# Patient Record
Sex: Male | Born: 1945 | Hispanic: Yes | Marital: Married | Smoking: Former smoker
Health system: Southern US, Community
[De-identification: ages and names within clinical notes are randomized; demographics above are authoritative.]

## PROBLEM LIST (undated history)

## (undated) DIAGNOSIS — T4145XA Adverse effect of unspecified anesthetic, initial encounter: Secondary | ICD-10-CM

## (undated) DIAGNOSIS — Z87442 Personal history of urinary calculi: Secondary | ICD-10-CM

## (undated) DIAGNOSIS — R350 Frequency of micturition: Secondary | ICD-10-CM

## (undated) DIAGNOSIS — Z87898 Personal history of other specified conditions: Secondary | ICD-10-CM

## (undated) DIAGNOSIS — H538 Other visual disturbances: Secondary | ICD-10-CM

## (undated) DIAGNOSIS — R569 Unspecified convulsions: Secondary | ICD-10-CM

## (undated) DIAGNOSIS — T8859XA Other complications of anesthesia, initial encounter: Secondary | ICD-10-CM

## (undated) DIAGNOSIS — E782 Mixed hyperlipidemia: Secondary | ICD-10-CM

## (undated) DIAGNOSIS — K219 Gastro-esophageal reflux disease without esophagitis: Secondary | ICD-10-CM

## (undated) DIAGNOSIS — R972 Elevated prostate specific antigen [PSA]: Secondary | ICD-10-CM

## (undated) DIAGNOSIS — M1990 Unspecified osteoarthritis, unspecified site: Secondary | ICD-10-CM

## (undated) DIAGNOSIS — E039 Hypothyroidism, unspecified: Secondary | ICD-10-CM

## (undated) DIAGNOSIS — R351 Nocturia: Secondary | ICD-10-CM

## (undated) DIAGNOSIS — E119 Type 2 diabetes mellitus without complications: Secondary | ICD-10-CM

## (undated) HISTORY — PX: COLONOSCOPY W/ POLYPECTOMY: SHX1380

## (undated) HISTORY — PX: TONSILLECTOMY: SUR1361

---

## 2004-02-12 ENCOUNTER — Ambulatory Visit: Payer: Self-pay | Admitting: Internal Medicine

## 2004-02-17 ENCOUNTER — Ambulatory Visit: Payer: Self-pay | Admitting: Internal Medicine

## 2004-02-17 ENCOUNTER — Ambulatory Visit: Payer: Self-pay | Admitting: *Deleted

## 2004-02-17 ENCOUNTER — Ambulatory Visit (HOSPITAL_COMMUNITY): Admission: RE | Admit: 2004-02-17 | Discharge: 2004-02-17 | Payer: Self-pay | Admitting: Internal Medicine

## 2004-03-02 ENCOUNTER — Ambulatory Visit: Payer: Self-pay | Admitting: Internal Medicine

## 2004-06-21 ENCOUNTER — Ambulatory Visit: Payer: Self-pay | Admitting: Internal Medicine

## 2004-06-28 ENCOUNTER — Ambulatory Visit: Payer: Self-pay | Admitting: Internal Medicine

## 2004-09-07 ENCOUNTER — Ambulatory Visit: Payer: Self-pay | Admitting: Internal Medicine

## 2004-11-09 ENCOUNTER — Ambulatory Visit: Payer: Self-pay | Admitting: Internal Medicine

## 2004-12-12 ENCOUNTER — Emergency Department (HOSPITAL_COMMUNITY): Admission: EM | Admit: 2004-12-12 | Discharge: 2004-12-12 | Payer: Self-pay | Admitting: Emergency Medicine

## 2004-12-21 ENCOUNTER — Ambulatory Visit: Payer: Self-pay | Admitting: Internal Medicine

## 2005-01-05 ENCOUNTER — Ambulatory Visit: Payer: Self-pay | Admitting: Internal Medicine

## 2005-01-11 ENCOUNTER — Ambulatory Visit: Payer: Self-pay | Admitting: Internal Medicine

## 2005-05-16 ENCOUNTER — Ambulatory Visit: Payer: Self-pay | Admitting: Internal Medicine

## 2005-12-29 ENCOUNTER — Ambulatory Visit: Payer: Self-pay | Admitting: Internal Medicine

## 2006-01-02 ENCOUNTER — Ambulatory Visit: Payer: Self-pay | Admitting: Internal Medicine

## 2007-01-31 ENCOUNTER — Telehealth (INDEPENDENT_AMBULATORY_CARE_PROVIDER_SITE_OTHER): Payer: Self-pay | Admitting: *Deleted

## 2007-02-01 ENCOUNTER — Telehealth (INDEPENDENT_AMBULATORY_CARE_PROVIDER_SITE_OTHER): Payer: Self-pay | Admitting: Internal Medicine

## 2007-02-04 DIAGNOSIS — M1990 Unspecified osteoarthritis, unspecified site: Secondary | ICD-10-CM | POA: Insufficient documentation

## 2007-02-04 DIAGNOSIS — E039 Hypothyroidism, unspecified: Secondary | ICD-10-CM | POA: Insufficient documentation

## 2007-02-04 DIAGNOSIS — M2550 Pain in unspecified joint: Secondary | ICD-10-CM | POA: Insufficient documentation

## 2007-02-11 ENCOUNTER — Ambulatory Visit: Payer: Self-pay | Admitting: Nurse Practitioner

## 2007-02-11 DIAGNOSIS — L80 Vitiligo: Secondary | ICD-10-CM

## 2007-02-11 LAB — CONVERTED CEMR LAB
ALT: 37 units/L (ref 0–53)
AST: 26 units/L (ref 0–37)
Albumin: 4.6 g/dL (ref 3.5–5.2)
Alkaline Phosphatase: 87 units/L (ref 39–117)
BUN: 15 mg/dL (ref 6–23)
Basophils Absolute: 0 10*3/uL (ref 0.0–0.1)
Basophils Relative: 0 % (ref 0–1)
CO2: 25 meq/L (ref 19–32)
Calcium: 9.2 mg/dL (ref 8.4–10.5)
Chloride: 105 meq/L (ref 96–112)
Cholesterol: 204 mg/dL — ABNORMAL HIGH (ref 0–200)
Creatinine, Ser: 0.87 mg/dL (ref 0.40–1.50)
Eosinophils Absolute: 0.1 10*3/uL (ref 0.0–0.7)
Eosinophils Relative: 2 % (ref 0–5)
Glucose, Bld: 79 mg/dL (ref 70–99)
HCT: 51 % (ref 39.0–52.0)
HDL: 33 mg/dL — ABNORMAL LOW (ref >39)
Hemoglobin: 16.1 g/dL (ref 13.0–17.0)
LDL Cholesterol: 121 mg/dL — ABNORMAL HIGH (ref 0–99)
Lymphocytes Relative: 34 % (ref 12–46)
Lymphs Abs: 2.3 10*3/uL (ref 0.7–3.3)
MCHC: 31.6 g/dL (ref 30.0–36.0)
MCV: 97.9 fL (ref 78.0–100.0)
Monocytes Absolute: 0.5 10*3/uL (ref 0.2–0.7)
Monocytes Relative: 7 % (ref 3–11)
Neutro Abs: 3.9 10*3/uL (ref 1.7–7.7)
Neutrophils Relative %: 57 % (ref 43–77)
Platelets: 238 10*3/uL (ref 150–400)
Potassium: 4.4 meq/L (ref 3.5–5.3)
RBC: 5.21 M/uL (ref 4.22–5.81)
RDW: 12.4 % (ref 11.5–14.0)
Sodium: 143 meq/L (ref 135–145)
TSH: 1.739 microintl units/mL (ref 0.350–5.50)
Total Bilirubin: 0.5 mg/dL (ref 0.3–1.2)
Total CHOL/HDL Ratio: 6.2
Total Protein: 7.2 g/dL (ref 6.0–8.3)
Triglycerides: 250 mg/dL — ABNORMAL HIGH (ref <150)
VLDL: 50 mg/dL — ABNORMAL HIGH (ref 0–40)
WBC: 6.7 10*3/uL (ref 4.0–10.5)

## 2007-02-18 ENCOUNTER — Ambulatory Visit: Payer: Self-pay | Admitting: Nurse Practitioner

## 2007-02-18 DIAGNOSIS — K219 Gastro-esophageal reflux disease without esophagitis: Secondary | ICD-10-CM

## 2007-02-18 LAB — CONVERTED CEMR LAB
Cholesterol, target level: 200 mg/dL
HDL goal, serum: 40 mg/dL
LDL Goal: 130 mg/dL

## 2007-03-04 ENCOUNTER — Telehealth (INDEPENDENT_AMBULATORY_CARE_PROVIDER_SITE_OTHER): Payer: Self-pay | Admitting: Nurse Practitioner

## 2007-10-11 ENCOUNTER — Encounter (INDEPENDENT_AMBULATORY_CARE_PROVIDER_SITE_OTHER): Payer: Self-pay | Admitting: Nurse Practitioner

## 2008-04-02 ENCOUNTER — Ambulatory Visit: Payer: Self-pay | Admitting: Nurse Practitioner

## 2008-04-02 DIAGNOSIS — L719 Rosacea, unspecified: Secondary | ICD-10-CM | POA: Insufficient documentation

## 2008-04-03 LAB — CONVERTED CEMR LAB
ALT: 51 units/L (ref 0–53)
AST: 35 units/L (ref 0–37)
Albumin: 5.2 g/dL (ref 3.5–5.2)
Alkaline Phosphatase: 87 units/L (ref 39–117)
BUN: 14 mg/dL (ref 6–23)
Basophils Absolute: 0 10*3/uL (ref 0.0–0.1)
Basophils Relative: 0 % (ref 0–1)
CO2: 28 meq/L (ref 19–32)
Calcium: 10 mg/dL (ref 8.4–10.5)
Chloride: 103 meq/L (ref 96–112)
Cholesterol: 253 mg/dL — ABNORMAL HIGH (ref 0–200)
Creatinine, Ser: 1.12 mg/dL (ref 0.40–1.50)
Eosinophils Absolute: 0.1 10*3/uL (ref 0.0–0.7)
Eosinophils Relative: 2 % (ref 0–5)
Glucose, Bld: 104 mg/dL — ABNORMAL HIGH (ref 70–99)
HCT: 49.6 % (ref 39.0–52.0)
HDL: 35 mg/dL — ABNORMAL LOW (ref >39)
Hemoglobin: 16.5 g/dL (ref 13.0–17.0)
LDL Cholesterol: 146 mg/dL — ABNORMAL HIGH (ref 0–99)
Lymphocytes Relative: 35 % (ref 12–46)
Lymphs Abs: 2.6 10*3/uL (ref 0.7–4.0)
MCHC: 33.3 g/dL (ref 30.0–36.0)
MCV: 94.8 fL (ref 78.0–100.0)
Monocytes Absolute: 0.5 10*3/uL (ref 0.1–1.0)
Monocytes Relative: 6 % (ref 3–12)
Neutro Abs: 4.1 10*3/uL (ref 1.7–7.7)
Neutrophils Relative %: 56 % (ref 43–77)
PSA: 2.08 ng/mL (ref 0.10–4.00)
Platelets: 211 10*3/uL (ref 150–400)
Potassium: 5.1 meq/L (ref 3.5–5.3)
RBC: 5.23 M/uL (ref 4.22–5.81)
RDW: 13.5 % (ref 11.5–15.5)
Sodium: 144 meq/L (ref 135–145)
TSH: 25.857 microintl units/mL — ABNORMAL HIGH (ref 0.350–4.50)
Total Bilirubin: 0.7 mg/dL (ref 0.3–1.2)
Total CHOL/HDL Ratio: 7.2
Total Protein: 8.1 g/dL (ref 6.0–8.3)
Triglycerides: 359 mg/dL — ABNORMAL HIGH (ref <150)
VLDL: 72 mg/dL — ABNORMAL HIGH (ref 0–40)
WBC: 7.3 10*3/uL (ref 4.0–10.5)

## 2008-08-06 ENCOUNTER — Encounter (INDEPENDENT_AMBULATORY_CARE_PROVIDER_SITE_OTHER): Payer: Self-pay | Admitting: Nurse Practitioner

## 2008-09-29 ENCOUNTER — Ambulatory Visit: Payer: Self-pay | Admitting: Internal Medicine

## 2008-09-29 DIAGNOSIS — R7309 Other abnormal glucose: Secondary | ICD-10-CM | POA: Insufficient documentation

## 2008-09-29 DIAGNOSIS — E782 Mixed hyperlipidemia: Secondary | ICD-10-CM

## 2008-09-30 ENCOUNTER — Telehealth (INDEPENDENT_AMBULATORY_CARE_PROVIDER_SITE_OTHER): Payer: Self-pay | Admitting: *Deleted

## 2008-10-01 ENCOUNTER — Encounter (INDEPENDENT_AMBULATORY_CARE_PROVIDER_SITE_OTHER): Payer: Self-pay | Admitting: Internal Medicine

## 2008-10-02 ENCOUNTER — Ambulatory Visit: Payer: Self-pay | Admitting: Internal Medicine

## 2008-10-02 LAB — CONVERTED CEMR LAB
Anti Nuclear Antibody(ANA): NEGATIVE
Rheumatoid fact SerPl-aCnc: <20 intl units/mL (ref 0–20)
Sed Rate: 8 mm/hr (ref 0–16)

## 2008-10-07 ENCOUNTER — Telehealth (INDEPENDENT_AMBULATORY_CARE_PROVIDER_SITE_OTHER): Payer: Self-pay | Admitting: Internal Medicine

## 2008-11-13 ENCOUNTER — Encounter (INDEPENDENT_AMBULATORY_CARE_PROVIDER_SITE_OTHER): Payer: Self-pay | Admitting: Internal Medicine

## 2008-11-30 ENCOUNTER — Ambulatory Visit: Payer: Self-pay | Admitting: Internal Medicine

## 2008-11-30 LAB — CONVERTED CEMR LAB
ALT: 37 units/L (ref 0–53)
AST: 22 units/L (ref 0–37)
Albumin: 4.4 g/dL (ref 3.5–5.2)
Alkaline Phosphatase: 93 units/L (ref 39–117)
BUN: 17 mg/dL (ref 6–23)
CO2: 25 meq/L (ref 19–32)
Calcium: 8.9 mg/dL (ref 8.4–10.5)
Chloride: 104 meq/L (ref 96–112)
Cholesterol: 171 mg/dL (ref 0–200)
Creatinine, Ser: 0.92 mg/dL (ref 0.40–1.50)
Glucose, Bld: 100 mg/dL — ABNORMAL HIGH (ref 70–99)
HDL: 36 mg/dL — ABNORMAL LOW (ref >39)
LDL Cholesterol: 103 mg/dL — ABNORMAL HIGH (ref 0–99)
Potassium: 4.2 meq/L (ref 3.5–5.3)
Sodium: 142 meq/L (ref 135–145)
TSH: 4.554 microintl units/mL — ABNORMAL HIGH (ref 0.350–4.500)
Total Bilirubin: 0.4 mg/dL (ref 0.3–1.2)
Total CHOL/HDL Ratio: 4.8
Total Protein: 7.1 g/dL (ref 6.0–8.3)
Triglycerides: 158 mg/dL — ABNORMAL HIGH (ref <150)
VLDL: 32 mg/dL (ref 0–40)

## 2008-12-08 ENCOUNTER — Ambulatory Visit: Payer: Self-pay | Admitting: Internal Medicine

## 2009-11-16 ENCOUNTER — Ambulatory Visit: Payer: Self-pay | Admitting: Nurse Practitioner

## 2009-11-16 DIAGNOSIS — K5900 Constipation, unspecified: Secondary | ICD-10-CM | POA: Insufficient documentation

## 2009-11-16 LAB — CONVERTED CEMR LAB: Blood Glucose, Fingerstick: 83

## 2009-11-17 ENCOUNTER — Ambulatory Visit: Payer: Self-pay | Admitting: Nurse Practitioner

## 2009-11-17 ENCOUNTER — Encounter (INDEPENDENT_AMBULATORY_CARE_PROVIDER_SITE_OTHER): Payer: Self-pay | Admitting: Internal Medicine

## 2009-11-17 LAB — CONVERTED CEMR LAB
ALT: 81 units/L — ABNORMAL HIGH (ref 0–53)
AST: 38 units/L — ABNORMAL HIGH (ref 0–37)
Albumin: 4.9 g/dL (ref 3.5–5.2)
Alkaline Phosphatase: 104 units/L (ref 39–117)
BUN: 14 mg/dL (ref 6–23)
Basophils Absolute: 0 10*3/uL (ref 0.0–0.1)
Basophils Relative: 0 % (ref 0–1)
CO2: 29 meq/L (ref 19–32)
Calcium: 9.2 mg/dL (ref 8.4–10.5)
Chloride: 104 meq/L (ref 96–112)
Cholesterol: 227 mg/dL — ABNORMAL HIGH (ref 0–200)
Creatinine, Ser: 0.93 mg/dL (ref 0.40–1.50)
Eosinophils Absolute: 0.3 10*3/uL (ref 0.0–0.7)
Eosinophils Relative: 4 % (ref 0–5)
Glucose, Bld: 95 mg/dL (ref 70–99)
HCT: 48.4 % (ref 39.0–52.0)
HCV Ab: NEGATIVE
HDL: 35 mg/dL — ABNORMAL LOW (ref >39)
Hemoglobin: 15.9 g/dL (ref 13.0–17.0)
Hep A Total Ab: POSITIVE — AB
Hep B Core Total Ab: NEGATIVE
Hep B S Ab: NEGATIVE
LDL Cholesterol: 151 mg/dL — ABNORMAL HIGH (ref 0–99)
Lymphocytes Relative: 34 % (ref 12–46)
Lymphs Abs: 2.5 10*3/uL (ref 0.7–4.0)
MCHC: 32.9 g/dL (ref 30.0–36.0)
MCV: 92.9 fL (ref 78.0–100.0)
Monocytes Absolute: 0.5 10*3/uL (ref 0.1–1.0)
Monocytes Relative: 7 % (ref 3–12)
Neutro Abs: 4 10*3/uL (ref 1.7–7.7)
Neutrophils Relative %: 56 % (ref 43–77)
PSA: 4.15 ng/mL — ABNORMAL HIGH (ref 0.10–4.00)
Platelets: 199 10*3/uL (ref 150–400)
Potassium: 4.7 meq/L (ref 3.5–5.3)
RBC: 5.21 M/uL (ref 4.22–5.81)
RDW: 13.8 % (ref 11.5–15.5)
Sodium: 143 meq/L (ref 135–145)
TSH: 0.452 microintl units/mL (ref 0.350–4.500)
Total Bilirubin: 0.6 mg/dL (ref 0.3–1.2)
Total CHOL/HDL Ratio: 6.5
Total Protein: 7.8 g/dL (ref 6.0–8.3)
Triglycerides: 203 mg/dL — ABNORMAL HIGH (ref <150)
VLDL: 41 mg/dL — ABNORMAL HIGH (ref 0–40)
WBC: 7.2 10*3/uL (ref 4.0–10.5)

## 2010-05-24 NOTE — Letter (Signed)
Summary: Handout Printed  Printed Handout:  - Diet - High-Fiber 

## 2010-05-24 NOTE — Assessment & Plan Note (Signed)
Summary: *lLAB RESULTS PER FNP MARTIN . NO CHARGE / NS  Nurse Visit  With benefit of an interpreter, explained and discussed lab results from yesterday's visit.   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  Was advised to stop taking the fluconazole, as his liver enzymes are elevated.   Explained to pt. that the samples of Trilipix 135 mg. are to take instead of the pravastatin, since his cholesterol is still high, but because of his elevated LFTs to stop the pravastatin.  States that he occasionally has trouble urinating and gets up at night sometimes, but has had no difficulty with his stream.  Advised that his PSA is elevated since 2009 and that his next visit will need a rectal/prostate exam, which can be done when he returns for an office visit and repeat LFTs in 6 weeks  -- verbalized understanding.   Dutch Quint RN  November 17, 2009 4:27 PM    Allergies: No Known Drug Allergies  Appended Document: *lLAB RESULTS PER FNP MARTIN . NO CHARGE / NS PSA was NOT elevated at last check in 2009  Appended Document: *lLAB RESULTS PER FNP MARTIN . NO CHARGE / NS Pt. was advised that PSA was normal in 2009 and that it has since elevated as per last lab result.  Dutch Quint RN  November 22, 2009 10:51 AM

## 2010-05-24 NOTE — Letter (Signed)
Summary: Handout Printed  Printed Handout:  - Diet - Low-Cholesterol Guidelines 

## 2010-05-24 NOTE — Letter (Signed)
Summary: Handout Printed  Printed Handout:  - Hypothyroidism 

## 2010-05-24 NOTE — Assessment & Plan Note (Signed)
Summary: Hypothroidism/Hypercholesterolemia   Vital Signs:  Patient profile:   65 year old male Height:      68.0 inches Weight:      208.0 pounds BMI:     31.74 Temp:     98.0 degrees F oral Pulse rate:   65 / minute Pulse rhythm:   regular Resp:     16 per minute BP sitting:   127 / 81  (left arm) Cuff size:   large  Vitals Entered By: Levon Hedger (November 16, 2009 10:06 AM) CC: pain in joints in his legs, feet, and back...labwork, Lipid Management Is Patient Diabetic? No Pain Assessment Patient in pain? yes     Location: joints CBG Result 83 CBG Device ID B  Does patient need assistance? Functional Status Self care Ambulation Normal Comments pt has not been taking pravastin for 2 months   CC:  pain in joints in his legs, feet, and back...labwork, and Lipid Management.  History of Present Illness:  Pt into the office for f/u - He has been in [place] with his son and returned to [facility] in April  At that time pt was not feeling well - tired, fatigued. He went to his son's provider in [facility] and presents today with the lab results. 08/02/2009 - TSH 0.2333 Pt decreased the levothyroid to and was to f/u in 3 months Pt chose to return to this office as he is most familiar with the providers here.  Language line used for Spanish interpreter  Lipid Management History:      Positive NCEP/ATP III risk factors include male age 38 years old or older and HDL cholesterol less than 40.  Negative NCEP/ATP III risk factors include non-diabetic, no family history for ischemic heart disease, non-tobacco-user status, non-hypertensive, no ASHD (atherosclerotic heart disease), no prior stroke/TIA, no peripheral vascular disease, and no history of aortic aneurysm.        The patient states that he does not know about the "Therapeutic Lifestyle Change" diet.  His compliance with the TLC diet is fair.  The patient does not know about adjunctive measures for cholesterol lowering.   Adjunctive measures started by the patient include ASA.  He expresses no side effects from his lipid-lowering medication.  Comments include: Pt has only took the cholesterol medication for 1 month then he stooped taking it.  The patient denies any symptoms to suggest myopathy or liver disease.  Comments: Labs done 08/02/2009 - total 203, trig 216, LDL 126, HDL 34 .   Allergies (verified): No Known Drug Allergies  Review of Systems General:  Denies fatigue, fever, and weakness. CV:  Denies chest pain or discomfort. Resp:  Denies cough. GI:  Complains of constipation; denies abdominal pain, nausea, and vomiting; abdominal distention toward the end of the day.  Reports 1 BM per day but with straining. Derm:  Complains of changes in color of skin; Hx of vitiligo also with hx of Rosacea for which he is requesting a refill of gel.  with the extreme heat face gets really hot and swollen.  Physical Exam  General:  alert.   Head:  normocephalic.   Lungs:  normal breath sounds.   Heart:  normal rate and regular rhythm.   Abdomen:  normal bowel sounds.   Msk:  up to the exam table Neurologic:  alert & oriented X3.   Skin:  vitiligo to bilateral hands Psych:  Oriented X3.     Impression & Recommendations:  Problem # 1:  HYPOTHYROIDISM (  ICD-244.9) will check labs today. Pt hyperthyroid in April when checked at another office  He has been taking levothroid by mouth daily  The following medications were removed from the medication list:    Levothroid 25 Mcg Tabs (Levothyroxine sodium) .Marland Kitchen... 1 tab with 200 micrograms tab daily His updated medication list for this problem includes:    Levothroid 200 Mcg Tabs (Levothyroxine sodium) .Marland Kitchen... 1 tab by mouth daily for thyroid  Orders: T-TSH (16606-30160)  Problem # 2:  HYPERLIPIDEMIA, MIXED (ICD-272.2) will check labs today  expect that labs will be high because pt has not been taking medications as ordered reviewed with pt cholesterol and  importance of taking meds His updated medication list for this problem includes:    Pravastatin Sodium 20 Mg Tabs (Pravastatin sodium) .Marland Kitchen... 1 tablet by mouth nightly for cholesterol  Orders: T-Lipid Profile (10932-35573) T-Comprehensive Metabolic Panel (22025-42706) T-PSA (23762-83151) T-CBC w/Diff (76160-73710)  Problem # 3:  HYPERGLYCEMIA (ICD-790.29) stable Orders: Capillary Blood Glucose/CBG (62694)  Problem # 4:  ROSACEA (ICD-695.3) will refill gel for use to face  Problem # 5:  CONSTIPATION (ICD-564.00) advised pt on foods to start to keep stools soft pt had colonscopy 8 years ago in [place] during which time Polyps were removed.  pt is interested in having this procedure done again and will notify this office when he is ready to proceed  Complete Medication List: 1)  Levothroid 200 Mcg Tabs (Levothyroxine sodium) .Marland Kitchen.. 1 tab by mouth daily for thyroid 2)  Naproxen 500 Mg Tabs (Naproxen) .Marland Kitchen.. 1 tablet by mouth two times a day as needed for ankles 3)  Metronidazole 0.75 % Crea (Metronidazole) .... Apply to affected area two times a day 4)  Pravastatin Sodium 20 Mg Tabs (Pravastatin sodium) .Marland Kitchen.. 1 tablet by mouth nightly for cholesterol 5)  Triamcinolone Acetonide 0.1 % Oint (Triamcinolone acetonide) .... One application topically to affected area daily  Lipid Assessment/Plan:      Based on NCEP/ATP III, the patient's risk factor category is "2 or more risk factors and a calculated 10 year CAD risk of < 20%".  The patient's lipid goals are as follows: Total cholesterol goal is 200; LDL cholesterol goal is 130; HDL cholesterol goal is 40; Triglyceride goal is 150.    Patient Instructions: 1)  Return tomorrow with an appointment with Aggie Cosier for review of lab results. (NO CHARGE FOR PATIENT) 2)  Once labs are reviewed you can get your refills and will determine when your follow up appointment will be 3)  Constipation - read handout and start eating high fiber foods. 4)  Notify this  office when you are ready to schedule your colonscopy. Prescriptions: METRONIDAZOLE 0.75 % CREA (METRONIDAZOLE) apply to affected area two times a day  #45gm x 6   Entered and Authorized by:   Lehman Prom FNP   Signed by:   Lehman Prom FNP on 11/16/2009   Method used:   Print then Give to Patient   RxID:   [id number]

## 2012-07-10 ENCOUNTER — Other Ambulatory Visit: Payer: Self-pay | Admitting: Urology

## 2012-07-16 ENCOUNTER — Encounter (HOSPITAL_BASED_OUTPATIENT_CLINIC_OR_DEPARTMENT_OTHER): Payer: Self-pay | Admitting: *Deleted

## 2012-07-16 NOTE — Progress Notes (Signed)
SPOKE W/ PT THRU SPANISH INTERPRETOR , TONY BASTIAS. NPO AFTER MN. ARRIVES AT 0715. NEEDS HG. WILL TAKE SYNTHROID AM OF SURG W/ SIP OF WATER AND DO FLEET ENEMA. ARRANGED FOR SPANISH INTERPRETOR DOS THRU CARE MANAGEMENT  [phone].

## 2012-07-19 ENCOUNTER — Ambulatory Visit (HOSPITAL_BASED_OUTPATIENT_CLINIC_OR_DEPARTMENT_OTHER)
Admission: RE | Admit: 2012-07-19 | Discharge: 2012-07-19 | Disposition: A | Discharge status: Home / Self Care | Payer: Medicare Other | Source: Ambulatory Visit | Attending: Urology | Admitting: Urology

## 2012-07-19 ENCOUNTER — Encounter (HOSPITAL_BASED_OUTPATIENT_CLINIC_OR_DEPARTMENT_OTHER)
Admission: RE | Disposition: A | Discharge status: Home / Self Care | Payer: Self-pay | Source: Ambulatory Visit | Attending: Urology

## 2012-07-19 ENCOUNTER — Ambulatory Visit (HOSPITAL_BASED_OUTPATIENT_CLINIC_OR_DEPARTMENT_OTHER): Payer: Medicare Other | Admitting: Anesthesiology

## 2012-07-19 ENCOUNTER — Encounter (HOSPITAL_BASED_OUTPATIENT_CLINIC_OR_DEPARTMENT_OTHER): Payer: Self-pay | Admitting: Anesthesiology

## 2012-07-19 DIAGNOSIS — R3915 Urgency of urination: Secondary | ICD-10-CM | POA: Insufficient documentation

## 2012-07-19 DIAGNOSIS — K219 Gastro-esophageal reflux disease without esophagitis: Secondary | ICD-10-CM | POA: Insufficient documentation

## 2012-07-19 DIAGNOSIS — R350 Frequency of micturition: Secondary | ICD-10-CM | POA: Insufficient documentation

## 2012-07-19 DIAGNOSIS — C61 Malignant neoplasm of prostate: Secondary | ICD-10-CM | POA: Insufficient documentation

## 2012-07-19 DIAGNOSIS — E039 Hypothyroidism, unspecified: Secondary | ICD-10-CM | POA: Insufficient documentation

## 2012-07-19 DIAGNOSIS — E782 Mixed hyperlipidemia: Secondary | ICD-10-CM | POA: Insufficient documentation

## 2012-07-19 DIAGNOSIS — Z79899 Other long term (current) drug therapy: Secondary | ICD-10-CM | POA: Insufficient documentation

## 2012-07-19 HISTORY — DX: Frequency of micturition: R35.0

## 2012-07-19 HISTORY — DX: Nocturia: R35.1

## 2012-07-19 HISTORY — DX: Mixed hyperlipidemia: E78.2

## 2012-07-19 HISTORY — DX: Unspecified osteoarthritis, unspecified site: M19.90

## 2012-07-19 HISTORY — PX: PROSTATE BIOPSY: SHX241

## 2012-07-19 HISTORY — DX: Elevated prostate specific antigen (PSA): R97.20

## 2012-07-19 HISTORY — DX: Hypothyroidism, unspecified: E03.9

## 2012-07-19 SURGERY — BIOPSY, PROSTATE, RECTAL APPROACH, WITH US GUIDANCE
Anesthesia: Monitor Anesthesia Care | Site: Prostate | Wound class: Clean Contaminated

## 2012-07-19 MED ORDER — PROPOFOL 10 MG/ML IV EMUL
INTRAVENOUS | Status: DC | PRN
  Administered 2012-07-19: 100 ug/kg/min via INTRAVENOUS

## 2012-07-19 MED ORDER — HYDROMORPHONE HCL PF 1 MG/ML IJ SOLN
0.2500 mg | INTRAMUSCULAR | Status: DC | PRN
  Filled 2012-07-19: qty 1

## 2012-07-19 MED ORDER — FENTANYL CITRATE 0.05 MG/ML IJ SOLN
INTRAMUSCULAR | Status: DC | PRN
  Administered 2012-07-19 (×2): 25 ug via INTRAVENOUS

## 2012-07-19 MED ORDER — LIDOCAINE HCL (PF) 2 % IJ SOLN
INTRAMUSCULAR | Status: DC | PRN
  Administered 2012-07-19: 7 mL

## 2012-07-19 MED ORDER — FLEET ENEMA 7-19 GM/118ML RE ENEM
1.0000 | ENEMA | Freq: Once | RECTAL | Status: DC
  Filled 2012-07-19: qty 1

## 2012-07-19 MED ORDER — DEXAMETHASONE SODIUM PHOSPHATE 4 MG/ML IJ SOLN
INTRAMUSCULAR | Status: DC | PRN
  Administered 2012-07-19 (×2): 5 mg via INTRAVENOUS

## 2012-07-19 MED ORDER — LACTATED RINGERS IV SOLN
INTRAVENOUS | Status: DC
  Administered 2012-07-19: 100 mL/h via INTRAVENOUS
  Filled 2012-07-19: qty 1000

## 2012-07-19 MED ORDER — DEXTROSE 5 % IV SOLN
1.0000 g | Freq: Once | INTRAVENOUS | Status: DC
  Filled 2012-07-19: qty 10

## 2012-07-19 MED ORDER — LIDOCAINE HCL (CARDIAC) 20 MG/ML IV SOLN
INTRAVENOUS | Status: DC | PRN
  Administered 2012-07-19: 50 mg via INTRAVENOUS

## 2012-07-19 MED ORDER — ONDANSETRON HCL 4 MG/2ML IJ SOLN
INTRAMUSCULAR | Status: DC | PRN
  Administered 2012-07-19 (×2): 2 mg via INTRAVENOUS

## 2012-07-19 MED ORDER — MIDAZOLAM HCL 5 MG/5ML IJ SOLN
INTRAMUSCULAR | Status: DC | PRN
  Administered 2012-07-19: 2 mg via INTRAVENOUS

## 2012-07-19 MED ORDER — PROMETHAZINE HCL 25 MG/ML IJ SOLN
6.2500 mg | INTRAMUSCULAR | Status: DC | PRN
  Filled 2012-07-19: qty 1

## 2012-07-19 MED ORDER — PROPOFOL 10 MG/ML IV BOLUS
INTRAVENOUS | Status: DC | PRN
  Administered 2012-07-19: 20 mg via INTRAVENOUS
  Administered 2012-07-19: 30 mg via INTRAVENOUS

## 2012-07-19 MED ORDER — LACTATED RINGERS IV SOLN
INTRAVENOUS | Status: DC
  Filled 2012-07-19: qty 1000

## 2012-07-19 MED ORDER — DEXTROSE 5 % IV SOLN
2.0000 g | Freq: Once | INTRAVENOUS | Status: AC
  Administered 2012-07-19: 2 g via INTRAVENOUS
  Filled 2012-07-19: qty 2

## 2012-07-19 MED ORDER — LACTATED RINGERS IV SOLN
INTRAVENOUS | Status: DC | PRN
  Administered 2012-07-19: 07:00:00 via INTRAVENOUS

## 2012-07-19 MED ORDER — OXYCODONE-ACETAMINOPHEN 5-325 MG PO TABS
1.0000 | ORAL_TABLET | ORAL | Status: DC | PRN
  Filled 2012-07-19: qty 2

## 2012-07-19 SURGICAL SUPPLY — 6 items
DRESSING TELFA 8X3 (GAUZE/BANDAGES/DRESSINGS) ×2 IMPLANT
GLOVE BIO SURGEON STRL SZ7 (GLOVE) ×1 IMPLANT
GLOVE SURG SS PI 8.0 STRL IVOR (GLOVE) IMPLANT
SURGILUBE 2OZ TUBE FLIPTOP (MISCELLANEOUS) ×2 IMPLANT
TOWEL OR 17X24 6PK STRL BLUE (TOWEL DISPOSABLE) ×2 IMPLANT
UNDERPAD 30X30 INCONTINENT (UNDERPADS AND DIAPERS) ×2 IMPLANT

## 2012-07-19 NOTE — Anesthesia Postprocedure Evaluation (Signed)
Anesthesia Post Note  Patient: Geoffrey Neal  Procedure(s) Performed: Procedure(s) (LRB): EUA, PROSTATE BIOPSY TRANSRECTAL ULTRASONIC PROSTATE (TUBP) (N/A)  Anesthesia type: General  Patient location: PACU  Post pain: Pain level controlled  Post assessment: Post-op Vital signs reviewed  Last Vitals:  Filed Vitals:   07/19/12 0945  BP: 136/99  Pulse: 79  Temp:   Resp: 13    Post vital signs: Reviewed  Level of consciousness: sedated  Complications: No apparent anesthesia complications

## 2012-07-19 NOTE — Op Note (Signed)
Preoperative diagnosis: Elevated PSA Postoperative diagnosis: Elevated PSA  Procedure: Transrectal ultrasound of the prostate Prostate needle biopsy Ultrasound guidance for needle biopsy  Surgeon: Bobetta Korf  Type of anesthesia: MAC  Findings: On digital rectal exam the prostate was mildly enlarged but palpably normal without hard area or nodule. The prostate was smooth. There were no rectal masses palpated.  Prostate ultrasound - prostate was normal in appearance with a homogenous appearance. There were some faint calcifications around the urethra and ejaculatory ducts. Length 3.77 cm Height 2.74 cm With the 4.49 cm Volume 24 g  Description of procedure - After consent was obtained the patient was brought to the operating room. After adequate anesthesia he was placed in left lateral decubitus with the right side up. The knees were pulled toward the chest. A timeout was performed to confirm the patient and procedure. A digital rectal exam was performed. The ultrasound probe was inserted and a prostate ultrasound obtained.  Next the needle was guided in and 12 core biopsy was taken standard pattern left and right base, mid, apex starting lateral then medial.  The patient was then placed supine awakened and taken to the recovery room in stable condition.  Complications: None  Blood loss: Minimal  Specimens: 12 core prostate biopsy sent to pathology individually labeled.  Drains: None  Disposition: Patient stable to PACU.

## 2012-07-19 NOTE — Anesthesia Preprocedure Evaluation (Signed)
Anesthesia Evaluation  Patient identified by MRN, date of birth, ID band Patient awake    Reviewed: Allergy & Precautions, H&P , NPO status , Patient's Chart, lab work & pertinent test results  Airway Mallampati: III TM Distance: >3 FB Neck ROM: Full    Dental  (+) Poor Dentition, Partial Upper, Partial Lower, Dental Advisory Given and Chipped   Pulmonary neg pulmonary ROS,  breath sounds clear to auscultation  Pulmonary exam normal       Cardiovascular negative cardio ROS  Rhythm:Regular Rate:Normal     Neuro/Psych negative neurological ROS  negative psych ROS   GI/Hepatic Neg liver ROS, GERD-  ,  Endo/Other  Hypothyroidism   Renal/GU negative Renal ROS  negative genitourinary   Musculoskeletal negative musculoskeletal ROS (+)   Abdominal   Peds negative pediatric ROS (+)  Hematology negative hematology ROS (+)   Anesthesia Other Findings   Reproductive/Obstetrics negative OB ROS                           Anesthesia Physical Anesthesia Plan  ASA: II  Anesthesia Plan: General   Post-op Pain Management:    Induction: Intravenous  Airway Management Planned: LMA  Additional Equipment:   Intra-op Plan:   Post-operative Plan: Extubation in OR  Informed Consent: I have reviewed the patients History and Physical, chart, labs and discussed the procedure including the risks, benefits and alternatives for the proposed anesthesia with the patient or authorized representative who has indicated his/her understanding and acceptance.   Dental advisory given  Plan Discussed with: CRNA  Anesthesia Plan Comments:         Anesthesia Quick Evaluation

## 2012-07-19 NOTE — Transfer of Care (Signed)
Immediate Anesthesia Transfer of Care Note  Patient: Geoffrey Neal  Procedure(s) Performed: Procedure(s) (LRB): EUA, PROSTATE BIOPSY TRANSRECTAL ULTRASONIC PROSTATE (TUBP) (N/A)  Patient Location: PACU  Anesthesia Type: MAC  Level of Consciousness: sleepy  Airway & Oxygen Therapy: Patient Spontanous Breathing and Patient connected to face mask oxygen, oral airway placed  Post-op Assessment: Report given to PACU RN and Post -op Vital signs reviewed and stable  Post vital signs: Reviewed and stable  Complications: No apparent anesthesia complications

## 2012-07-19 NOTE — H&P (Signed)
H&P   History of Present Illness: Geoffrey Neal present with an elevated PSA and a high PSAD with a 26 g prostate on prior US. He did not tolerate US probe insertion in office despite multiple attempts.   He also has urinary frequency and urgency and desires treatment.   Past Medical History  Diagnosis Date  . Elevated PSA   . Frequency of urination   . Mixed hyperlipidemia     MILD PER PT  . Nocturia   . DJD (degenerative joint disease)   . Hypothyroidism    History reviewed. No pertinent past surgical history.  Home Medications:  Prescriptions prior to admission  Medication Sig Dispense Refill  . levofloxacin (LEVAQUIN) 500 MG tablet Take 500 mg by mouth daily.      Marland Kitchen levothyroxine (SYNTHROID, LEVOTHROID) 175 MCG tablet Take 175 mcg by mouth every morning.      . Misc Natural Products (OSTEO BI-FLEX TRIPLE STRENGTH PO) Take by mouth daily.      . Omega-3 Fatty Acids (FISH OIL PO) Take by mouth daily.      . tamsulosin (FLOMAX) 0.4 MG CAPS Take 0.4 mg by mouth daily.       Allergies: No Known Allergies  History reviewed. No pertinent family history. Social History:  reports that he has quit smoking. His smoking use included Cigarettes. He smoked 0.00 packs per day for 15 years. He has never used smokeless tobacco. He reports that he does not drink alcohol or use illicit drugs.  ROS: A complete review of systems was performed.  All systems are negative except for pertinent findings as noted. @ROS @   Physical Exam:  Vital signs in last 24 hours: Temp:  [97.5 F (36.4 C)] 97.5 F (36.4 C) (03/28 0744) Pulse Rate:  [70] 70 (03/28 0744) Resp:  [20] 20 (03/28 0744) BP: (145)/(97) 145/97 mmHg (03/28 0744) SpO2:  [95 %] 95 % (03/28 0744) Weight:  [93.583 kg (206 lb 5 oz)] 93.583 kg (206 lb 5 oz) (03/28 0744) General:  Alert and oriented, No acute distress HEENT: Normocephalic, atraumatic Neck: No JVD or lymphadenopathy Cardiovascular: Regular rate and rhythm Lungs: Regular  rate and effort Abdomen: Soft, nontender, nondistended, no abdominal masses Back: No CVA tenderness Extremities: No edema Neurologic: Grossly intact  Laboratory Data:  Results for orders placed during the hospital encounter of 07/19/12 (from the past 24 hour(s))  POCT HEMOGLOBIN-HEMACUE     Status: None   Collection Time    07/19/12  7:51 AM      Result Value Range   Hemoglobin 14.0  13.0 - 17.0 g/dL   No results found for this or any previous visit (from the past 240 hour(s)). Creatinine: No results found for this basename: CREATININE,  in the last 168 hours  Impression/Assessment:  Elevated PSA Urinary frequency and urgency  Plan:  I discussed with the patient the nature, potential benefits, risks and alternatives to TRUS prostate and prostate biopsy, including side effects of the proposed treatment, the likelihood of the patient achieving the goals of the procedure, and any potential problems that might occur during the procedure or recuperation. All questions answered. Patient elects to proceed.   In regards to his LUTS, we discussed the nature, R/B of cystoscopy and the role of cystoscopy in the evaluation of LUTS. I recommended he undergo flexible cystoscopy which we normally perform in the office, but he can take advantage of the sedation and have it now. He elects NOT to proceed with cystoscopy.  Geoffrey Neal 07/19/2012, 8:26 AM

## 2012-07-22 ENCOUNTER — Encounter (HOSPITAL_BASED_OUTPATIENT_CLINIC_OR_DEPARTMENT_OTHER): Payer: Self-pay | Admitting: Urology

## 2013-08-17 ENCOUNTER — Emergency Department (HOSPITAL_BASED_OUTPATIENT_CLINIC_OR_DEPARTMENT_OTHER)
Admission: EM | Admit: 2013-08-17 | Discharge: 2013-08-18 | Disposition: A | Discharge status: Home / Self Care | Payer: Medicare Other | Attending: Emergency Medicine | Admitting: Emergency Medicine

## 2013-08-17 ENCOUNTER — Encounter (HOSPITAL_BASED_OUTPATIENT_CLINIC_OR_DEPARTMENT_OTHER): Payer: Self-pay | Admitting: Emergency Medicine

## 2013-08-17 ENCOUNTER — Emergency Department (HOSPITAL_BASED_OUTPATIENT_CLINIC_OR_DEPARTMENT_OTHER): Payer: Medicare Other

## 2013-08-17 DIAGNOSIS — Z792 Long term (current) use of antibiotics: Secondary | ICD-10-CM | POA: Insufficient documentation

## 2013-08-17 DIAGNOSIS — Z8739 Personal history of other diseases of the musculoskeletal system and connective tissue: Secondary | ICD-10-CM | POA: Insufficient documentation

## 2013-08-17 DIAGNOSIS — R5383 Other fatigue: Secondary | ICD-10-CM

## 2013-08-17 DIAGNOSIS — Z79899 Other long term (current) drug therapy: Secondary | ICD-10-CM | POA: Insufficient documentation

## 2013-08-17 DIAGNOSIS — E039 Hypothyroidism, unspecified: Secondary | ICD-10-CM | POA: Insufficient documentation

## 2013-08-17 DIAGNOSIS — Z87891 Personal history of nicotine dependence: Secondary | ICD-10-CM | POA: Insufficient documentation

## 2013-08-17 DIAGNOSIS — R5381 Other malaise: Secondary | ICD-10-CM | POA: Insufficient documentation

## 2013-08-17 LAB — CBC WITH DIFFERENTIAL/PLATELET
BASOS ABS: 0 10*3/uL (ref 0.0–0.1)
BASOS PCT: 1 % (ref 0–1)
EOS PCT: 4 % (ref 0–5)
Eosinophils Absolute: 0.2 10*3/uL (ref 0.0–0.7)
HEMATOCRIT: 44.5 % (ref 39.0–52.0)
Hemoglobin: 15.3 g/dL (ref 13.0–17.0)
Lymphocytes Relative: 35 % (ref 12–46)
Lymphs Abs: 2.2 10*3/uL (ref 0.7–4.0)
MCH: 31.4 pg (ref 26.0–34.0)
MCHC: 34.4 g/dL (ref 30.0–36.0)
MCV: 91.2 fL (ref 78.0–100.0)
MONO ABS: 0.7 10*3/uL (ref 0.1–1.0)
Monocytes Relative: 11 % (ref 3–12)
Neutro Abs: 3.1 10*3/uL (ref 1.7–7.7)
Neutrophils Relative %: 50 % (ref 43–77)
Platelets: 180 10*3/uL (ref 150–400)
RBC: 4.88 MIL/uL (ref 4.22–5.81)
RDW: 12.7 % (ref 11.5–15.5)
WBC: 6.2 10*3/uL (ref 4.0–10.5)

## 2013-08-17 LAB — COMPREHENSIVE METABOLIC PANEL
ALBUMIN: 4.1 g/dL (ref 3.5–5.2)
ALT: 54 U/L — ABNORMAL HIGH (ref 0–53)
AST: 34 U/L (ref 0–37)
Alkaline Phosphatase: 127 U/L — ABNORMAL HIGH (ref 39–117)
BUN: 21 mg/dL (ref 6–23)
CALCIUM: 9.7 mg/dL (ref 8.4–10.5)
CO2: 28 mEq/L (ref 19–32)
CREATININE: 1 mg/dL (ref 0.50–1.35)
Chloride: 103 mEq/L (ref 96–112)
GFR calc Af Amer: 86 mL/min — ABNORMAL LOW (ref >90)
GFR, EST NON AFRICAN AMERICAN: 74 mL/min — AB (ref >90)
Glucose, Bld: 138 mg/dL — ABNORMAL HIGH (ref 70–99)
Potassium: 4.7 mEq/L (ref 3.7–5.3)
Sodium: 143 mEq/L (ref 137–147)
Total Bilirubin: 0.3 mg/dL (ref 0.3–1.2)
Total Protein: 7.2 g/dL (ref 6.0–8.3)

## 2013-08-17 LAB — URINALYSIS, ROUTINE W REFLEX MICROSCOPIC
Bilirubin Urine: NEGATIVE
GLUCOSE, UA: NEGATIVE mg/dL
HGB URINE DIPSTICK: NEGATIVE
Ketones, ur: NEGATIVE mg/dL
LEUKOCYTES UA: NEGATIVE
Nitrite: NEGATIVE
Protein, ur: NEGATIVE mg/dL
SPECIFIC GRAVITY, URINE: 1.019 (ref 1.005–1.030)
UROBILINOGEN UA: 0.2 mg/dL (ref 0.0–1.0)
pH: 6.5 (ref 5.0–8.0)

## 2013-08-17 LAB — TROPONIN I

## 2013-08-17 NOTE — ED Notes (Signed)
C/o headache for several days, worse today.  C/o fatigue.  Denies chest pain, vomiting, diarrhea.

## 2013-08-17 NOTE — ED Notes (Signed)
Urine turned into lab at initial triage, gave order to phylis in lab just now.

## 2013-08-17 NOTE — ED Provider Notes (Signed)
CSN: [id number]     Arrival date & time 08/17/13  2124 History   First MD Initiated Contact with Patient 08/17/13 2150     Chief Complaint  Patient presents with  . Headache     (Consider location/radiation/quality/duration/timing/severity/associated sxs/prior Treatment) Patient is a 68 y.o. male presenting with weakness. The history is provided by the patient. No language interpreter was used.  Weakness This is a new problem. The current episode started in the past 7 days. Associated symptoms include fatigue, headaches and weakness. Pertinent negatives include no abdominal pain, chest pain, chills, congestion, coughing, diaphoresis, fever, myalgias, nausea or vomiting. Associated symptoms comments: For the past 4 days he has felt generally weak, wanting to sleep all the time. He has symptoms of headache and reports the headache is like previous symptoms he has when his blood pressure is elevated and is not new. No vomiting, fever, pain, dysuria, cough or SOB. He has a feeling in his chest he can only describe as "a feeling of anguish" and adamantly denies chest pain. It does not affect his breathing. He had a friend check his blood pressure 2 days ago and reports that it was elevated. He presents tonight with concern that the fatigue and weakness he feels is due to his blood pressure..    Past Medical History  Diagnosis Date  . Elevated PSA   . Frequency of urination   . Mixed hyperlipidemia     MILD PER PT  . Nocturia   . DJD (degenerative joint disease)   . Hypothyroidism    Past Surgical History  Procedure Laterality Date  . Prostate biopsy N/A 07/19/2012    Procedure: EUA, PROSTATE BIOPSY TRANSRECTAL ULTRASONIC PROSTATE (TUBP);  Surgeon: Fredricka Bonine, MD;  Location: [facility];  Service: Urology;  Laterality: N/A;   No family history on file. History  Substance Use Topics  . Smoking status: Former Smoker -- 15 years    Types: Cigarettes  . Smokeless  tobacco: Never Used     Comment: QUIT SMOKING 32 YRS AGO--  APPROX. 1980'S  . Alcohol Use: No    Review of Systems  Constitutional: Positive for fatigue. Negative for fever, chills and diaphoresis.  HENT: Negative for congestion and sinus pressure.   Respiratory: Negative.  Negative for cough and shortness of breath.   Cardiovascular: Negative.  Negative for chest pain.  Gastrointestinal: Negative.  Negative for nausea, vomiting and abdominal pain.  Genitourinary: Negative.  Negative for dysuria.  Musculoskeletal: Negative.  Negative for myalgias.  Skin: Negative.   Neurological: Positive for weakness and headaches.      Allergies  Review of patient's allergies indicates no known allergies.  Home Medications   Prior to Admission medications   Medication Sig Start Date End Date Taking? Authorizing Provider  Specialty Vitamins Products (MAGNESIUM, AMINO ACID CHELATE,) 133 MG tablet Take 1 tablet by mouth 2 (two) times daily.   Yes Historical Provider, MD  levofloxacin (LEVAQUIN) 500 MG tablet Take 500 mg by mouth daily.    Historical Provider, MD  levothyroxine (SYNTHROID, LEVOTHROID) 175 MCG tablet Take 175 mcg by mouth every morning.    Historical Provider, MD  Misc Natural Products (OSTEO BI-FLEX TRIPLE STRENGTH PO) Take by mouth daily.    Historical Provider, MD  Omega-3 Fatty Acids (FISH OIL PO) Take by mouth daily.    Historical Provider, MD  tamsulosin (FLOMAX) 0.4 MG CAPS Take 0.4 mg by mouth daily.    Historical Provider, MD   BP 183/90  Pulse 68  Temp(Src) 98.5 F (36.9 C) (Oral)  Ht 5\' 7"  (1.702 m)  Wt 207 lb (93.895 kg)  BMI 32.41 kg/m2  SpO2 95% Physical Exam  Constitutional: He is oriented to person, place, and time. He appears well-developed and well-nourished.  HENT:  Head: Normocephalic.  Neck: Normal range of motion. Neck supple.  Cardiovascular: Normal rate and regular rhythm.   No carotid bruit  Pulmonary/Chest: Effort normal and breath sounds normal.   Abdominal: Soft. Bowel sounds are normal. There is no tenderness. There is no rebound and no guarding.  Musculoskeletal: Normal range of motion.  Neurological: He is alert and oriented to person, place, and time.  Skin: Skin is warm and dry. No rash noted.  Psychiatric: He has a normal mood and affect.    ED Course  Procedures (including critical care time) Labs Review Labs Reviewed  CBC WITH DIFFERENTIAL  COMPREHENSIVE METABOLIC PANEL  URINALYSIS, ROUTINE W REFLEX MICROSCOPIC  TROPONIN I    Imaging Review No results found.   EKG Interpretation   Date/Time:  Sunday August 17 2013 22:41:24 EDT Ventricular Rate:  61 PR Interval:  176 QRS Duration: 88 QT Interval:  400 QTC Calculation: 402 R Axis:   17 Text Interpretation:  Normal sinus rhythm Normal ECG since last tracing no  significant change Confirmed by BELFI  MD, MELANIE ([id number]) on 08/17/2013  10:45:43 PM      MDM   Final diagnoses:  None    1. Fatigue  Lab studies reassuring. Exam is unremarkable for anything abnormal. He is awake, alert. Blood pressure is only slightly elevated. He is evaluated by Dr. Tamera Punt. Stable for discharge home and encouraged to follow up with PCP tomorrow.     Dewaine Oats, PA-C 08/18/13 0003

## 2013-08-17 NOTE — ED Notes (Signed)
Patient will need an interpretor to obtain more detailed history of symptoms.

## 2013-08-18 NOTE — Discharge Instructions (Signed)
Fatiga  (Fatigue)  Fatiga es la sensación de cansancio, falta de energía, falta de motivación, o sensación permanente de agotamiento. Si descansa lo suficiente, se alimenta bien y reduce las situaciones de estrés, disminuirá la fatiga. Consulte con su médico si esto persiste. La naturaleza de su fatiga indicará a su médico cuál es la causa. El tratamiento se implementa según cuál sea la causa.   CAUSAS  Hay muchas causas de fatiga. La mayoría de las veces puede hallarse en uno o más de sus hábitos o rutinas. Gran parte de las causas de fatiga pueden incluirse en una o más de tres áreas generales: Ellas son:  Problemas en el estilo de vida  · Trastornos del sueño.  · Trabajar demasiado.  · Agotamiento físico.  · Hábitos no saludables.  · Malos hábitos alimenticios o trastornos de alimentación.  · Uso de alcohol y/o droga.  · Falta de nutrición adecuada (desnutrición).  Problemas psicólogos  · Problemas de estrés y/o ansiedad.  · Depresión.  · Duelo.  · Aburrimiento.  Trastornos o problemas médicos  · Anemia.  · Embarazo.  · Problemas en la glándula tiroides.  · Recuperación de una cirugía mayor.  · Dolores continuos.  · Enfisema o asma no controladas adecuadamente.  · Problemas alérgicos.  · Diabetes.  · Infecciones (como la mononucleosis).  · Obesidad.  · Trastornos del sueño, como apnea del sueño.  · Insuficiencia cardiaca u otros problemas relacionados con el corazón.  · Cáncer.  · Enfermedad del riñón.  · Enfermedad hepática.  · Efectos de ciertos medicamentos como los antihistamínicos, medicamentos para la tos y el resfrío, analgésicos prescriptos, medicamentos para la hipertensión arterial y el corazón, medicamentos utilizados para el tratamiento de cáncer y algunos antidepresivos.  SÍNTOMAS  Los síntomas de fatiga son:   · Falta de energía.  · Falta de motivación.  · Somnolencia.  · Sensación de indiferencia hacia el entorno.  DIAGNÓSTICO  Los detalles de cómo usted siente guían a su médico para diagnosticar  la fatiga. Le preguntará sobre su estado de salud presente y pasado. Esto es importante para revisar todos los medicamentos que usted toma, incluyendo los recetados y los no recetados. Le realizará un examen físico exhaustivo. Le preguntará acerca de sus sentimientos, hábitos y estilo de vida normal. Su médico puede indicar análisis de sangre, de orina u otras pruebas para buscar las causas más comunes de la fatiga.   TRATAMIENTO  La fatiga se trata corrigiendo la causa subyacente. Por ejemplo, si usted tiene dolor continuo o depresión, el tratamiento de estas causas mejorará el problema. De mismo modo, al ajustar la dosis de ciertos medicamentos ayudará a reducir la fatiga.   INSTRUCCIONES PARA EL CUIDADO DOMICILIARIO  · Trate de dormir lo suficiente todas las noches.  · Mantenga una dieta sana y nutritiva, y beba suficiente agua durante el día.  · Practique modos de relajarse (como el yoga o la meditación).  · Haga ejercicios regularmente.  · Haga planes para cambiar las situaciones que causan estrés. Haga esos planes de manera que las tensiones disminuyan a lo largo del tiempo. Mantenga su rutina personal y laboral dentro de límites razonables.  · Evite las drogas de la calle y minimice el consumo de alcohol.  · Comience a tomar un multivitamínico diario tras consultar a su médico.  SOLICITE ATENCIÓN MÉDICA SI:  · Sufre un cansancio persistente, que no puede describir.  · Tiene fiebre.  · Pierde peso de manera involuntaria.  · Sufre dolores de cabeza.  ·   Sufre trastornos de sueño durante la noche.  · Se siente triste.  · Sufre constipación.  · Tiene la piel seca.  · Ha ganado peso.  · Toma algún medicamento nuevo o diferente y sospecha que le causa fatiga.  · No puede dormir por la noche.  · Observa hinchazón inusual en sus piernas u otras partes del cuerpo.  SOLICITE ATENCIÓN MÉDICA SI:  · Se siente confundido.  · Su visión es borrosa.  · Sufre mareos o se desmaya.  · Sufre un dolor de cabeza intenso.  · Sufre  un dolor abdominal, pélvico o de espalda intensos.  · Siente dolor en el pecho, le falta el aire o tiene un ritmo cardíaco irregular o rápido.  · No puede orinar normalmente.  · Tiene una hemorragia anormal, como sangrado del recto o vomita sangre.  · Tiene ideas de suicidio o de hacerse daño.  · Está preocupado porque podría perjudicar a alguien más.  ASEGÚRESE QUE:   · Comprende estas instrucciones.  · Controlará su enfermedad.  · Solicitará ayuda de inmediato si no mejora o si empeora.  Document Released: 07/27/2008 Document Revised: 07/03/2011  [employer]® Patient Information ©2014 [employer].

## 2013-08-20 NOTE — ED Provider Notes (Signed)
Medical screening examination/treatment/procedure(s) were performed by non-physician practitioner and as supervising physician I was immediately available for consultation/collaboration.   EKG Interpretation   Date/Time:  Sunday August 17 2013 22:41:24 EDT Ventricular Rate:  61 PR Interval:  176 QRS Duration: 88 QT Interval:  400 QTC Calculation: 402 R Axis:   17 Text Interpretation:  Normal sinus rhythm Normal ECG since last tracing no  significant change Confirmed by Letroy Vazguez  MD, Auriel Kist ([id number]) on 08/17/2013  10:45:43 PM        Malvin Johns, MD 08/20/13 [phone]

## 2014-03-07 ENCOUNTER — Emergency Department (HOSPITAL_COMMUNITY): Payer: Medicare Other

## 2014-03-07 ENCOUNTER — Encounter (HOSPITAL_COMMUNITY): Payer: Self-pay | Admitting: Emergency Medicine

## 2014-03-07 ENCOUNTER — Emergency Department (HOSPITAL_COMMUNITY)
Admission: EM | Admit: 2014-03-07 | Discharge: 2014-03-08 | Disposition: A | Discharge status: Home / Self Care | Payer: Medicare Other | Attending: Emergency Medicine | Admitting: Emergency Medicine

## 2014-03-07 DIAGNOSIS — Z79899 Other long term (current) drug therapy: Secondary | ICD-10-CM | POA: Insufficient documentation

## 2014-03-07 DIAGNOSIS — E119 Type 2 diabetes mellitus without complications: Secondary | ICD-10-CM | POA: Insufficient documentation

## 2014-03-07 DIAGNOSIS — Z8739 Personal history of other diseases of the musculoskeletal system and connective tissue: Secondary | ICD-10-CM | POA: Insufficient documentation

## 2014-03-07 DIAGNOSIS — R609 Edema, unspecified: Secondary | ICD-10-CM

## 2014-03-07 DIAGNOSIS — M898X8 Other specified disorders of bone, other site: Secondary | ICD-10-CM

## 2014-03-07 DIAGNOSIS — E039 Hypothyroidism, unspecified: Secondary | ICD-10-CM | POA: Insufficient documentation

## 2014-03-07 DIAGNOSIS — Z792 Long term (current) use of antibiotics: Secondary | ICD-10-CM | POA: Insufficient documentation

## 2014-03-07 DIAGNOSIS — R269 Unspecified abnormalities of gait and mobility: Secondary | ICD-10-CM | POA: Insufficient documentation

## 2014-03-07 DIAGNOSIS — R220 Localized swelling, mass and lump, head: Secondary | ICD-10-CM | POA: Diagnosis not present

## 2014-03-07 DIAGNOSIS — R51 Headache: Secondary | ICD-10-CM | POA: Diagnosis present

## 2014-03-07 DIAGNOSIS — Z8546 Personal history of malignant neoplasm of prostate: Secondary | ICD-10-CM | POA: Insufficient documentation

## 2014-03-07 HISTORY — DX: Type 2 diabetes mellitus without complications: E11.9

## 2014-03-07 NOTE — ED Notes (Signed)
Per pt and family - pt has been experiencing intermittent headaches and scalp swelling that began yesterday, pt admits to recent pruritis to scalp and has been washing more frequently w/ shampoo and pt also dyed his hair this a.m. Pt denies any n/v, fever, lightheadedness or dizziness - pt concerned d/t starting fenofibrate on Monday and was concerned he may be having a reaction to that medication. Pt denies HA at present, no acute distress noted on exam - no gross neuro deficits noted.

## 2014-03-07 NOTE — ED Notes (Signed)
Pt c/o headache off and on x's 2 days.  Today top of head started to swell.  Pt also st's he has been forgetful for past 2 days.  Pt denies any injury

## 2014-03-07 NOTE — ED Provider Notes (Signed)
CSN: [id number]     Arrival date & time 03/07/14  1936 History   First MD Initiated Contact with Patient 03/07/14 2001     Chief Complaint  Patient presents with  . Headache   Patient is a 68 y.o. male presenting with general illness. The history is provided by the patient and a relative. The history is limited by a language barrier.  Illness Location:  Right Scalp Quality:  Pressure Severity:  Severe Onset quality:  Gradual Duration:  2 days Timing:  Constant Progression:  Worsening Chronicity:  New Context:  Intermittent headaches, intermittent feeling of drifting to the right when walking and driving Relieved by:  Nothing Worsened by:  Nothing Ineffective treatments:  Nothing Associated symptoms: headaches   Associated symptoms: no abdominal pain, no chest pain, no fever, no nausea, no rash and no vomiting     Past Medical History  Diagnosis Date  . Elevated PSA   . Frequency of urination   . Mixed hyperlipidemia     MILD PER PT  . Nocturia   . DJD (degenerative joint disease)   . Hypothyroidism    Past Surgical History  Procedure Laterality Date  . Prostate biopsy N/A 07/19/2012    Procedure: EUA, PROSTATE BIOPSY TRANSRECTAL ULTRASONIC PROSTATE (TUBP);  Surgeon: Fredricka Bonine, MD;  Location: [facility];  Service: Urology;  Laterality: N/A;   History reviewed. No pertinent family history. History  Substance Use Topics  . Smoking status: Former Smoker -- 15 years    Types: Cigarettes  . Smokeless tobacco: Never Used     Comment: QUIT SMOKING 32 YRS AGO--  APPROX. 1980'S  . Alcohol Use: No    Review of Systems  Constitutional: Negative for fever.  Cardiovascular: Negative for chest pain.  Gastrointestinal: Negative for nausea, vomiting and abdominal pain.  Musculoskeletal: Positive for gait problem.  Skin: Negative for rash.  Neurological: Positive for headaches. Negative for facial asymmetry, speech difficulty, weakness and numbness.    All other systems reviewed and are negative.   Allergies  Review of patient's allergies indicates no known allergies.  Home Medications   Prior to Admission medications   Medication Sig Start Date End Date Taking? Authorizing Provider  levofloxacin (LEVAQUIN) 500 MG tablet Take 500 mg by mouth daily.    Historical Provider, MD  levothyroxine (SYNTHROID, LEVOTHROID) 175 MCG tablet Take 175 mcg by mouth every morning.    Historical Provider, MD  Misc Natural Products (OSTEO BI-FLEX TRIPLE STRENGTH PO) Take by mouth daily.    Historical Provider, MD  Omega-3 Fatty Acids (FISH OIL PO) Take by mouth daily.    Historical Provider, MD  Specialty Vitamins Products (MAGNESIUM, AMINO ACID CHELATE,) 133 MG tablet Take 1 tablet by mouth 2 (two) times daily.    Historical Provider, MD  tamsulosin (FLOMAX) 0.4 MG CAPS Take 0.4 mg by mouth daily.    Historical Provider, MD   BP 144/89 mmHg  Pulse 74  Temp(Src) 98.5 F (36.9 C) (Oral)  Resp 18  SpO2 94%   Physical Exam  Constitutional: He is oriented to person, place, and time. He appears well-developed and well-nourished. No distress.  HENT:  Head: Atraumatic.    Right Ear: External ear normal.  Left Ear: External ear normal.  Mouth/Throat: Oropharynx is clear and moist.  Eyes: EOM are normal. Pupils are equal, round, and reactive to light.  Neck: Normal range of motion.  Cardiovascular: Normal rate and regular rhythm.   Pulmonary/Chest: Effort normal and breath sounds  normal. No respiratory distress. He has no wheezes. He has no rales.  Abdominal: Soft. He exhibits no distension. There is no tenderness. There is no rebound and no guarding.  Neurological: He is alert and oriented to person, place, and time.  Face symmetric, sensation to light touch normal and equal bilaterally in V1-V3; normal strength with shoulder shrug; normal strength in all major muscle groups of upper and lower extremities; normal sensation to light touch in all  extremities; normal finger to nose, heel to shin; normal gait  Skin: Skin is warm and dry. No rash noted. He is not diaphoretic.  Psychiatric: He has a normal mood and affect.  Vitals reviewed.   ED Course  Procedures  Imaging Review Ct Head Wo Contrast  03/07/2014   CLINICAL DATA:  Headaches for 2 days, initial evaluation, sudden onset of swelling right frontoparietal area with no known injury  EXAM: CT HEAD WITHOUT CONTRAST  TECHNIQUE: Contiguous axial images were obtained from the base of the skull through the vertex without intravenous contrast.  COMPARISON:  12/12/2004  FINDINGS: Moderate diffuse atrophy. No hydrocephalus. No evidence of hemorrhage or extra-axial fluid.  There is abnormal appearance to the skull involving the right frontal bone and the parasagittal aspect of the left frontal bone, consisting of lytic ill-defined expansion of the bone. There is an associated overlying soft tissue subcutaneous mass near the vertex in the right frontal region measuring about 7 cm x 2 cm. There is an associated extra-axial mass arising off of the inner table of the skull anteriorly near the vertex primarily to the right of midline, measuring 5 x 3 cm.  IMPRESSION: There is an aggressive appearing mass involving primarily the right frontal bone, with extension into the subcutaneous soft tissues overlying jet as well as intracranial extension, forming an extra-axial mass. Differential diagnostic possibilities would include an aggressive variety of meningioma as well as primary or metastatic neoplastic bone lesion.   Electronically Signed   By: Skipper Cliche M.D.   On: 03/07/2014 22:41    MDM   Final diagnoses:  Swelling  Mass of skull    68 y.o. male with a past medical history of DM, prostate CA, hypothyroidism. Presents to the ED due to concern for a mass on his right frontal scalp. Associated with intermittent headaches over the last 2 days. Intermittently feels like he falls to the right  when walking and feels like he is going to the right when driving. At the time of my exam, he denies pain and difficulty walking. States he has "pressure" where the mass is.   Will obtain a CT head to further evaluate this mass.   CT head showed findings concerning for an aggressive appearing mass involving the right frontal bone with subcutaneous extension and intracranial extension.   On re-evaluation the patient ambulates with a steady gait. Continues to deny pain. 37, laughing.   Will touch base with neurosurgery to determine if this patient can follow up closely as an outpatient vs. needs admission.  Discussed his case with Dr. Christella Noa who stated that he would see the patient in the ED.   Updated the patient and the family about the pending neurosurgery consult and the findings of the mass on CT scan.   This case managed in conjunction with my attending, Dr. Maryan Rued.     Berenice Primas, MD 03/08/14 1062  Blanchie Dessert, MD 03/08/14 [id number]

## 2014-03-08 NOTE — ED Notes (Signed)
Pt ambulating independently w/ steady gait on d/c in no acute distress, A&Ox4.D/c instructions reviewed w/ pt and family - pt and family deny any further questions or concerns at present.  

## 2014-03-08 NOTE — Consult Note (Signed)
Reason for Consult:intra/extra cranial mass, lytic skull lesion Referring Physician: ed  Geoffrey Neal is an 68 y.o. male.  HPI: whom just noticed a large firm mass in the right frontal region. The mass is not tender,is firm, and was not noticed until today. No head trauma. No fevers, non smoker. He simply has been at his baseline. Head CT revealed lytic skull lesion, hyperostosis, intra and extracranial components.  Past Medical History  Diagnosis Date  . Elevated PSA   . Frequency of urination   . Mixed hyperlipidemia     MILD PER PT  . Nocturia   . DJD (degenerative joint disease)   . Hypothyroidism   . Diabetes mellitus without complication     Past Surgical History  Procedure Laterality Date  . Prostate biopsy N/A 07/19/2012    Procedure: EUA, PROSTATE BIOPSY TRANSRECTAL ULTRASONIC PROSTATE (TUBP);  Surgeon: Geoffrey Bonine, MD;  Location: [facility] ([place]  Service: Urology;  Laterality: N/A;    History reviewed. No pertinent family history.  Social History:  reports that he has quit smoking. His smoking use included Cigarettes. He smoked 0.00 packs per day for 15 years. He has never used smokeless tobacco. He reports that he does not drink alcohol or use illicit drugs.  Allergies: No Known Allergies  Medications: I have reviewed the patient's current medications.  No results found for this or any previous visit (from the past 48 hour(s)).  Ct Head Wo Contrast  03/07/2014   CLINICAL DATA:  Headaches for 2 days, initial evaluation, sudden onset of swelling right frontoparietal area with no known injury  EXAM: CT HEAD WITHOUT CONTRAST  TECHNIQUE: Contiguous axial images were obtained from the base of the skull through the vertex without intravenous contrast.  COMPARISON:  12/12/2004  FINDINGS: Moderate diffuse atrophy. No hydrocephalus. No evidence of hemorrhage or extra-axial fluid.  There is abnormal appearance to the skull involving the right frontal bone  and the parasagittal aspect of the left frontal bone, consisting of lytic ill-defined expansion of the bone. There is an associated overlying soft tissue subcutaneous mass near the vertex in the right frontal region measuring about 7 cm x 2 cm. There is an associated extra-axial mass arising off of the inner table of the skull anteriorly near the vertex primarily to the right of midline, measuring 5 x 3 cm.  IMPRESSION: There is an aggressive appearing mass involving primarily the right frontal bone, with extension into the subcutaneous soft tissues overlying jet as well as intracranial extension, forming an extra-axial mass. Differential diagnostic possibilities would include an aggressive variety of meningioma as well as primary or metastatic neoplastic bone lesion.   Electronically Signed   By: Geoffrey Neal M.D.   On: 03/07/2014 22:41    Review of Systems  Constitutional: Negative.   HENT:       Headache only for ~two days, currently he does not have a headache  Eyes: Negative.   Respiratory: Negative.   Cardiovascular: Negative.   Gastrointestinal: Negative.   Genitourinary: Negative.   Musculoskeletal: Negative.   Skin: Negative.   Neurological: Positive for headaches.  Endo/Heme/Allergies: Negative.   Psychiatric/Behavioral: Negative.    Blood pressure 148/93, pulse 68, temperature 98.6 F (37 C), temperature source Oral, resp. rate 16, SpO2 93 %. Physical Exam  Constitutional: He is oriented to person, place, and time. He appears well-developed and well-nourished.  HENT:  Head: Atraumatic.  Right Ear: External ear normal.  Left Ear: External ear normal.  Large right  frontal mass, firm, not tender, underneath the scalp. No discoloration of the scalp  Eyes: Conjunctivae and EOM are normal. Pupils are equal, round, and reactive to light. Right eye exhibits no discharge. Left eye exhibits no discharge.  Neck: Normal range of motion. Neck supple.  Cardiovascular: Normal rate,  regular rhythm, normal heart sounds and intact distal pulses.   Respiratory: Effort normal and breath sounds normal.  GI: Soft.  Musculoskeletal: Normal range of motion.  Neurological: He is alert and oriented to person, place, and time. He has normal strength and normal reflexes. He is not disoriented. No cranial nerve deficit or sensory deficit. He displays a negative Romberg sign. GCS eye subscore is 4. GCS verbal subscore is 5. GCS motor subscore is 6. He displays no Babinski's sign on the right side. He displays no Babinski's sign on the left side.  Reflex Scores:      Tricep reflexes are 2+ on the right side and 2+ on the left side.      Bicep reflexes are 2+ on the right side and 2+ on the left side.      Brachioradialis reflexes are 2+ on the right side and 2+ on the left side.      Patellar reflexes are 2+ on the right side and 2+ on the left side.      Achilles reflexes are 2+ on the right side and 2+ on the left side. Normal sensory exam. Normal muscle tone and bulk  Skin: Skin is warm and dry.  Psychiatric: He has a normal mood and affect. His behavior is normal. Judgment and thought content normal.    Assessment/Plan: Will order MRI as outpatient. I favor meningioma, but it certainly could be a metastatic lesion. I have explained my rationale to the daughter and to Geoffrey Neal. I will see him in my office  Geoffrey Neal, 3:43 AM

## 2014-03-08 NOTE — Discharge Instructions (Signed)
Please follow-up with Dr. Cyndy Freeze.  Return to the emergency department for worsening condition or new concerning symptoms.

## 2014-03-08 NOTE — ED Notes (Signed)
Pt ambulated to restroom independently w/o difficulties - no complaints at present. Family at bedside.

## 2014-03-10 ENCOUNTER — Other Ambulatory Visit (HOSPITAL_COMMUNITY): Payer: Self-pay | Admitting: Neurosurgery

## 2014-03-10 DIAGNOSIS — D329 Benign neoplasm of meninges, unspecified: Secondary | ICD-10-CM

## 2014-03-16 ENCOUNTER — Encounter: Payer: Medicare Other | Admitting: Radiology

## 2014-03-16 ENCOUNTER — Encounter: Payer: Medicare Other | Admitting: Neurology

## 2014-03-24 ENCOUNTER — Other Ambulatory Visit (HOSPITAL_COMMUNITY): Payer: Self-pay | Admitting: Neurosurgery

## 2014-03-24 DIAGNOSIS — D329 Benign neoplasm of meninges, unspecified: Secondary | ICD-10-CM

## 2014-04-09 ENCOUNTER — Other Ambulatory Visit (HOSPITAL_COMMUNITY): Payer: Self-pay | Admitting: Neurosurgery

## 2014-04-09 DIAGNOSIS — D329 Benign neoplasm of meninges, unspecified: Secondary | ICD-10-CM

## 2014-04-10 ENCOUNTER — Ambulatory Visit (HOSPITAL_COMMUNITY)
Admission: RE | Admit: 2014-04-10 | Discharge: 2014-04-10 | Disposition: A | Discharge status: Home / Self Care | Payer: Medicare Other | Source: Ambulatory Visit | Attending: Neurosurgery | Admitting: Neurosurgery

## 2014-04-10 ENCOUNTER — Encounter (HOSPITAL_COMMUNITY): Payer: Self-pay

## 2014-04-10 DIAGNOSIS — D329 Benign neoplasm of meninges, unspecified: Secondary | ICD-10-CM | POA: Diagnosis not present

## 2014-04-10 LAB — BUN: BUN: 16 mg/dL (ref 6–23)

## 2014-04-10 LAB — CREATININE, SERUM
Creatinine, Ser: 0.89 mg/dL (ref 0.50–1.35)
GFR calc non Af Amer: 85 mL/min — ABNORMAL LOW (ref >90)

## 2014-04-10 MED ORDER — IOHEXOL 300 MG/ML  SOLN
75.0000 mL | Freq: Once | INTRAMUSCULAR | Status: AC | PRN
Start: 2014-04-10 — End: 2014-04-10
  Administered 2014-04-10: 75 mL via INTRAVENOUS

## 2014-04-29 ENCOUNTER — Other Ambulatory Visit: Payer: Self-pay | Admitting: Radiology

## 2014-05-05 ENCOUNTER — Encounter (HOSPITAL_COMMUNITY)
Admission: RE | Admit: 2014-05-05 | Discharge: 2014-05-05 | Disposition: A | Discharge status: Home / Self Care | Payer: Medicare Other | Source: Ambulatory Visit | Attending: Interventional Radiology | Admitting: Interventional Radiology

## 2014-05-05 ENCOUNTER — Encounter (HOSPITAL_COMMUNITY): Payer: Self-pay

## 2014-05-05 HISTORY — DX: Personal history of urinary calculi: Z87.442

## 2014-05-05 HISTORY — DX: Gastro-esophageal reflux disease without esophagitis: K21.9

## 2014-05-05 LAB — CBC WITH DIFFERENTIAL/PLATELET
BASOS PCT: 0 % (ref 0–1)
Basophils Absolute: 0 10*3/uL (ref 0.0–0.1)
EOS ABS: 0.1 10*3/uL (ref 0.0–0.7)
Eosinophils Relative: 1 % (ref 0–5)
HCT: 46.3 % (ref 39.0–52.0)
Hemoglobin: 16.3 g/dL (ref 13.0–17.0)
LYMPHS ABS: 2.4 10*3/uL (ref 0.7–4.0)
Lymphocytes Relative: 32 % (ref 12–46)
MCH: 31.7 pg (ref 26.0–34.0)
MCHC: 35.2 g/dL (ref 30.0–36.0)
MCV: 90.1 fL (ref 78.0–100.0)
MONO ABS: 0.5 10*3/uL (ref 0.1–1.0)
MONOS PCT: 7 % (ref 3–12)
NEUTROS PCT: 60 % (ref 43–77)
Neutro Abs: 4.6 10*3/uL (ref 1.7–7.7)
Platelets: 213 10*3/uL (ref 150–400)
RBC: 5.14 MIL/uL (ref 4.22–5.81)
RDW: 12.7 % (ref 11.5–15.5)
WBC: 7.6 10*3/uL (ref 4.0–10.5)

## 2014-05-05 LAB — PROTIME-INR
INR: 0.96 (ref 0.00–1.49)
PROTHROMBIN TIME: 12.9 s (ref 11.6–15.2)

## 2014-05-05 LAB — COMPREHENSIVE METABOLIC PANEL
ALT: 40 U/L (ref 0–53)
ANION GAP: 11 (ref 5–15)
AST: 32 U/L (ref 0–37)
Albumin: 4.1 g/dL (ref 3.5–5.2)
Alkaline Phosphatase: 99 U/L (ref 39–117)
BILIRUBIN TOTAL: 0.4 mg/dL (ref 0.3–1.2)
BUN: 13 mg/dL (ref 6–23)
CALCIUM: 9.4 mg/dL (ref 8.4–10.5)
CO2: 24 mmol/L (ref 19–32)
CREATININE: 0.97 mg/dL (ref 0.50–1.35)
Chloride: 106 mEq/L (ref 96–112)
GFR calc Af Amer: >90 mL/min (ref >90)
GFR calc non Af Amer: 82 mL/min — ABNORMAL LOW (ref >90)
Glucose, Bld: 129 mg/dL — ABNORMAL HIGH (ref 70–99)
Potassium: 4.1 mmol/L (ref 3.5–5.1)
Sodium: 141 mmol/L (ref 135–145)
TOTAL PROTEIN: 7.3 g/dL (ref 6.0–8.3)

## 2014-05-05 LAB — APTT: aPTT: 33 seconds (ref 24–37)

## 2014-05-05 NOTE — Pre-Procedure Instructions (Addendum)
Geoffrey Neal  05/05/2014   Your procedure is scheduled on:  05/07/14  Report to [facility] cone short stay admitting at 800 AM.  Call this number if you have problems the morning of surgery: [phone]   Remember:   Do not eat food or drink liquids after midnight.   Take these medicines the morning of surgery with A SIP OF WATER:levothyroxine    STOP all herbel meds, nsaids (aleve,naproxen,advil,ibuprofen) now including vitamins, aspirin,otc supplements   Do not wear jewelry, make-up or nail polish.  Do not wear lotions, powders, or perfumes. You may wear deodorant.  Do not shave 48 hours prior to surgery. Men may shave face and neck.  Do not bring valuables to the hospital.  [facility] is not responsible                  for any belongings or valuables.               Contacts, dentures or bridgework may not be worn into surgery.  Leave suitcase in the car. After surgery it may be brought to your room.  For patients admitted to the hospital, discharge time is determined by your                treatment team.               Patients discharged the day of surgery will not be allowed to drive  home.  Name and phone number of your driver:   Special Instructions:  Special Instructions: [place] - Preparing for Surgery  Before surgery, you can play an important role.  Because skin is not sterile, your skin needs to be as free of germs as possible.  You can reduce the number of germs on you skin by washing with CHG (chlorahexidine gluconate) soap before surgery.  CHG is an antiseptic cleaner which kills germs and bonds with the skin to continue killing germs even after washing.  Please DO NOT use if you have an allergy to CHG or antibacterial soaps.  If your skin becomes reddened/irritated stop using the CHG and inform your nurse when you arrive at Short Stay.  Do not shave (including legs and underarms) for at least 48 hours prior to the first CHG shower.  You may shave your face.  Please  follow these instructions carefully:   1.  Shower with CHG Soap the night before surgery and the morning of Surgery.  2.  If you choose to wash your hair, wash your hair first as usual with your normal shampoo.  3.  After you shampoo, rinse your hair and body thoroughly to remove the Shampoo.  4.  Use CHG as you would any other liquid soap.  You can apply chg directly  to the skin and wash gently with scrungie or a clean washcloth.  5.  Apply the CHG Soap to your body ONLY FROM THE NECK DOWN.  Do not use on open wounds or open sores.  Avoid contact with your eyes ears, mouth and genitals (private parts).  Wash genitals (private parts)       with your normal soap.  6.  Wash thoroughly, paying special attention to the area where your surgery will be performed.  7.  Thoroughly rinse your body with warm water from the neck down.  8.  DO NOT shower/wash with your normal soap after using and rinsing off the CHG Soap.  9.  Pat yourself dry with a clean towel.  10.  Wear clean pajamas.            11.  Place clean sheets on your bed the night of your first shower and do not sleep with pets.  Day of Surgery  Do not apply any lotions/deodorants the morning of surgery.  Please wear clean clothes to the hospital/surgery center.   Please read over the following fact sheets that you were given: Pain Booklet, Coughing and Deep Breathing and Surgical Site Infection Prevention

## 2014-05-05 NOTE — Progress Notes (Signed)
Anesthesia Chart Review:  Patient is a 69 year old male scheduled for cerebral arteriogram with possible embolization of menigiomia by Dr. Estanislado Pandy on 05/07/14 followed by craniotomy for tumor excision on 05/08/14 by Dr. Christella Noa.  Patient was recently seen in the ED for headaches with right frontal scalp mass. CT showed an aggressive mass involving primarily the right frontal bone with extension in the subcutaneous tissues with differential including meningioma or metastatic neoplastic bone lesion. MRI findings were most consistent with an aggressive meningioma (see below).  History includes HLD, hypothyroidism, DM2, former smoker, GERD, elevated PSA, nocturia, nephrolithiasis. BMI is consistent with mild obesity. No PCP is listed.  MRI of the brain on 03/12/14: IMPRESSION: Large right frontal mass. There is a large extra-axial intracranial mass consistent with meningioma with mass effect on the brain but no significant edema in the brain. The mass extends through the right frontal bone into the subcutaneous tissues were there is a large enhancing mass lesion. 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 Hemangiopericytoma is a possibility. Lymphoma and metastatic disease considered less likely.  CT of the head 04/10/14: IMPRESSION: 1. Study for preoperative planning demonstrating a stable large extra-axial, intra- and extracranial soft tissue mass with infiltration of the calvarium. 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 Stable right hemisphere mass effect with no cerebral edema. 2. No new intracranial abnormality.  EKG 08/17/13: NSR.  08/17/13 CXR: There is no evidence of pneumonia nor CHF. Mild prominence of the pulmonary interstitial markings is nonspecific but may reflect the patient's smoking history.  Preoperative labs noted. Per anesthesia protocol, he will require a T&S prior to his craniotomy.   George [facility] Short Stay  Center/Anesthesiology Phone [phone] 05/05/2014 6:25 PM

## 2014-05-06 ENCOUNTER — Other Ambulatory Visit: Payer: Self-pay | Admitting: Radiology

## 2014-05-06 MED ORDER — SODIUM CHLORIDE 0.9 % IV SOLN: Freq: Once | INTRAVENOUS | Status: DC

## 2014-05-06 MED ORDER — CEFAZOLIN SODIUM-DEXTROSE 2-3 GM-% IV SOLR
2.0000 g | INTRAVENOUS | Status: AC
  Administered 2014-05-07 (×2): 2 g via INTRAVENOUS

## 2014-05-07 ENCOUNTER — Encounter (HOSPITAL_COMMUNITY): Payer: Self-pay | Admitting: Certified Registered Nurse Anesthetist

## 2014-05-07 ENCOUNTER — Encounter (HOSPITAL_COMMUNITY)
Admission: RE | Disposition: A | Discharge status: Rehabilitation Facility | Payer: Self-pay | Source: Ambulatory Visit | Attending: Neurosurgery

## 2014-05-07 ENCOUNTER — Inpatient Hospital Stay (HOSPITAL_COMMUNITY)
Admission: RE | Admit: 2014-05-07 | Discharge: 2014-05-25 | DRG: 027 | Disposition: A | Discharge status: Rehabilitation Facility | Payer: Medicare Other | Source: Ambulatory Visit | Attending: Neurosurgery | Admitting: Neurosurgery

## 2014-05-07 ENCOUNTER — Ambulatory Visit (HOSPITAL_COMMUNITY): Payer: Medicare Other | Admitting: Certified Registered Nurse Anesthetist

## 2014-05-07 ENCOUNTER — Ambulatory Visit (HOSPITAL_COMMUNITY): Payer: Medicare Other | Admitting: Vascular Surgery

## 2014-05-07 ENCOUNTER — Ambulatory Visit (HOSPITAL_COMMUNITY)
Admission: RE | Admit: 2014-05-07 | Discharge: 2014-05-07 | Disposition: A | Discharge status: Home / Self Care | Payer: Medicare Other | Source: Ambulatory Visit | Attending: Neurosurgery | Admitting: Neurosurgery

## 2014-05-07 DIAGNOSIS — D320 Benign neoplasm of cerebral meninges: Principal | ICD-10-CM | POA: Diagnosis present

## 2014-05-07 DIAGNOSIS — M1990 Unspecified osteoarthritis, unspecified site: Secondary | ICD-10-CM | POA: Diagnosis present

## 2014-05-07 DIAGNOSIS — D329 Benign neoplasm of meninges, unspecified: Secondary | ICD-10-CM

## 2014-05-07 DIAGNOSIS — Z87891 Personal history of nicotine dependence: Secondary | ICD-10-CM | POA: Insufficient documentation

## 2014-05-07 DIAGNOSIS — R269 Unspecified abnormalities of gait and mobility: Secondary | ICD-10-CM | POA: Diagnosis not present

## 2014-05-07 DIAGNOSIS — Z79899 Other long term (current) drug therapy: Secondary | ICD-10-CM

## 2014-05-07 DIAGNOSIS — K219 Gastro-esophageal reflux disease without esophagitis: Secondary | ICD-10-CM | POA: Diagnosis present

## 2014-05-07 DIAGNOSIS — R51 Headache: Secondary | ICD-10-CM | POA: Diagnosis present

## 2014-05-07 DIAGNOSIS — E119 Type 2 diabetes mellitus without complications: Secondary | ICD-10-CM | POA: Diagnosis present

## 2014-05-07 DIAGNOSIS — R531 Weakness: Secondary | ICD-10-CM | POA: Diagnosis not present

## 2014-05-07 DIAGNOSIS — D496 Neoplasm of unspecified behavior of brain: Secondary | ICD-10-CM | POA: Diagnosis present

## 2014-05-07 DIAGNOSIS — E782 Mixed hyperlipidemia: Secondary | ICD-10-CM | POA: Diagnosis present

## 2014-05-07 DIAGNOSIS — E785 Hyperlipidemia, unspecified: Secondary | ICD-10-CM

## 2014-05-07 DIAGNOSIS — Z5189 Encounter for other specified aftercare: Secondary | ICD-10-CM | POA: Diagnosis not present

## 2014-05-07 DIAGNOSIS — E039 Hypothyroidism, unspecified: Secondary | ICD-10-CM | POA: Diagnosis present

## 2014-05-07 HISTORY — PX: RADIOLOGY WITH ANESTHESIA: SHX6223

## 2014-05-07 LAB — POCT ACTIVATED CLOTTING TIME
ACTIVATED CLOTTING TIME: 129 s
Activated Clotting Time: 112 seconds
Activated Clotting Time: 135 seconds

## 2014-05-07 LAB — GLUCOSE, CAPILLARY: GLUCOSE-CAPILLARY: 136 mg/dL — AB (ref 70–99)

## 2014-05-07 LAB — MRSA PCR SCREENING: MRSA BY PCR: NEGATIVE

## 2014-05-07 SURGERY — RADIOLOGY WITH ANESTHESIA
Anesthesia: Monitor Anesthesia Care

## 2014-05-07 MED ORDER — NEOSTIGMINE METHYLSULFATE 10 MG/10ML IV SOLN
INTRAVENOUS | Status: DC | PRN
  Administered 2014-05-07: 5 mg via INTRAVENOUS

## 2014-05-07 MED ORDER — EPHEDRINE SULFATE 50 MG/ML IJ SOLN
INTRAMUSCULAR | Status: DC | PRN
  Administered 2014-05-07 (×2): 5 mg via INTRAVENOUS

## 2014-05-07 MED ORDER — LIDOCAINE HCL (CARDIAC) 20 MG/ML IV SOLN
INTRAVENOUS | Status: DC | PRN
  Administered 2014-05-07: 60 mg via INTRAVENOUS

## 2014-05-07 MED ORDER — PANTOPRAZOLE SODIUM 40 MG IV SOLR
40.0000 mg | Freq: Once | INTRAVENOUS | Status: AC
  Administered 2014-05-07: 40 mg via INTRAVENOUS
  Filled 2014-05-07: qty 40

## 2014-05-07 MED ORDER — GLYCOPYRROLATE 0.2 MG/ML IJ SOLN
INTRAMUSCULAR | Status: DC | PRN
  Administered 2014-05-07 (×2): 0.2 mg via INTRAVENOUS
  Administered 2014-05-07: .8 mg via INTRAVENOUS

## 2014-05-07 MED ORDER — ROCURONIUM BROMIDE 100 MG/10ML IV SOLN
INTRAVENOUS | Status: DC | PRN
  Administered 2014-05-07: 10 mg via INTRAVENOUS
  Administered 2014-05-07: 50 mg via INTRAVENOUS
  Administered 2014-05-07: 20 mg via INTRAVENOUS

## 2014-05-07 MED ORDER — LACTATED RINGERS IV SOLN
INTRAVENOUS | Status: DC
  Administered 2014-05-07 (×3): via INTRAVENOUS

## 2014-05-07 MED ORDER — NITROGLYCERIN 1 MG/10 ML FOR IR/CATH LAB
INTRA_ARTERIAL | Status: AC
  Administered 2014-05-07: 25 ug via INTRA_ARTERIAL
  Filled 2014-05-07: qty 10

## 2014-05-07 MED ORDER — LEVETIRACETAM IN NACL 500 MG/100ML IV SOLN
500.0000 mg | Freq: Two times a day (BID) | INTRAVENOUS | Status: DC
  Administered 2014-05-07 – 2014-05-08 (×2): 500 mg via INTRAVENOUS
  Filled 2014-05-07 (×4): qty 100

## 2014-05-07 MED ORDER — SUCCINYLCHOLINE CHLORIDE 20 MG/ML IJ SOLN
INTRAMUSCULAR | Status: DC | PRN
  Administered 2014-05-07: 100 mg via INTRAVENOUS

## 2014-05-07 MED ORDER — LIDOCAINE HCL 1 % IJ SOLN
INTRAMUSCULAR | Status: AC
  Filled 2014-05-07: qty 20

## 2014-05-07 MED ORDER — FENTANYL CITRATE 0.05 MG/ML IJ SOLN
INTRAMUSCULAR | Status: AC
  Filled 2014-05-07: qty 2

## 2014-05-07 MED ORDER — KETOROLAC TROMETHAMINE 30 MG/ML IJ SOLN
INTRAMUSCULAR | Status: DC | PRN
  Administered 2014-05-07: 30 mg via INTRAVENOUS

## 2014-05-07 MED ORDER — ACETAMINOPHEN 500 MG PO TABS
1000.0000 mg | ORAL_TABLET | Freq: Four times a day (QID) | ORAL | Status: DC | PRN
Start: 2014-05-07 — End: 2014-05-25
  Filled 2014-05-07: qty 2

## 2014-05-07 MED ORDER — ONDANSETRON HCL 4 MG/2ML IJ SOLN
4.0000 mg | Freq: Four times a day (QID) | INTRAMUSCULAR | Status: DC | PRN
  Administered 2014-05-08 – 2014-05-09 (×2): 4 mg via INTRAVENOUS
  Filled 2014-05-07 (×3): qty 2

## 2014-05-07 MED ORDER — PHENYLEPHRINE HCL 10 MG/ML IJ SOLN
10.0000 mg | INTRAMUSCULAR | Status: DC | PRN
  Administered 2014-05-07: 15 ug/min via INTRAVENOUS

## 2014-05-07 MED ORDER — IOHEXOL 300 MG/ML  SOLN
150.0000 mL | Freq: Once | INTRAMUSCULAR | Status: AC | PRN
  Administered 2014-05-07: 250 mL via INTRAVENOUS

## 2014-05-07 MED ORDER — FENTANYL CITRATE 0.05 MG/ML IJ SOLN
25.0000 ug | INTRAMUSCULAR | Status: DC | PRN
  Administered 2014-05-08: 25 ug via INTRAVENOUS
  Filled 2014-05-07: qty 2

## 2014-05-07 MED ORDER — MIDAZOLAM HCL 5 MG/5ML IJ SOLN
INTRAMUSCULAR | Status: DC | PRN
  Administered 2014-05-07 (×2): 1 mg via INTRAVENOUS

## 2014-05-07 MED ORDER — KETOROLAC TROMETHAMINE 30 MG/ML IJ SOLN
30.0000 mg | Freq: Four times a day (QID) | INTRAMUSCULAR | Status: DC
  Filled 2014-05-07 (×3): qty 1

## 2014-05-07 MED ORDER — FENTANYL CITRATE 0.05 MG/ML IJ SOLN
25.0000 ug | Freq: Once | INTRAMUSCULAR | Status: AC
  Administered 2014-05-07: 25 ug via INTRAVENOUS

## 2014-05-07 MED ORDER — ACETAMINOPHEN 650 MG RE SUPP
650.0000 mg | Freq: Four times a day (QID) | RECTAL | Status: DC | PRN
  Filled 2014-05-07: qty 1

## 2014-05-07 MED ORDER — NITROGLYCERIN 1 MG/10 ML FOR IR/CATH LAB
100.0000 ug | INTRA_ARTERIAL | Status: AC
  Administered 2014-05-07 (×3): 25 ug via INTRA_ARTERIAL

## 2014-05-07 MED ORDER — PANTOPRAZOLE SODIUM 40 MG IV SOLR
40.0000 mg | Freq: Every day | INTRAVENOUS | Status: DC
  Filled 2014-05-07: qty 40

## 2014-05-07 MED ORDER — HEPARIN SODIUM (PORCINE) 1000 UNIT/ML IJ SOLN
INTRAMUSCULAR | Status: DC | PRN
  Administered 2014-05-07 (×4): 1 mL via INTRAVENOUS

## 2014-05-07 MED ORDER — DEXAMETHASONE SODIUM PHOSPHATE 10 MG/ML IJ SOLN
INTRAMUSCULAR | Status: DC | PRN
  Administered 2014-05-07: 10 mg via INTRAVENOUS

## 2014-05-07 MED ORDER — ONDANSETRON HCL 4 MG/2ML IJ SOLN
INTRAMUSCULAR | Status: DC | PRN
  Administered 2014-05-07: 4 mg via INTRAVENOUS

## 2014-05-07 MED ORDER — LEVOTHYROXINE SODIUM 175 MCG PO TABS
175.0000 ug | ORAL_TABLET | Freq: Every day | ORAL | Status: DC
  Filled 2014-05-07 (×3): qty 1

## 2014-05-07 MED ORDER — DEXAMETHASONE SODIUM PHOSPHATE 4 MG/ML IJ SOLN
2.0000 mg | INTRAMUSCULAR | Status: DC
  Filled 2014-05-07 (×5): qty 0.5

## 2014-05-07 MED ORDER — SODIUM CHLORIDE 0.9 % IV SOLN
INTRAVENOUS | Status: DC
  Administered 2014-05-07 – 2014-05-08 (×4): via INTRAVENOUS
  Administered 2014-05-09 – 2014-05-10 (×2): 1000 mL via INTRAVENOUS
  Administered 2014-05-12 – 2014-05-16 (×3): via INTRAVENOUS

## 2014-05-07 MED ORDER — FENTANYL CITRATE 0.05 MG/ML IJ SOLN
INTRAMUSCULAR | Status: DC | PRN
  Administered 2014-05-07: 25 ug via INTRAVENOUS
  Administered 2014-05-07: 75 ug via INTRAVENOUS
  Administered 2014-05-07: 100 ug via INTRAVENOUS

## 2014-05-07 MED ORDER — NICARDIPINE HCL IN NACL 20-0.86 MG/200ML-% IV SOLN
5.0000 mg/h | INTRAVENOUS | Status: DC
  Administered 2014-05-07: 5 mg/h via INTRAVENOUS
  Filled 2014-05-07: qty 200

## 2014-05-07 MED ORDER — CEFAZOLIN SODIUM-DEXTROSE 2-3 GM-% IV SOLR
INTRAVENOUS | Status: AC
  Filled 2014-05-07: qty 50

## 2014-05-07 NOTE — Procedures (Signed)
S/P  4 vessel cerebral arteriogram,floowed by embolization of large hypervascular bifrontal tumor using liquid onyx 18 into the  Left middle meninigeal branches the left superficial temporal branches with excellent penetration of the the peri capsular branches.,.

## 2014-05-07 NOTE — H&P (Signed)
Chief Complaint: Right frontal meningioma  Referring Physician(s): Dr. Christella Noa  History of Present Illness: Geoffrey Neal is a 69 y.o. male with approximately 2 month history of right frontal scalp mass with intermittent HA's/blurred vision. Subsequent imaging revealed an aggressive extra-axial intracranial mass c/w meningioma with infiltration of the calvarium as well as infiltration/occlusion of the superior sagittal sinus. He presents today following NS consultation for diagnostic cerebral angiogram with embolization of the mass prior to planned tumor excision on 05/08/14.   Past Medical History  Diagnosis Date  . Elevated PSA   . Frequency of urination   . Mixed hyperlipidemia     MILD PER PT  . Nocturia   . DJD (degenerative joint disease)   . Hypothyroidism   . Diabetes mellitus without complication     ?  Marland Kitchen History of kidney stones     yrs ago  . GERD (gastroesophageal reflux disease)     occ    Past Surgical History  Procedure Laterality Date  . Prostate biopsy N/A 07/19/2012    Procedure: EUA, PROSTATE BIOPSY TRANSRECTAL ULTRASONIC PROSTATE (TUBP);  Surgeon: Fredricka Bonine, MD;  Location: [facility];  Service: Urology;  Laterality: N/A;  . Tonsillectomy      Allergies: Review of patient's allergies indicates no known allergies.  Medications: Prior to Admission medications   Medication Sig Start Date End Date Taking? Authorizing Provider  levothyroxine (SYNTHROID, LEVOTHROID) 175 MCG tablet Take 175 mcg by mouth daily before breakfast.  02/19/14  Yes Historical Provider, MD    History reviewed. No pertinent family history.  History   Social History  . Marital Status: Married    Spouse Name: N/A    Number of Children: N/A  . Years of Education: N/A   Social History Main Topics  . Smoking status: Former Smoker -- 15 years    Types: Cigarettes    Quit date: 05/05/1981  . Smokeless tobacco: Never Used     Comment: QUIT SMOKING  34 YRS AGO--  APPROX. 1980'S  . Alcohol Use: No     Comment: quit 34 yrs ago  . Drug Use: No  . Sexual Activity: None   Other Topics Concern  . None   Social History Narrative      Review of Systems    Constitutional: Negative for fever and chills.  Eyes: Positive for visual disturbance.  Respiratory: Negative for cough and shortness of breath.   Cardiovascular: Negative for chest pain.  Gastrointestinal: Negative for nausea, vomiting, abdominal pain and blood in stool.  Genitourinary: Negative for hematuria.  Musculoskeletal: Positive for arthralgias. Negative for back pain.  Neurological: Positive for headaches. Negative for dizziness, seizures, syncope, facial asymmetry, speech difficulty, weakness and numbness.  Hematological: Bruises/bleeds easily.    Vital Signs: BP 167/98 mmHg  Pulse 68  Temp(Src) 98 F (36.7 C) (Oral)  Resp 18  Wt 211 lb (95.709 kg)  SpO2 98%  Physical Exam  Constitutional: He is oriented to person, place, and time. He appears well-developed and well-nourished.  HENT:  Large rt frontal scalp mass,NT  Cardiovascular: Normal rate.   occ ectopy noted  Pulmonary/Chest: Effort normal and breath sounds normal.  Abdominal: Soft. Bowel sounds are normal. There is no tenderness.  Musculoskeletal: Normal range of motion. He exhibits no edema.  Neurological: He is alert and oriented to person, place, and time.  Strength 5/5 all fours/sens fxn intact, no drift, face symm, tongue midline, speech nl, FMM nl    Imaging:  Ct Head W Wo Contrast  04/10/2014   CLINICAL DATA:  69 year old male with large extra-axial right frontal mass favored to be meningioma. Study for preoperative surgical planning. Subsequent encounter.  EXAM: CT HEAD WITHOUT AND WITH CONTRAST  TECHNIQUE: Contiguous axial images were obtained from the base of the skull through the vertex without and with intravenous contrast  CONTRAST:  72mL OMNIPAQUE IOHEXOL 300 MG/ML  SOLN  COMPARISON:   Brain MRI 03/12/2014, and earlier.  FINDINGS: Pre and post-contrast images of the brain including 3D surface shaded display images.  Permeative destruction of the calvarium at the anterior vertex and extending inferiorly into the right re- identified with associated broad-based extracranial and intracranial soft tissue mass. Size and configuration of the mass not significantly changed since 03/12/2014. Invasion and obliteration of the superior sagittal sinus at the vertex again noted. Stable mass effect on the right superior frontal gyrus, no associated cerebral edema identified.  No abnormal intra-axial enhancement. No ventriculomegaly. Basilar cisterns are stable and remain normal. Major vascular structures at the skullbase are enhancing. Calcified atherosclerosis at the skull base. No acute intracranial hemorrhage identified.  Mild paranasal sinus mucosal thickening not significantly changed. Visualized orbit soft tissues are within normal limits. Mastoids and tympanic cavities remain clear. No new osseous abnormality.  IMPRESSION: 1. Study for preoperative planning demonstrating a stable large extra-axial, intra- and extracranial soft tissue mass with infiltration of the calvarium. 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 Stable right hemisphere mass effect with no cerebral edema. 2. No new intracranial abnormality.   Electronically Signed   By: Lars Pinks M.D.   On: 04/10/2014 15:33   Ct 3d Recon At Scanner  04/10/2014   CLINICAL DATA:  69 year old male with large extra-axial right frontal mass favored to be meningioma. Study for preoperative surgical planning. Subsequent encounter.  EXAM: CT HEAD WITHOUT AND WITH CONTRAST  TECHNIQUE: Contiguous axial images were obtained from the base of the skull through the vertex without and with intravenous contrast  CONTRAST:  24mL OMNIPAQUE IOHEXOL 300 MG/ML  SOLN  COMPARISON:  Brain MRI 03/12/2014, and earlier.  FINDINGS: Pre and  post-contrast images of the brain including 3D surface shaded display images.  Permeative destruction of the calvarium at the anterior vertex and extending inferiorly into the right re- identified with associated broad-based extracranial and intracranial soft tissue mass. Size and configuration of the mass not significantly changed since 03/12/2014. Invasion and obliteration of the superior sagittal sinus at the vertex again noted. Stable mass effect on the right superior frontal gyrus, no associated cerebral edema identified.  No abnormal intra-axial enhancement. No ventriculomegaly. Basilar cisterns are stable and remain normal. Major vascular structures at the skullbase are enhancing. Calcified atherosclerosis at the skull base. No acute intracranial hemorrhage identified.  Mild paranasal sinus mucosal thickening not significantly changed. Visualized orbit soft tissues are within normal limits. Mastoids and tympanic cavities remain clear. No new osseous abnormality.  IMPRESSION: 1. Study for preoperative planning demonstrating a stable large extra-axial, intra- and extracranial soft tissue mass with infiltration of the calvarium. 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 Stable right hemisphere mass effect with no cerebral edema. 2. No new intracranial abnormality.   Electronically Signed   By: Lars Pinks M.D.   On: 04/10/2014 15:33    Labs:  CBC:  Recent Labs  08/17/13 2250 05/05/14 1203  WBC 6.2 7.6  HGB 15.3 16.3  HCT 44.5 46.3  PLT 180 213    COAGS:  Recent Labs  05/05/14 1203  INR 0.96  APTT 33    BMP:  Recent Labs  08/17/13 2250 04/10/14 1048 05/05/14 1203  NA 143  --  141  K 4.7  --  4.1  CL 103  --  106  CO2 28  --  24  GLUCOSE 138*  --  129*  BUN 21 16 13   CALCIUM 9.7  --  9.4  CREATININE 1.00 0.89 0.97  GFRNONAA 74* 85* 82*  GFRAA 86* >90 >90    LIVER FUNCTION TESTS:  Recent Labs  08/17/13 2250 05/05/14 1203  BILITOT 0.3  0.4  AST 34 32  ALT 54* 40  ALKPHOS 127* 99  PROT 7.2 7.3  ALBUMIN 4.1 4.1    TUMOR MARKERS: No results for input(s): AFPTM, CEA, CA199, CHROMGRNA in the last 8760 hours.  Assessment and Plan: Geoffrey Neal is a 69 y.o. male with approximately 2 month history of right frontal scalp mass with intermittent HA's/blurred vision. Subsequent imaging revealed an aggressive extra-axial intracranial mass c/w meningioma with infiltration of the calvarium as well as infiltration/occlusion of the superior sagittal sinus. He presents today following NS consultation for diagnostic cerebral angiogram with embolization of the mass prior to planned tumor excision on 05/08/14. Details/risks of procedure d/w pt/daughter via interpreter with their understanding and consent.      SignedAutumn Messing 05/07/2014, 8:42 AM

## 2014-05-07 NOTE — Anesthesia Procedure Notes (Signed)
Procedure Name: Intubation Date/Time: 05/07/2014 11:59 AM Performed by: Ollen Bowl Pre-anesthesia Checklist: Patient identified, Emergency Drugs available, Suction available, Patient being monitored and Timeout performed Patient Re-evaluated:Patient Re-evaluated prior to inductionOxygen Delivery Method: Circle system utilized and Simple face mask Preoxygenation: Pre-oxygenation with 100% oxygen Intubation Type: IV induction Ventilation: Mask ventilation without difficulty Laryngoscope Size: Mac and 4 Grade View: Grade I Tube type: Oral Tube size: 7.5 mm Number of attempts: 1 Airway Equipment and Method: Patient positioned with wedge pillow and Stylet Placement Confirmation: ETT inserted through vocal cords under direct vision,  positive ETCO2 and breath sounds checked- equal and bilateral Secured at: 23 cm Tube secured with: Tape Dental Injury: Teeth and Oropharynx as per pre-operative assessment

## 2014-05-07 NOTE — Transfer of Care (Signed)
Immediate Anesthesia Transfer of Care Note  Patient: Geoffrey Neal  Procedure(s) Performed: Procedure(s): RADIOLOGY WITH ANESTHESIA/EMBOLIZATION (N/A)  Patient Location: PACU  Anesthesia Type:General  Level of Consciousness: awake and alert   Airway & Oxygen Therapy: Patient Spontanous Breathing and Patient connected to nasal cannula oxygen  Post-op Assessment: Report given to PACU RN, Post -op Vital signs reviewed and stable and Patient moving all extremities X 4  Post vital signs: Reviewed and stable  Complications: No apparent anesthesia complications

## 2014-05-07 NOTE — Anesthesia Postprocedure Evaluation (Signed)
  Anesthesia Post-op Note  Patient: Geoffrey Neal  Procedure(s) Performed: Procedure(s): RADIOLOGY WITH ANESTHESIA/EMBOLIZATION (N/A)  Patient Location: PACU  Anesthesia Type:General  Level of Consciousness: awake and alert   Airway and Oxygen Therapy: Patient Spontanous Breathing  Post-op Pain: none  Post-op Assessment: Post-op Vital signs reviewed  Post-op Vital Signs: Reviewed  Last Vitals:  Filed Vitals:   05/07/14 1730  BP:   Pulse: 82  Temp:   Resp: 12    Complications: No apparent anesthesia complications

## 2014-05-07 NOTE — Anesthesia Preprocedure Evaluation (Addendum)
Anesthesia Evaluation  Patient identified by MRN, date of birth, ID band Patient awake    Reviewed: Allergy & Precautions, NPO status , Patient's Chart, lab work & pertinent test results  Airway Mallampati: II  TM Distance: >3 FB Neck ROM: Full    Dental  (+) Teeth Intact   Pulmonary former smoker,          Cardiovascular negative cardio ROS      Neuro/Psych Large right frontal meningioma with invasion into skull and subq. negative psych ROS   GI/Hepatic Neg liver ROS, GERD-  ,  Endo/Other  diabetesHypothyroidism Morbid obesity  Renal/GU negative Renal ROS     Musculoskeletal  (+) Arthritis -,   Abdominal   Peds  Hematology   Anesthesia Other Findings   Reproductive/Obstetrics                            Anesthesia Physical Anesthesia Plan  ASA: III  Anesthesia Plan: General and MAC   Post-op Pain Management:    Induction: Intravenous  Airway Management Planned: Oral ETT and Simple Face Mask  Additional Equipment: Arterial line  Intra-op Plan:   Post-operative Plan: Extubation in OR  Informed Consent: I have reviewed the patients History and Physical, chart, labs and discussed the procedure including the risks, benefits and alternatives for the proposed anesthesia with the patient or authorized representative who has indicated his/her understanding and acceptance.   Dental advisory given  Plan Discussed with: CRNA  Anesthesia Plan Comments: (Plan for MAC for diagnostic portion. If intervention indicated will convert to GETA.)       Anesthesia Quick Evaluation

## 2014-05-07 NOTE — Consult Note (Signed)
Reason for Consult:INtra extracranial tumor Referring Physician: deveshwar, Geoffrey Neal is an 69 y.o. male.  HPI: whom presented to the ED on 11/14 with a history of headache by 2 days and reported finding a mass on his head. Head CT showed an intra and extracranial mass which had grown through the skull. Mri confirmed the CT, this was an extrinsic mass causing only local mass effect, without shift. He had a normal exam at that time. Headache was resolved when I saw him in the ED.   Past Medical History  Diagnosis Date  . Elevated PSA   . Frequency of urination   . Mixed hyperlipidemia     MILD PER PT  . Nocturia   . DJD (degenerative joint disease)   . Hypothyroidism   . Diabetes mellitus without complication     ?  Marland Kitchen History of kidney stones     yrs ago  . GERD (gastroesophageal reflux disease)     occ    Past Surgical History  Procedure Laterality Date  . Prostate biopsy N/A 07/19/2012    Procedure: EUA, PROSTATE BIOPSY TRANSRECTAL ULTRASONIC PROSTATE (TUBP);  Surgeon: Fredricka Bonine, MD;  Location: [facility];  Service: Urology;  Laterality: N/A;  . Tonsillectomy      History reviewed. No pertinent family history.  Social History:  reports that he quit smoking about 33 years ago. His smoking use included Cigarettes. He quit after 15 years of use. He has never used smokeless tobacco. He reports that he does not drink alcohol or use illicit drugs.  Allergies: No Known Allergies  Medications: I have reviewed the patient's current medications.  Results for orders placed or performed during the hospital encounter of 05/07/14 (from the past 48 hour(s))  Glucose, capillary     Status: Abnormal   Collection Time: 05/07/14  4:21 PM  Result Value Ref Range   Glucose-Capillary 136 (H) 70 - 99 mg/dL    No results found.  Review of Systems  Constitutional: Negative.   HENT:       Mass on head  Eyes: Negative.   Respiratory: Negative.    Cardiovascular: Negative.   Genitourinary: Negative.   Musculoskeletal: Negative.   Skin: Negative.   Neurological: Negative.   Endo/Heme/Allergies: Negative.   Psychiatric/Behavioral: Negative.    Blood pressure 122/71, pulse 82, temperature 99.1 F (37.3 C), temperature source Oral, resp. rate 12, weight 95.709 kg (211 lb), SpO2 97 %. Physical Exam  Constitutional: He is oriented to person, place, and time. He appears well-developed and well-nourished. No distress.  HENT:  Head: Atraumatic.  Right Ear: External ear normal.  Left Ear: External ear normal.  Nose: Nose normal.  Mouth/Throat: Oropharynx is clear and moist.  Large mass underneath scalp right frontal region  Eyes: Conjunctivae and EOM are normal. Pupils are equal, round, and reactive to light.  Neck: Normal range of motion. Neck supple.  Cardiovascular: Normal rate, regular rhythm, normal heart sounds and intact distal pulses.   Respiratory: Effort normal and breath sounds normal.  GI: Soft. Bowel sounds are normal.  Neurological: He is alert and oriented to person, place, and time. He has normal strength and normal reflexes. He displays normal reflexes. No cranial nerve deficit or sensory deficit. He exhibits normal muscle tone. He displays a negative Romberg sign. Coordination and gait normal. He displays no Babinski's sign on the right side. He displays no Babinski's sign on the left side.  Skin: Skin is warm and dry.  Assessment/Plan: Admitted after IR embolization. Prep for OR as planned. Staged resection, will have custom implant created post op and plan on implanting during this hospitalization.  Risks and benefits including stroke, coma, death, bleeding, infection, inability to resect tumor, need  For further surgery brain damage, weakness, change in personality, and other risks were discussed he understands and wishes to proceed.  Loletta Harper L 05/07/2014, 7:40 PM

## 2014-05-07 NOTE — Progress Notes (Signed)
Patient ID: Geoffrey Neal, male   DOB: 1945/05/04, 69 y.o.   MRN: [id number] Patients GA reversed . Extubated without difficulty. However slow to wake up  With gradual clearing of sensorium.. Prior to transfer to neuro pacU, patient more appropriate and prompt. Moving all 4s equally. C/O a 5/10 frontal HA relieved with 30 mg of Toradol IV . VS stable  RT groin soft. D/W patient and family,with interpreters help.

## 2014-05-07 NOTE — OR Nursing (Signed)
Right groin level 0 throughout PACU recovery.

## 2014-05-08 ENCOUNTER — Encounter (HOSPITAL_COMMUNITY)
Admission: RE | Disposition: A | Discharge status: Rehabilitation Facility | Payer: Self-pay | Source: Ambulatory Visit | Attending: Neurosurgery

## 2014-05-08 ENCOUNTER — Inpatient Hospital Stay (HOSPITAL_COMMUNITY): Payer: Medicare Other

## 2014-05-08 ENCOUNTER — Inpatient Hospital Stay (HOSPITAL_COMMUNITY): Payer: Medicare Other | Admitting: Certified Registered Nurse Anesthetist

## 2014-05-08 ENCOUNTER — Encounter (HOSPITAL_COMMUNITY): Payer: Self-pay | Admitting: Anesthesiology

## 2014-05-08 ENCOUNTER — Inpatient Hospital Stay (HOSPITAL_COMMUNITY): Admission: RE | Admit: 2014-05-08 | Payer: Medicare Other | Source: Ambulatory Visit | Admitting: Neurosurgery

## 2014-05-08 HISTORY — PX: CRANIOTOMY: SHX93

## 2014-05-08 LAB — CBC WITH DIFFERENTIAL/PLATELET
BASOS ABS: 0 10*3/uL (ref 0.0–0.1)
BASOS PCT: 0 % (ref 0–1)
Eosinophils Absolute: 0 10*3/uL (ref 0.0–0.7)
Eosinophils Relative: 0 % (ref 0–5)
HCT: 40.5 % (ref 39.0–52.0)
HEMOGLOBIN: 13.9 g/dL (ref 13.0–17.0)
Lymphocytes Relative: 9 % — ABNORMAL LOW (ref 12–46)
Lymphs Abs: 1.2 10*3/uL (ref 0.7–4.0)
MCH: 30.5 pg (ref 26.0–34.0)
MCHC: 34.3 g/dL (ref 30.0–36.0)
MCV: 89 fL (ref 78.0–100.0)
MONO ABS: 0.8 10*3/uL (ref 0.1–1.0)
MONOS PCT: 6 % (ref 3–12)
NEUTROS ABS: 11.1 10*3/uL — AB (ref 1.7–7.7)
Neutrophils Relative %: 85 % — ABNORMAL HIGH (ref 43–77)
Platelets: 212 10*3/uL (ref 150–400)
RBC: 4.55 MIL/uL (ref 4.22–5.81)
RDW: 13.1 % (ref 11.5–15.5)
WBC: 13 10*3/uL — ABNORMAL HIGH (ref 4.0–10.5)

## 2014-05-08 LAB — BASIC METABOLIC PANEL
Anion gap: 8 (ref 5–15)
BUN: 11 mg/dL (ref 6–23)
CO2: 25 mmol/L (ref 19–32)
Calcium: 8.6 mg/dL (ref 8.4–10.5)
Chloride: 106 mEq/L (ref 96–112)
Creatinine, Ser: 0.96 mg/dL (ref 0.50–1.35)
GFR calc non Af Amer: 82 mL/min — ABNORMAL LOW (ref >90)
GLUCOSE: 121 mg/dL — AB (ref 70–99)
POTASSIUM: 3.8 mmol/L (ref 3.5–5.1)
SODIUM: 139 mmol/L (ref 135–145)

## 2014-05-08 LAB — ABO/RH: ABO/RH(D): B POS

## 2014-05-08 LAB — PREPARE RBC (CROSSMATCH)

## 2014-05-08 SURGERY — CRANIOTOMY TUMOR EXCISION
Anesthesia: General | Laterality: Bilateral

## 2014-05-08 MED ORDER — SODIUM CHLORIDE 0.9 % IJ SOLN
INTRAMUSCULAR | Status: AC
  Filled 2014-05-08: qty 10

## 2014-05-08 MED ORDER — ALBUMIN HUMAN 5 % IV SOLN
INTRAVENOUS | Status: DC | PRN
  Administered 2014-05-08 (×2): via INTRAVENOUS

## 2014-05-08 MED ORDER — LIDOCAINE HCL 4 % MT SOLN
OROMUCOSAL | Status: DC | PRN
  Administered 2014-05-08: 3 mL via TOPICAL

## 2014-05-08 MED ORDER — ROCURONIUM BROMIDE 50 MG/5ML IV SOLN
INTRAVENOUS | Status: AC
  Filled 2014-05-08: qty 1

## 2014-05-08 MED ORDER — FENTANYL CITRATE 0.05 MG/ML IJ SOLN
INTRAMUSCULAR | Status: DC | PRN
  Administered 2014-05-08: 100 ug via INTRAVENOUS
  Administered 2014-05-08 (×3): 50 ug via INTRAVENOUS
  Administered 2014-05-08: 100 ug via INTRAVENOUS
  Administered 2014-05-08 (×5): 50 ug via INTRAVENOUS

## 2014-05-08 MED ORDER — ROCURONIUM BROMIDE 100 MG/10ML IV SOLN
INTRAVENOUS | Status: DC | PRN
  Administered 2014-05-08: 20 mg via INTRAVENOUS
  Administered 2014-05-08: 30 mg via INTRAVENOUS
  Administered 2014-05-08: 50 mg via INTRAVENOUS

## 2014-05-08 MED ORDER — ONDANSETRON HCL 4 MG/2ML IJ SOLN: 4.0000 mg | Freq: Once | INTRAMUSCULAR | Status: DC | PRN

## 2014-05-08 MED ORDER — CEFAZOLIN SODIUM-DEXTROSE 2-3 GM-% IV SOLR
2.0000 g | INTRAVENOUS | Status: AC
  Administered 2014-05-08 (×2): 2 g via INTRAVENOUS

## 2014-05-08 MED ORDER — ONDANSETRON HCL 4 MG/2ML IJ SOLN
INTRAMUSCULAR | Status: AC
  Filled 2014-05-08: qty 2

## 2014-05-08 MED ORDER — ARTIFICIAL TEARS OP OINT
TOPICAL_OINTMENT | OPHTHALMIC | Status: AC
  Filled 2014-05-08: qty 3.5

## 2014-05-08 MED ORDER — SUCCINYLCHOLINE CHLORIDE 20 MG/ML IJ SOLN
INTRAMUSCULAR | Status: AC
  Filled 2014-05-08: qty 1

## 2014-05-08 MED ORDER — THROMBIN 20000 UNITS EX SOLR
CUTANEOUS | Status: DC | PRN
  Administered 2014-05-08 (×2): via TOPICAL

## 2014-05-08 MED ORDER — LEVETIRACETAM 500 MG PO TABS
500.0000 mg | ORAL_TABLET | Freq: Two times a day (BID) | ORAL | Status: DC
  Filled 2014-05-08: qty 1

## 2014-05-08 MED ORDER — GLYCOPYRROLATE 0.2 MG/ML IJ SOLN
INTRAMUSCULAR | Status: DC | PRN
  Administered 2014-05-08: .6 mg via INTRAVENOUS

## 2014-05-08 MED ORDER — LIDOCAINE HCL (CARDIAC) 20 MG/ML IV SOLN
INTRAVENOUS | Status: AC
  Filled 2014-05-08: qty 5

## 2014-05-08 MED ORDER — DEXAMETHASONE SODIUM PHOSPHATE 4 MG/ML IJ SOLN
4.0000 mg | Freq: Four times a day (QID) | INTRAMUSCULAR | Status: DC
  Administered 2014-05-08 – 2014-05-16 (×31): 4 mg via INTRAVENOUS
  Filled 2014-05-08 (×35): qty 1

## 2014-05-08 MED ORDER — VECURONIUM BROMIDE 10 MG IV SOLR
INTRAVENOUS | Status: DC | PRN
  Administered 2014-05-08: 3 mg via INTRAVENOUS
  Administered 2014-05-08 (×2): 2 mg via INTRAVENOUS
  Administered 2014-05-08: 3 mg via INTRAVENOUS

## 2014-05-08 MED ORDER — HYDROCODONE-ACETAMINOPHEN 5-325 MG PO TABS
1.0000 | ORAL_TABLET | ORAL | Status: DC | PRN
  Administered 2014-05-14: 2 via ORAL
  Filled 2014-05-08: qty 2

## 2014-05-08 MED ORDER — DEXAMETHASONE 4 MG PO TABS: 4.0000 mg | ORAL_TABLET | Freq: Four times a day (QID) | ORAL | Status: DC

## 2014-05-08 MED ORDER — 0.9 % SODIUM CHLORIDE (POUR BTL) OPTIME
TOPICAL | Status: DC | PRN
  Administered 2014-05-08 (×3): 1000 mL

## 2014-05-08 MED ORDER — BACITRACIN ZINC 500 UNIT/GM EX OINT
TOPICAL_OINTMENT | CUTANEOUS | Status: DC | PRN
  Administered 2014-05-08: 1 via TOPICAL

## 2014-05-08 MED ORDER — NEOSTIGMINE METHYLSULFATE 10 MG/10ML IV SOLN
INTRAVENOUS | Status: DC | PRN
  Administered 2014-05-08: 4 mg via INTRAVENOUS

## 2014-05-08 MED ORDER — LIDOCAINE HCL (CARDIAC) 20 MG/ML IV SOLN
INTRAVENOUS | Status: DC | PRN
  Administered 2014-05-08: 40 mg via INTRAVENOUS

## 2014-05-08 MED ORDER — HYDROMORPHONE HCL 1 MG/ML IJ SOLN: 0.2500 mg | INTRAMUSCULAR | Status: DC | PRN

## 2014-05-08 MED ORDER — LEVETIRACETAM IN NACL 500 MG/100ML IV SOLN
500.0000 mg | Freq: Two times a day (BID) | INTRAVENOUS | Status: DC
  Administered 2014-05-08 – 2014-05-13 (×10): 500 mg via INTRAVENOUS
  Filled 2014-05-08 (×11): qty 100

## 2014-05-08 MED ORDER — EPHEDRINE SULFATE 50 MG/ML IJ SOLN
INTRAMUSCULAR | Status: AC
  Filled 2014-05-08: qty 1

## 2014-05-08 MED ORDER — PROPOFOL 10 MG/ML IV BOLUS
INTRAVENOUS | Status: DC | PRN
  Administered 2014-05-08: 200 mg via INTRAVENOUS

## 2014-05-08 MED ORDER — PHENYLEPHRINE HCL 10 MG/ML IJ SOLN
10.0000 mg | INTRAMUSCULAR | Status: DC | PRN
  Administered 2014-05-08: 10 ug/min via INTRAVENOUS

## 2014-05-08 MED ORDER — DEXAMETHASONE SODIUM PHOSPHATE 10 MG/ML IJ SOLN
INTRAMUSCULAR | Status: DC | PRN
  Administered 2014-05-08: 10 mg via INTRAVENOUS

## 2014-05-08 MED ORDER — ONDANSETRON HCL 4 MG/2ML IJ SOLN
INTRAMUSCULAR | Status: DC | PRN
  Administered 2014-05-08: 4 mg via INTRAVENOUS

## 2014-05-08 SURGICAL SUPPLY — 135 items
APL SKNCLS STERI-STRIP NONHPOA (GAUZE/BANDAGES/DRESSINGS)
BANDAGE GAUZE 4  KLING STR (GAUZE/BANDAGES/DRESSINGS) ×3 IMPLANT
BENZOIN TINCTURE PRP APPL 2/3 (GAUZE/BANDAGES/DRESSINGS) IMPLANT
BLADE CLIPPER SURG (BLADE) ×4 IMPLANT
BLADE SAW GIGLI 16 STRL (MISCELLANEOUS) IMPLANT
BLADE SURG 15 STRL LF DISP TIS (BLADE) IMPLANT
BLADE SURG 15 STRL SS (BLADE)
BLADE ULTRA TIP 2M (BLADE) ×4 IMPLANT
BNDG GAUZE ELAST 4 BULKY (GAUZE/BANDAGES/DRESSINGS) ×3 IMPLANT
BOWL SPATULA (MISCELLANEOUS) ×1 IMPLANT
BRUSH SCRUB EZ 1% IODOPHOR (MISCELLANEOUS) ×1 IMPLANT
BUR ACORN 6.0 PRECISION (BURR) ×3 IMPLANT
BUR ACORN 6.0MM PRECISION (BURR) ×1
BUR ADDG 1.1 (BURR) IMPLANT
BUR ADDG 1.1MM (BURR)
BUR MATCHSTICK NEURO 3.0 LAGG (BURR) ×3 IMPLANT
BUR ROUTER D-58 CRANI (BURR) IMPLANT
BUR TAPERED ROUTER 2.3 (BUR) ×2 IMPLANT
BURR TAPERED ROUTER 2.3 (BUR) ×8
CANISTER SUCT 3000ML (MISCELLANEOUS) ×10 IMPLANT
CATH VENTRIC 35X38 W/TROCAR LG (CATHETERS) IMPLANT
CLIP RANEY DISP (INSTRUMENTS) ×6 IMPLANT
CLIP TI MEDIUM 6 (CLIP) ×3 IMPLANT
CONT SPEC 4OZ CLIKSEAL STRL BL (MISCELLANEOUS) ×14 IMPLANT
CORDS BIPOLAR (ELECTRODE) ×4 IMPLANT
COVER BACK TABLE 60X90IN (DRAPES) IMPLANT
COVER MAYO STAND STRL (DRAPES) IMPLANT
DECANTER SPIKE VIAL GLASS SM (MISCELLANEOUS) ×4 IMPLANT
DEPRESSOR TONGUE BLADE STERILE (MISCELLANEOUS) IMPLANT
DRAIN CHANNEL 10M FLAT 3/4 FLT (DRAIN) ×3 IMPLANT
DRAIN SNY WOU 7FLT (WOUND CARE) IMPLANT
DRAIN SUBARACHNOID (WOUND CARE) IMPLANT
DRAPE CAMERA VIDEO/LASER (DRAPES) IMPLANT
DRAPE LONG LASER MIC (DRAPES) IMPLANT
DRAPE MICROSCOPE LEICA (MISCELLANEOUS) ×3 IMPLANT
DRAPE NEUROLOGICAL W/INCISE (DRAPES) ×4 IMPLANT
DRAPE ORTHO SPLIT 77X108 STRL (DRAPES) ×8
DRAPE STERI IOBAN 125X83 (DRAPES) IMPLANT
DRAPE SURG 17X23 STRL (DRAPES) IMPLANT
DRAPE SURG IRRIG POUCH 19X23 (DRAPES) ×4 IMPLANT
DRAPE SURG ORHT 6 SPLT 77X108 (DRAPES) ×2 IMPLANT
DRAPE WARM FLUID 44X44 (DRAPE) ×4 IMPLANT
DRSG TELFA 3X8 NADH (GAUZE/BANDAGES/DRESSINGS) ×4 IMPLANT
DURAMATRIX ONLAY 2X2 (Neuro Prosthesis/Implant) ×3 IMPLANT
DURAMATRIX ONLAY 3X3 (Plate) ×3 IMPLANT
DURAPREP 6ML APPLICATOR 50/CS (WOUND CARE) ×7 IMPLANT
ELECT CAUTERY BLADE 6.4 (BLADE) ×4 IMPLANT
ELECT REM PT RETURN 9FT ADLT (ELECTROSURGICAL) ×4
ELECTRODE REM PT RTRN 9FT ADLT (ELECTROSURGICAL) ×2 IMPLANT
EVACUATOR 1/8 PVC DRAIN (DRAIN) IMPLANT
EVACUATOR SILICONE 100CC (DRAIN) ×3 IMPLANT
FORCEPS BIPOLAR SPETZLER 8 1.0 (NEUROSURGERY SUPPLIES) ×3 IMPLANT
GAUZE SPONGE 4X4 12PLY STRL (GAUZE/BANDAGES/DRESSINGS) ×7 IMPLANT
GAUZE SPONGE 4X4 16PLY XRAY LF (GAUZE/BANDAGES/DRESSINGS) ×6 IMPLANT
GLOVE BIO SURGEON STRL SZ 6.5 (GLOVE) IMPLANT
GLOVE BIO SURGEON STRL SZ7 (GLOVE) IMPLANT
GLOVE BIO SURGEON STRL SZ7.5 (GLOVE) IMPLANT
GLOVE BIO SURGEON STRL SZ8 (GLOVE) IMPLANT
GLOVE BIO SURGEON STRL SZ8.5 (GLOVE) IMPLANT
GLOVE BIO SURGEONS STRL SZ 6.5 (GLOVE)
GLOVE BIOGEL M 8.0 STRL (GLOVE) IMPLANT
GLOVE ECLIPSE 6.5 STRL STRAW (GLOVE) ×4 IMPLANT
GLOVE ECLIPSE 7.0 STRL STRAW (GLOVE) ×3 IMPLANT
GLOVE ECLIPSE 7.5 STRL STRAW (GLOVE) ×12 IMPLANT
GLOVE ECLIPSE 8.0 STRL XLNG CF (GLOVE) IMPLANT
GLOVE ECLIPSE 8.5 STRL (GLOVE) IMPLANT
GLOVE EXAM NITRILE LRG STRL (GLOVE) IMPLANT
GLOVE EXAM NITRILE MD LF STRL (GLOVE) IMPLANT
GLOVE EXAM NITRILE XL STR (GLOVE) IMPLANT
GLOVE EXAM NITRILE XS STR PU (GLOVE) IMPLANT
GLOVE INDICATOR 6.5 STRL GRN (GLOVE) IMPLANT
GLOVE INDICATOR 7.0 STRL GRN (GLOVE) IMPLANT
GLOVE INDICATOR 7.5 STRL GRN (GLOVE) ×3 IMPLANT
GLOVE INDICATOR 8.0 STRL GRN (GLOVE) IMPLANT
GLOVE INDICATOR 8.5 STRL (GLOVE) IMPLANT
GLOVE OPTIFIT SS 8.0 STRL (GLOVE) IMPLANT
GLOVE SURG SS PI 6.5 STRL IVOR (GLOVE) IMPLANT
GOWN STRL REUS W/ TWL LRG LVL3 (GOWN DISPOSABLE) ×5 IMPLANT
GOWN STRL REUS W/ TWL XL LVL3 (GOWN DISPOSABLE) ×2 IMPLANT
GOWN STRL REUS W/TWL 2XL LVL3 (GOWN DISPOSABLE) IMPLANT
GOWN STRL REUS W/TWL LRG LVL3 (GOWN DISPOSABLE) ×12
GOWN STRL REUS W/TWL XL LVL3 (GOWN DISPOSABLE) ×8
HEMOSTAT SURGICEL 2X14 (HEMOSTASIS) ×4 IMPLANT
KIT BASIN OR (CUSTOM PROCEDURE TRAY) ×4 IMPLANT
KIT DRAIN CSF ACCUDRAIN (MISCELLANEOUS) IMPLANT
KIT ROOM TURNOVER OR (KITS) ×4 IMPLANT
LIQUID BAND (GAUZE/BANDAGES/DRESSINGS) IMPLANT
NDL HYPO 25X1 1.5 SAFETY (NEEDLE) ×1 IMPLANT
NDL SPNL 18GX3.5 QUINCKE PK (NEEDLE) IMPLANT
NEEDLE HYPO 25X1 1.5 SAFETY (NEEDLE) ×4 IMPLANT
NEEDLE SPNL 18GX3.5 QUINCKE PK (NEEDLE) IMPLANT
NS IRRIG 1000ML POUR BTL (IV SOLUTION) ×10 IMPLANT
PACK CRANIOTOMY (CUSTOM PROCEDURE TRAY) ×4 IMPLANT
PAD ARMBOARD 7.5X6 YLW CONV (MISCELLANEOUS) ×12 IMPLANT
PAD DRESSING TELFA 3X8 NADH (GAUZE/BANDAGES/DRESSINGS) ×1 IMPLANT
PAD EYE OVAL STERILE LF (GAUZE/BANDAGES/DRESSINGS) IMPLANT
PATTIES SURGICAL .25X.25 (GAUZE/BANDAGES/DRESSINGS) IMPLANT
PATTIES SURGICAL .5 X.5 (GAUZE/BANDAGES/DRESSINGS) IMPLANT
PATTIES SURGICAL .5 X3 (DISPOSABLE) ×3 IMPLANT
PATTIES SURGICAL 1/4 X 3 (GAUZE/BANDAGES/DRESSINGS) IMPLANT
PATTIES SURGICAL 1X1 (DISPOSABLE) ×3 IMPLANT
PIN MAYFIELD SKULL DISP (PIN) IMPLANT
RUBBERBAND STERILE (MISCELLANEOUS) ×6 IMPLANT
SET CRAINOPLASTY (SET/KITS/TRAYS/PACK) ×1 IMPLANT
SPECIMEN JAR SMALL (MISCELLANEOUS) IMPLANT
SPONGE NEURO XRAY DETECT 1X3 (DISPOSABLE) ×6 IMPLANT
SPONGE SURGIFOAM ABS GEL 100 (HEMOSTASIS) ×10 IMPLANT
STAPLER SKIN PROX WIDE 3.9 (STAPLE) ×4 IMPLANT
STAPLER VISISTAT 35W (STAPLE) ×4 IMPLANT
SUT ETHILON 3 0 FSL (SUTURE) IMPLANT
SUT ETHILON 3 0 PS 1 (SUTURE) IMPLANT
SUT NURALON 4 0 TR CR/8 (SUTURE) ×18 IMPLANT
SUT PL GUT 3 0 FS 1 (SUTURE) IMPLANT
SUT SILK 0 (SUTURE) ×4
SUT SILK 0 MO-6 18XCR BRD 8 (SUTURE) ×1 IMPLANT
SUT SILK 0 TIES 10X30 (SUTURE) ×3 IMPLANT
SUT SILK 2 0 FS (SUTURE) ×3 IMPLANT
SUT STEEL 0 (SUTURE)
SUT STEEL 0 18XMFL TIE 17 (SUTURE) IMPLANT
SUT VIC AB 0 CT1 18XCR BRD8 (SUTURE) ×2 IMPLANT
SUT VIC AB 0 CT1 27 (SUTURE) ×4
SUT VIC AB 0 CT1 27XBRD ANTBC (SUTURE) ×1 IMPLANT
SUT VIC AB 0 CT1 8-18 (SUTURE) ×8
SUT VIC AB 2-0 CT2 18 VCP726D (SUTURE) ×14 IMPLANT
SYR 20ML ECCENTRIC (SYRINGE) ×4 IMPLANT
SYR BULB IRRIGATION 50ML (SYRINGE) ×3 IMPLANT
SYR CONTROL 10ML LL (SYRINGE) ×1 IMPLANT
TIP SONASTAR STD MISONIX 1.9 (TRAY / TRAY PROCEDURE) IMPLANT
TOWEL OR 17X24 6PK STRL BLUE (TOWEL DISPOSABLE) ×4 IMPLANT
TOWEL OR 17X26 10 PK STRL BLUE (TOWEL DISPOSABLE) ×4 IMPLANT
TRAY FOLEY CATH 14FRSI W/METER (CATHETERS) ×1 IMPLANT
TUBE CONNECTING 12'X1/4 (SUCTIONS) ×1
TUBE CONNECTING 12X1/4 (SUCTIONS) ×3 IMPLANT
UNDERPAD 30X30 INCONTINENT (UNDERPADS AND DIAPERS) ×4 IMPLANT
WATER STERILE IRR 1000ML POUR (IV SOLUTION) ×4 IMPLANT

## 2014-05-08 NOTE — Progress Notes (Signed)
Patient ID: [name], male   DOB: 1945/06/10, 69 y.o.   MRN: [id number] BP 128/75 mmHg  Pulse 86  Temp(Src) 98.1 F (36.7 C) (Oral)  Resp 22  Ht 5\' 7"  (1.702 m)  Wt 95.7 kg (210 lb 15.7 oz)  BMI 33.04 kg/m2  SpO2 96% Mr. joshua awoke moving his right upper extremity briskly, but with little movement in the left upper extremity and no movement in the lower extremities even to noxious stimuli. He was however easily arousable, and able to state his name. Perrl, full eom. Symmetric facies. Dressing is dry.  Head CT was essentially normal. There was no large clot subdurally, nor in the epidural space. I did not appreciate an infarct either.  I anticipate that he will improve, and given the location of the resection that a temporary plegia is not unexpected.

## 2014-05-08 NOTE — Progress Notes (Signed)
Patient speaks little english CRNA able to assist with pre-op assessment patient verbalize understanding that information is correct understands surgical procedure

## 2014-05-08 NOTE — Progress Notes (Signed)
Seen him 3 times tonite because he is not talking. 3 times he talks me back. Moving some the right leg

## 2014-05-08 NOTE — Progress Notes (Signed)
Pt wouldn't speak or follow commands at 1845. Dr. Joya Salm notified. Dr. Joya Salm to pt's room. Pt able to speak with Dr. Joya Salm at that time.

## 2014-05-08 NOTE — Transfer of Care (Signed)
Immediate Anesthesia Transfer of Care Note  Patient: Geoffrey Neal  Procedure(s) Performed: Procedure(s): BILATERAL FRONTAL PARIETAL CRANIOTOMY TUMOR EXCISION (Bilateral)  Patient Location: PACU  Anesthesia Type:General  Level of Consciousness: sedated  Airway & Oxygen Therapy: Patient Spontanous Breathing and Patient connected to face mask oxygen  Post-op Assessment: Report given to PACU RN and Post -op Vital signs reviewed and stable  Post vital signs: Reviewed and stable  Complications: No apparent anesthesia complications

## 2014-05-08 NOTE — Progress Notes (Signed)
Pt returned from CT<Dr Cabbell in CT to see results

## 2014-05-08 NOTE — Op Note (Signed)
05/07/2014 - 05/08/2014  4:06 PM  PATIENT:  Geoffrey Neal  69 y.o. male  PRE-OPERATIVE DIAGNOSIS:  Meningioma, intra and extracranial with skull involvement  POST-OPERATIVE DIAGNOSIS:  Meningioma, intra and extracranial with skull involvement  PROCEDURE:  Procedure(s): Stage I(Second planned stage is skull reconstruction) BILATERAL FRONTAL PARIETAL craniectomy for TUMOR EXCISION  SURGEON: Surgeon(s): Ashok Pall, MD Consuella Lose, MD Floyce Stakes, MD  ASSISTANTS:Botero, Germain Osgood, Nena Polio  ANESTHESIA:   general  EBL:  Total I/O In: 2600 [I.V.:2100; IV Piggyback:500] Out: 1860 [Urine:935; Drains:125; Blood:800]  BLOOD ADMINISTERED:none   COUNT:per nursing  DRAINS: (10) Jackson-Pratt drain(s) with closed bulb suction in the subgaleal space   SPECIMEN:  Source of Specimen:  subgaleal, skull, intradural  DICTATION: Geoffrey Neal was taken to the operating room, intubated, and placed under a general anesthetic  without difficulty. Once adequate anesthesia was obtained a three pin Mayfield head holder was placed. The head was shaved, prepped, and draped in a sterile manner. I opened the scalp starting on the left side just anterior to the ear, and worked in the supratemporal space to avoid the STA. I moved slowly since the scalp was hypervascularized, placing Raney clips as I opened. Going from left to right I continued to open the scalp with Dr. Cleotilde Neer assistance. We encountered the STA on the right side and suture ligated the vessel in order to decrease the blood supply to the tumor. Once the incision was complete, we then reflected the scalp rostrally over the tumor. At this time it was apparent that a part of the scalp was invaded by the tumor.  We removed the extracranial portion of the tumor using the cautery and the Leksell rongeur. We also used the periosteal elevators to essentially scrape tumor off the diseased skull. After removing the extracranial tumor  we were left looking at skull invaded by tumor and normal skull. Giving ourselves a margin around the diseased area we performed a generous craniectomy. The skull was elevated quite carefully leaving diseased dura intact. I sent the involved skull to pathology leaving an outer rim of normal bone. By this time a frozen specimen from the subgaleal tumor was diagnosed as meningioma. With this knowledge we now cut around the diseased dura  leaving what appeared to be normal dura around the edges of the craniectomy.  Dr. Joya Salm and I now worked to remove the intracranial tumor. We worked by using traction on the dura and tumor and counter traction on the brain. The tumor peeled away from the brain quite easily. Some parts of the mass were quite soft, some more firm. We divided the falx anteriorly using silk sutures to ligate the sinus, though all indications on the preop angiogram were that the anterior superior sagittal sinus were occluded. Once divided we retracted caudally on the tumor and dura and progressively detached the tumor from the brain. I used cautery to coagulate small vessels between the brain and tumor. We were able to dissect the tumor from the brain completely and divided the sinus caudal to the bulk of the tumor. However I know tumor was left in the dural margin as I was unwilling to possibly damage the sinus caudal to the tumor.  With the tumor removed we reconstructed the dura using a synthetic replacement. I did not try to reconstruct the skull because I am awaiting a custom implant for stage ll now that the craniectomy is complete.  We approximated the scalp with vicryl sutures in the galea, and  staples to approximate the scalp edges.I placed a subgaleal drain, then placed a sterile dressing. I removed the Mayfield head holder and he was subsequently extubated.  PLAN OF CARE: Admit to inpatient   PATIENT DISPOSITION:  PACU - hemodynamically stable.   Delay start of Pharmacological VTE  agent (>24hrs) due to surgical blood loss or risk of bleeding:  yes

## 2014-05-08 NOTE — Progress Notes (Signed)
Pt. To CT scan.

## 2014-05-08 NOTE — Anesthesia Procedure Notes (Signed)
Procedure Name: Intubation Date/Time: 05/08/2014 9:20 AM Performed by: Rush Farmer E Pre-anesthesia Checklist: Patient identified, Emergency Drugs available, Suction available, Patient being monitored and Timeout performed Patient Re-evaluated:Patient Re-evaluated prior to inductionOxygen Delivery Method: Circle system utilized Preoxygenation: Pre-oxygenation with 100% oxygen Intubation Type: IV induction Ventilation: Mask ventilation without difficulty and Oral airway inserted - appropriate to patient size Laryngoscope Size: Mac and 4 Grade View: Grade I Tube type: Oral Tube size: 7.5 mm Number of attempts: 1 Airway Equipment and Method: Stylet and LTA kit utilized Placement Confirmation: ETT inserted through vocal cords under direct vision,  positive ETCO2 and breath sounds checked- equal and bilateral Secured at: 22 cm Tube secured with: Tape Dental Injury: Teeth and Oropharynx as per pre-operative assessment  Comments: AOI with MAC 4 per Lavona Mound, SRNA with Dr. Tamala Julian supervising.

## 2014-05-08 NOTE — Progress Notes (Signed)
Pt only moving rt arm at this time, Dr, Christella Noa in and aware, ct ordered

## 2014-05-08 NOTE — Progress Notes (Signed)
Pt returned to room 3M07 from PACU unable to move left arm or either leg. He is only oriented to self. He does c/o pain. Pt family updated by Dr. Christella Noa.

## 2014-05-08 NOTE — Progress Notes (Signed)
Pt too drowsy to take POs safely. Dr. Joya Salm notified. Obtained order to change decadron and keppra to IV. Pt still follows commands with rt arm when stimulated vigorously, pupils equal and brisk.

## 2014-05-08 NOTE — Progress Notes (Addendum)
Pt taken to Neuro pre op. Bedside handoff to [facility]. Pt states in Spanish that his family knows surgery is this AM, and they were planning on arriving after surgery is finished.  Daughter arrived to unit, wishes to see Dad. He is still in Pre Op. She was taken to see him with permission from PACU staff.

## 2014-05-08 NOTE — Anesthesia Preprocedure Evaluation (Addendum)
Anesthesia Evaluation  Patient identified by MRN, date of birth, ID band Patient awake    Reviewed: Allergy & Precautions, NPO status , Patient's Chart, lab work & pertinent test results  Airway Mallampati: II  TM Distance: >3 FB Neck ROM: Full    Dental  (+) Teeth Intact   Pulmonary former smoker,          Cardiovascular     Neuro/Psych    GI/Hepatic GERD-  ,  Endo/Other  diabetes, Type 2Hypothyroidism   Renal/GU      Musculoskeletal  (+) Arthritis -,   Abdominal   Peds  Hematology   Anesthesia Other Findings   Reproductive/Obstetrics                            Anesthesia Physical Anesthesia Plan  ASA: II  Anesthesia Plan: General   Post-op Pain Management:    Induction: Intravenous  Airway Management Planned: Oral ETT  Additional Equipment:   Intra-op Plan:   Post-operative Plan: Extubation in OR  Informed Consent: I have reviewed the patients History and Physical, chart, labs and discussed the procedure including the risks, benefits and alternatives for the proposed anesthesia with the patient or authorized representative who has indicated his/her understanding and acceptance.     Plan Discussed with: CRNA, Anesthesiologist and Surgeon  Anesthesia Plan Comments:         Anesthesia Quick Evaluation

## 2014-05-08 NOTE — Anesthesia Postprocedure Evaluation (Signed)
  Anesthesia Post-op Note  Patient: Geoffrey Neal  Procedure(s) Performed: Procedure(s): BILATERAL FRONTAL PARIETAL CRANIOTOMY TUMOR EXCISION (Bilateral)  Patient Location: PACU  Anesthesia Type:General  Level of Consciousness: awake, oriented, sedated and patient cooperative  Airway and Oxygen Therapy: Patient Spontanous Breathing  Post-op Pain: mild  Post-op Assessment: Post-op Vital signs reviewed, Patient's Cardiovascular Status Stable, Respiratory Function Stable, Patent Airway, No signs of Nausea or vomiting and Pain level controlled  Post-op Vital Signs: stable  Last Vitals:  Filed Vitals:   05/08/14 1546  BP:   Pulse: 99  Temp: 36.7 C  Resp: 21    Complications: No apparent anesthesia complications

## 2014-05-09 ENCOUNTER — Inpatient Hospital Stay (HOSPITAL_COMMUNITY): Payer: Medicare Other

## 2014-05-09 ENCOUNTER — Encounter (HOSPITAL_COMMUNITY): Payer: Self-pay | Admitting: *Deleted

## 2014-05-09 MED ORDER — LEVOTHYROXINE SODIUM 100 MCG IV SOLR: 175.0000 ug | Freq: Every day | INTRAVENOUS | Status: DC

## 2014-05-09 MED ORDER — IOHEXOL 300 MG/ML  SOLN
75.0000 mL | Freq: Once | INTRAMUSCULAR | Status: AC | PRN
  Administered 2014-05-09: 75 mL via INTRAVENOUS

## 2014-05-09 MED ORDER — LEVOTHYROXINE SODIUM 100 MCG IV SOLR
87.5000 ug | Freq: Every day | INTRAVENOUS | Status: DC
  Administered 2014-05-09 – 2014-05-15 (×7): 87.5 ug via INTRAVENOUS
  Filled 2014-05-09 (×8): qty 5

## 2014-05-09 NOTE — Progress Notes (Signed)
Patient ID: Geoffrey Neal, male   DOB: [birth year], 69 y.o.   MRN: [id number] Stable. Moves both arms and the right leg. Some movement in left toes. Drain working well. Ct head pending

## 2014-05-10 DIAGNOSIS — D329 Benign neoplasm of meninges, unspecified: Secondary | ICD-10-CM | POA: Insufficient documentation

## 2014-05-10 LAB — POCT I-STAT 7, (LYTES, BLD GAS, ICA,H+H)
ACID-BASE DEFICIT: 1 mmol/L (ref 0.0–2.0)
ACID-BASE DEFICIT: 2 mmol/L (ref 0.0–2.0)
Bicarbonate: 25.2 mEq/L — ABNORMAL HIGH (ref 20.0–24.0)
Bicarbonate: 23.8 mEq/L (ref 20.0–24.0)
Calcium, Ion: 1.13 mmol/L (ref 1.13–1.30)
Calcium, Ion: 1.1 mmol/L — ABNORMAL LOW (ref 1.13–1.30)
HCT: 31 % — ABNORMAL LOW (ref 39.0–52.0)
HEMATOCRIT: 33 % — AB (ref 39.0–52.0)
Hemoglobin: 11.2 g/dL — ABNORMAL LOW (ref 13.0–17.0)
Hemoglobin: 10.5 g/dL — ABNORMAL LOW (ref 13.0–17.0)
O2 Saturation: 100 %
O2 Saturation: 100 %
PCO2 ART: 41.2 mmHg (ref 35.0–45.0)
PH ART: 7.369 (ref 7.350–7.450)
PO2 ART: 228 mmHg — AB (ref 80.0–100.0)
Patient temperature: 36.9
Potassium: 3.7 mmol/L (ref 3.5–5.1)
Potassium: 3.7 mmol/L (ref 3.5–5.1)
SODIUM: 141 mmol/L (ref 135–145)
Sodium: 142 mmol/L (ref 135–145)
TCO2: 27 mmol/L (ref 0–100)
TCO2: 25 mmol/L (ref 0–100)
pCO2 arterial: 43.8 mmHg (ref 35.0–45.0)
pH, Arterial: 7.365 (ref 7.350–7.450)
pO2, Arterial: 215 mmHg — ABNORMAL HIGH (ref 80.0–100.0)

## 2014-05-10 MED ORDER — HEPARIN SODIUM (PORCINE) 5000 UNIT/ML IJ SOLN
5000.0000 [IU] | Freq: Three times a day (TID) | INTRAMUSCULAR | Status: DC
  Administered 2014-05-10 – 2014-05-25 (×46): 5000 [IU] via SUBCUTANEOUS
  Filled 2014-05-10 (×47): qty 1

## 2014-05-10 NOTE — Progress Notes (Signed)
2 Days Post-Op  Subjective: Pt awake but not FC; has muttered a few words this am per nurse; moves rt side more than left  Objective: Vital signs in last 24 hours: Temp:  [98 F (36.7 C)-99.6 F (37.6 C)] 98.9 F (37.2 C) (01/17 0400) Pulse Rate:  [64-110] 75 (01/17 0900) Resp:  [14-20] 17 (01/17 0900) BP: (114-153)/(56-89) 121/68 mmHg (01/17 0900) SpO2:  [92 %-98 %] 93 % (01/17 0900) Arterial Line BP: (80)/(63) 80/63 mmHg (01/16 1000)    Intake/Output from previous day: 01/16 0701 - 01/17 0700 In: 2000 [I.V.:1800; IV Piggyback:200] Out: 7062 [Urine:3620; Drains:342] Intake/Output this shift: Total I/O In: 150 [I.V.:150] Out: -   Pt awake, tracks but does not FC, PERRL; muttering few words; movement R>L, ; rt CFA puncture site clean and dry, no hematoma; intact distal pulses  Lab Results:   Recent Labs  05/08/14 0630  WBC 13.0*  HGB 13.9  HCT 40.5  PLT 212   BMET  Recent Labs  05/08/14 0630  NA 139  K 3.8  CL 106  CO2 25  GLUCOSE 121*  BUN 11  CREATININE 0.96  CALCIUM 8.6   PT/INR No results for input(s): LABPROT, INR in the last 72 hours. ABG No results for input(s): PHART, HCO3 in the last 72 hours.  Invalid input(s): PCO2, PO2  Studies/Results: Ct Head Wo Contrast  05/08/2014   CLINICAL DATA:  Patient awoke from cranial surgery not moving lower extremities or LEFT upper extremity.  EXAM: CT HEAD WITHOUT CONTRAST  TECHNIQUE: Contiguous axial images were obtained from the base of the skull through the vertex without intravenous contrast.  COMPARISON:  Multiple prior preoperative studies.  FINDINGS: Status post bifrontal craniectomy for gross total meningioma removal following embolization. Surgical drain lies along the area of resection. Mild pneumocephalus.  No intracranial or intra-axial parenchymal hematoma. No areas of acute infarction are identified. No areas suspicious for venous thrombosis are observed. No midline shift. Mild proximal vascular  calcification without CT signs of proximal vascular thrombosis. Negative sinuses, mastoids, and orbits.  IMPRESSION: Given the extensive bifrontal craniectomy and tumor removal which has been performed, there are no worrisome postoperative features. See comments above.   Electronically Signed   By: Rolla Flatten M.D.   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 Postoperatively was not moving lower extremities or left upper extremity. Today moving both upper extremities an the right lower extremity, with some toe movement. Initial encounter.  EXAM: CT HEAD WITHOUT AND WITH CONTRAST  TECHNIQUE: Contiguous axial images were obtained from the base of the skull through the vertex without and with intravenous contrast  CONTRAST:  50mL OMNIPAQUE IOHEXOL 300 MG/ML  SOLN  COMPARISON:  Postoperative head CT 05/08/2014.  FINDINGS: Sequelae of vertex craniectomy and repair with percutaneous postoperative drain in place. Scalp hematoma and gas stable to mildly regressed. No new osseous abnormality. Visualized paranasal sinuses and mastoids are clear. Visualized orbit soft tissues are within normal limits.  No ventriculomegaly. Basilar cisterns are patent. There is some streak artifact at the vertex related to the mesh repair of the dura. Underlying resection changes along the surface of the brain. Small volume pneumocephalus and extra-axial fluid or blood. Mild underlying parenchymal hypodensity, favor postoperative in nature. Small volume anterior parafalcine subdural. No new intracranial mass effect. No new intracranial hemorrhage. No evidence of cortically based acute  infarction identified. Following contrast, no abnormal intracranial enhancement identified. Major intracranial vascular structures are enhancing.  IMPRESSION: 1. Stable/expected postoperative appearance of the brain status  post craniectomy and resection of large bifrontal tumor which had infiltrated the calvarium. 2. Following contrast no residual tumor identified. 3. No new intracranial abnormality identified.   Electronically Signed   By: Lars Pinks M.D.   On: 05/09/2014 12:01    Anti-infectives: Anti-infectives    Start     Dose/Rate Route Frequency Ordered Stop   05/08/14 0756  ceFAZolin (ANCEF) IVPB 2 g/50 mL premix     2 g100 mL/hr over 30 Minutes Intravenous 30 min pre-op 05/08/14 0756 05/08/14 1321   05/08/14 0600  ceFAZolin (ANCEF) IVPB 2 g/50 mL premix     2 g100 mL/hr over 30 Minutes Intravenous On call to O.R. 05/06/14 1348 05/08/14 0349      Assessment/Plan: S/P 4 vessel cerebral arteriogram,floowed by embolization of large hypervascular bifrontal tumor (c/w meningioma) using liquid onyx 18 into the left middle meninigeal branches, the left superficial temporal branches with excellent penetration of the the peri capsular branches 1/14; tumor excision 1/15; latest CT head stable; plans as per NS   LOS: 3 days    Randel Hargens,D [facility] 05/10/2014

## 2014-05-10 NOTE — Progress Notes (Signed)
Patient ID: Geoffrey Neal, male   DOB: 13-Jul-1945, 69 y.o.   MRN: [id number] More awake, started to move left toes with pain. To start heparin

## 2014-05-11 ENCOUNTER — Encounter (HOSPITAL_COMMUNITY): Payer: Self-pay | Admitting: Interventional Radiology

## 2014-05-11 DIAGNOSIS — D496 Neoplasm of unspecified behavior of brain: Secondary | ICD-10-CM

## 2014-05-11 DIAGNOSIS — G8389 Other specified paralytic syndromes: Secondary | ICD-10-CM

## 2014-05-11 DIAGNOSIS — D329 Benign neoplasm of meninges, unspecified: Secondary | ICD-10-CM

## 2014-05-11 LAB — TYPE AND SCREEN
ABO/RH(D): B POS
Antibody Screen: NEGATIVE
UNIT DIVISION: 0
UNIT DIVISION: 0
UNIT DIVISION: 0
UNIT DIVISION: 0
Unit division: 0
Unit division: 0
Unit division: 0
Unit division: 0

## 2014-05-11 NOTE — Progress Notes (Signed)
Patient ID: Geoffrey Neal, male   DOB: [birth year], 69 y.o.   MRN: [id number] Doing well. Talking. Will remove drain in am

## 2014-05-11 NOTE — Progress Notes (Signed)
Again pt will answer Dr. Joya Salm "Geoffrey Neal" when RN unable to illict verbal response.

## 2014-05-11 NOTE — Consult Note (Signed)
Physical Medicine and Rehabilitation Consult Reason for Consult: Meningioma Referring Physician: Dr. Estanislado Pandy   HPI: Geoffrey Neal is a 69 y.o. right handed non-English speaking male with history of approximately 2 months right frontal scalp mass with intermittent headaches and blurred vision. 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 Patient admitted 05/08/2013 underwent cerebral angiogram with embolization of the mass followed by bilateral frontoparietal craniectomy for tumor excision 05/08/2014 per Dr. Cyndy Freeze. Maintained on Decadron protocol. Keppra for seizure prophylaxis. Subcutaneous heparin initiated for DVT prophylaxis 05/10/2014. Patient tolerating a dysphagia 2 thin liquid diet. Occupational therapy evaluation completed 05/13/2015 with recommendations of physical medicine and rehabilitation consult.  In bed, wife at bedside   Review of Systems  Unable to perform ROS: language   Past Medical History  Diagnosis Date  . Elevated PSA   . Frequency of urination   . Mixed hyperlipidemia     MILD PER PT  . Nocturia   . DJD (degenerative joint disease)   . Hypothyroidism   . Diabetes mellitus without complication     ?  Marland Kitchen History of kidney stones     yrs ago  . GERD (gastroesophageal reflux disease)     occ   Past Surgical History  Procedure Laterality Date  . Prostate biopsy N/A 07/19/2012    Procedure: EUA, PROSTATE BIOPSY TRANSRECTAL ULTRASONIC PROSTATE (TUBP);  Surgeon: Fredricka Bonine, MD;  Location: [facility];  Service: Urology;  Laterality: N/A;  . Tonsillectomy     History reviewed. No pertinent family history. Social History:  reports that he quit smoking about 33 years ago. His smoking use included Cigarettes. He quit after 15 years of use. He has never used smokeless tobacco.  He reports that he does not drink alcohol or use illicit drugs. Allergies: No Known Allergies Medications Prior to Admission  Medication Sig Dispense Refill  . levothyroxine (SYNTHROID, LEVOTHROID) 175 MCG tablet Take 175 mcg by mouth daily before breakfast.       Home: Home Living Family/patient expects to be discharged to:: Private residence Living Arrangements: Spouse/significant other  Functional History:   Functional Status:  Mobility:          ADL:    Cognition: Cognition Orientation Level: Oriented to person    Blood pressure 130/70, pulse 83, temperature 98.5 F (36.9 C), temperature source Axillary, resp. rate 10, height 5\' 7"  (1.702 m), weight 95.7 kg (210 lb 15.7 oz), SpO2 94 %. Physical Exam  Constitutional:  Dressing in place to craniectomy site  Eyes:  Pupils sluggish to light  Neck: Normal range of motion. Neck supple. No thyromegaly present.  Cardiovascular: Normal rate and regular rhythm.   Respiratory: Effort normal.  Poor inspiratory effort clear to auscultation  GI: Soft. Bowel sounds are normal. He exhibits no distension.  Neurological:  Lethargic but arousable. Exam limited by language barrier. He did attempt some simple words. Inconsistent slow to initiate an limited to motor verbal commands  4/5 In RUE, 0/5 LUE 2- RLE, 0/5 LLE Sensation intact to pinch in all four ext  No results found for this or any previous visit (from the past 24 hour(s)). 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 Postoperatively was not moving lower extremities or left upper extremity. Today  moving both upper extremities an the right lower extremity, with some toe movement. Initial encounter.  EXAM: CT HEAD WITHOUT AND WITH CONTRAST  TECHNIQUE: Contiguous axial images were obtained from the base of the skull through the vertex without and with intravenous  contrast  CONTRAST:  69mL OMNIPAQUE IOHEXOL 300 MG/ML  SOLN  COMPARISON:  Postoperative head CT 05/08/2014.  FINDINGS: Sequelae of vertex craniectomy and repair with percutaneous postoperative drain in place. Scalp hematoma and gas stable to mildly regressed. No new osseous abnormality. Visualized paranasal sinuses and mastoids are clear. Visualized orbit soft tissues are within normal limits.  No ventriculomegaly. Basilar cisterns are patent. There is some streak artifact at the vertex related to the mesh repair of the dura. Underlying resection changes along the surface of the brain. Small volume pneumocephalus and extra-axial fluid or blood. Mild underlying parenchymal hypodensity, favor postoperative in nature. Small volume anterior parafalcine subdural. No new intracranial mass effect. No new intracranial hemorrhage. No evidence of cortically based acute infarction identified. Following contrast, no abnormal intracranial enhancement identified. Major intracranial vascular structures are enhancing.  IMPRESSION: 1. Stable/expected postoperative appearance of the brain status post craniectomy and resection of large bifrontal tumor which had infiltrated the calvarium. 2. Following contrast no residual tumor identified. 3. No new intracranial abnormality identified.   Electronically Signed   By: Lars Pinks M.D.   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 Does the need for close, 24 hr/day medical supervision in concert with the patient's rehab needs make it unreasonable for this patient to be served in a less intensive setting? Yes 2. Co-Morbidities requiring supervision/potential complications: brain tumor, cognitive def, neurogenic bowel and bladder 3. Due to bladder management, bowel management, safety, skin/wound care, disease management, medication administration, pain management and patient education, does the patient require 24 hr/day  rehab nursing? Yes 4. Does the patient require coordinated care of a physician, rehab nurse, PT (1-2 hrs/day, 5 days/week), OT (1-2 hrs/day, 5 days/week) and SLP (.5-1 hrs/day, 5 days/week) to address physical and functional deficits in the context of the above medical diagnosis(es)? Yes Addressing deficits in the following areas: balance, endurance, locomotion, strength, transferring, bowel/bladder control, bathing, dressing, feeding, grooming, toileting, cognition, speech, language and swallowing 5. Can the patient actively participate in an intensive therapy program of at least 3 hrs of therapy per day at least 5 days per week? No 6. The potential for patient to make measurable gains while on inpatient rehab is NA 7. Anticipated functional outcomes upon discharge from inpatient rehab are min assist  with PT, min assist with OT, min assist with SLP. 8. Estimated rehab length of stay to reach the above functional goals is: 20-25d 9. Does the patient have adequate social supports and living environment to accommodate these discharge functional goals? Potentially 10. Anticipated D/C setting: Home 11. Anticipated post D/C treatments: [place] therapy 12. Overall Rehab/Functional Prognosis: fair  RECOMMENDATIONS: This patient's condition is appropriate for continued rehabilitative care in the following setting: CIR once drain out and tolerating therapy out of bed Patient has agreed to participate in recommended program. N/A Note that insurance prior authorization may be required for reimbursement for recommended care.  Comment: Rec Up in chair 3 hours per day    05/11/2014

## 2014-05-11 NOTE — Progress Notes (Signed)
Discussed poor po intake, plan of care with Dr. Joya Salm. He will call daughter with update.

## 2014-05-11 NOTE — Progress Notes (Signed)
Physical medicine and rehabilitation consult requested. At this time patient is unable to participate with formal exam will await formal physical and occupational therapy evaluations and follow-up at that time with appropriate recommendations

## 2014-05-12 MED ORDER — CETYLPYRIDINIUM CHLORIDE 0.05 % MT LIQD
7.0000 mL | Freq: Two times a day (BID) | OROMUCOSAL | Status: DC
Start: 2014-05-12 — End: 2014-05-15
  Administered 2014-05-12 – 2014-05-15 (×6): 7 mL via OROMUCOSAL

## 2014-05-12 NOTE — Evaluation (Signed)
Occupational Therapy Evaluation Patient Details Name: Geoffrey Neal MRN: [id number] DOB: 1945-12-09 Today's Date: 05/12/2014    History of Present Illness Geoffrey Neal is a 69 y.o. male with approximately 2 month history of right frontal scalp mass with intermittent HA's/blurred vision. Subsequent imaging revealed an aggressive extra-axial intracranial mass c/w meningioma with infiltration of the calvarium as well as infiltration/occlusion of the superior sagittal sinus. S/P  BILATERAL FRONTAL PARIETAL craniectomy for TUMOR EXCISION   Clinical Impression   This 69 yo male admitted and underwent above presents to acute OT with decreased initiation of activity, decreased/delayed following commands, decreased mobility, lethargy, decreased balance, decreased AROM RUE and Bil LEs, decreased verbalizations all affecting his ability to care for himself at an independent level as he was PTA. He will greatly benefit from acute OT with follow up on on CIR to get back to as close to baseline as possible to return home with family.    Follow Up Recommendations  CIR    Equipment Recommendations   (TBD at next venue)       Precautions / Restrictions Precautions Precautions: Fall Precaution Comments: Bifrontal craniectomy--NO SKULL FLAP Restrictions Weight Bearing Restrictions: No      Mobility Bed Mobility Overal bed mobility: Needs Assistance;+2 for physical assistance Bed Mobility: Supine to Sit;Sit to Supine     Supine to sit: +2 for physical assistance;Total assist Sit to supine: Total assist;+2 for physical assistance   General bed mobility comments: Without initation from pt  Transfers Overall transfer level: Needs assistance   Transfers: Sit to/from Stand Sit to Stand: +2 physical assistance;Max assist         General transfer comment: Did not initiate, however did A minimally    Balance Overall balance assessment: Needs assistance Sitting-balance support: No upper  extremity supported;Feet supported Sitting balance-Leahy Scale: Zero Sitting balance - Comments: No attempt to self-balance Postural control: Posterior lean;Right lateral lean;Left lateral lean Standing balance support: No upper extremity supported Standing balance-Leahy Scale: Zero Standing balance comment: Miminal attempt to A                            ADL Overall ADL's : Needs assistance/impaired                                       General ADL Comments: total A for all at this time due to delay/decreased command following and no initation of movement except for spontansous movement of RUE     Vision  Pt reports he only sees one finger when one finger is held up. Pt wears glasses for reading only                          Pertinent Vitals/Pain Pain Assessment: Faces Faces Pain Scale: Hurts a little bit Pain Location: stomach--reported to wife that he was gassy Pain Descriptors / Indicators: Cramping Pain Intervention(s): Monitored during session     Hand Dominance Right   Extremity/Trunk Assessment Upper Extremity Assessment Upper Extremity Assessment: LUE deficits/detail LUE Deficits / Details: No AROM noted, did repond to pain on LUE with "ouch" but did not withdraw to pain LUE Coordination: decreased fine motor;decreased gross motor           Communication Communication Communication: No difficulties (very outgoing and talkative)   Cognition Arousal/Alertness: Lethargic Behavior During  Therapy: Flat affect Overall Cognitive Status: Impaired/Different from baseline Area of Impairment: Orientation;Attention;Memory;Following commands;Safety/judgement;Problem solving   Current Attention Level: Focused   Following Commands: Follows one step commands inconsistently;Follows one step commands with increased time (Pt very delayed in answering questions and at times, did not answer questions) Safety/Judgement: Decreased awareness of  deficits (sitting balance--no righting reaction)   Problem Solving: Slow processing;Decreased initiation General Comments: He would not respond to student SLP interpreter at all, but would respond to to his wife intermittently after the student SLP interpreted for [place] to the wife. He had one spontaneous verbalization where he c/o pain under his left arm when we were workng on standing with him. He named his wife, and his 5 kids names as well as his name with increased time and low voice              Home Living Family/patient expects to be discharged to:: Private residence Living Arrangements: Spouse/significant other Available Help at Discharge: Family;Available 24 hours/day Type of Home: House                                  Prior Functioning/Environment Level of Independence: Independent             OT Diagnosis: Generalized weakness;Cognitive deficits;Hemiplegia non-dominant side   OT Problem List: Decreased strength;Decreased range of motion;Impaired balance (sitting and/or standing);Decreased activity tolerance;Decreased cognition;Decreased coordination;Impaired tone;Obesity;Impaired UE functional use   OT Treatment/Interventions: Self-care/ADL training;Neuromuscular education;DME and/or AE instruction;Therapeutic activities;Cognitive remediation/compensation;Balance training;Patient/family education    OT Goals(Current goals can be found in the care plan section) Acute Rehab OT Goals Patient Stated Goal: To get back to his old self (wife) OT Goal Formulation: With family Time For Goal Achievement: 05/26/14 Potential to Achieve Goals: Good  OT Frequency: Min 3X/week           Co-evaluation PT/OT/SLP Co-Evaluation/Treatment: Yes Reason for Co-Treatment: Complexity of the patient's impairments (multi-system involvement);For patient/therapist safety   OT goals addressed during session: Strengthening/ROM      End of Session Equipment Utilized During  Treatment: Gait belt Nurse Communication: Mobility status (Pt's repsonse to therapy)  Activity Tolerance: Patient limited by lethargy Patient left: in bed;with family/visitor present   Time: 3154-0086 OT Time Calculation (min): 44 min Charges:  OT General Charges $OT Visit: 1 Procedure OT Evaluation $Initial OT Evaluation Tier I: 1 Procedure OT Treatments $Self Care/Home Management : 8-22 mins  Almon Register [phone] 05/12/2014, 2:46 PM

## 2014-05-12 NOTE — Progress Notes (Signed)
Patient ID: Geoffrey Neal, male   DOB: [birth year], 69 y.o.   MRN: [id number] BP 129/77 mmHg  Pulse 76  Temp(Src) 97.3 F (36.3 C) (Oral)  Resp 19  Ht 5\' 7"  (1.702 m)  Wt 95.7 kg (210 lb 15.7 oz)  BMI 33.04 kg/m2  SpO2 91% Alert, non fluent, speech is clear Perrl, full eom Moving right side more than left Awaiting implant.

## 2014-05-12 NOTE — Progress Notes (Signed)
"  Cuando me opereron?" Patient speaking to me this AM. Breakfast too coarse. Will change to D2 chopped.

## 2014-05-12 NOTE — Evaluation (Signed)
Physical Therapy Evaluation Patient Details Name: Geoffrey Neal MRN: [id number] DOB: 08-Aug-1945 Today's Date: 05/12/2014   History of Present Illness  pt presents with approximately 2 month history of right frontal scalp mass with intermittent HA's/blurred vision. Imaging revealed aggressive extra-axial intracranial mass c/w meningioma with infiltration of the calvarium as well as infiltration/occlusion of the superior sagittal sinus. S/P  BILATERAL FRONTAL PARIETAL craniectomy for TUMOR EXCISION  Clinical Impression  Pt with difficulty communicating even with wife and interpreter present.  Pt seems to have delayed responses if he responds to questions at all.  Feel pt may benefit from helmet during mobility and perhaps while in bed as pt needed frequent re-directing about not placing his arm on his head or touching his dressing.  At this time feel pt will need CIR at D/C to maximize independence prior to returning to home with family.  Will continue to follow while on acute.      Follow Up Recommendations CIR    Equipment Recommendations   (TBD)    Recommendations for Other Services Rehab consult     Precautions / Restrictions Precautions Precautions: Fall Precaution Comments: Bifrontal craniectomy--NO SKULL FLAP Restrictions Weight Bearing Restrictions: No      Mobility  Bed Mobility Overal bed mobility: Needs Assistance;+2 for physical assistance Bed Mobility: Supine to Sit;Sit to Supine     Supine to sit: +2 for physical assistance;Total assist Sit to supine: Total assist;+2 for physical assistance   General bed mobility comments: Without initation from pt  Transfers Overall transfer level: Needs assistance Equipment used: 2 person hand held assist Transfers: Sit to/from Stand Sit to Stand: +2 physical assistance;Max assist         General transfer comment: PT/OT needed to initiate mobility, but pt did then participate in coming to partial stand and remained in  flexed posture.  Repeated transfer x2.    Ambulation/Gait                Stairs            Wheelchair Mobility    Modified Rankin (Stroke Patients Only)       Balance Overall balance assessment: Needs assistance Sitting-balance support: No upper extremity supported;Feet supported Sitting balance-Leahy Scale: Zero Sitting balance - Comments: No attempt to self-balance Postural control: Posterior lean;Right lateral lean;Left lateral lean Standing balance support: No upper extremity supported Standing balance-Leahy Scale: Zero Standing balance comment: Miminal attempt to A                             Pertinent Vitals/Pain Pain Assessment: Faces Faces Pain Scale: Hurts a little bit Pain Location: stomach- told wife he felt gassy Pain Descriptors / Indicators: Cramping Pain Intervention(s): Monitored during session;Repositioned    Home Living Family/patient expects to be discharged to:: Inpatient rehab Living Arrangements: Spouse/significant other Available Help at Discharge: Family;Available 24 hours/day Type of Home: House                Prior Function Level of Independence: Independent               Hand Dominance   Dominant Hand: Right    Extremity/Trunk Assessment   Upper Extremity Assessment: Defer to OT evaluation       LUE Deficits / Details: No AROM noted, did repond to pain on LUE with "ouch" but did not withdraw to pain   Lower Extremity Assessment: LLE deficits/detail   LLE Deficits / Details:  pt PROM WFL, but no active movement noted.  Pain sensation intact with pt responding "Ouch", but no withdraw from pain.       Communication   Communication: Astronomer utilized Theatre stage manager [place] and Research officer, political party) interpreted)  Cognition Arousal/Alertness: Lethargic Behavior During Therapy: Flat affect Overall Cognitive Status: Impaired/Different from baseline Area of Impairment: Orientation;Attention;Memory;Following  commands;Safety/judgement;Awareness;Problem solving Orientation Level: Disoriented to;Place;Time;Situation Current Attention Level: Focused Memory: Decreased short-term memory Following Commands: Follows one step commands inconsistently;Follows one step commands with increased time Safety/Judgement: Decreased awareness of deficits Awareness: Intellectual Problem Solving: Slow processing;Decreased initiation;Difficulty sequencing;Requires verbal cues;Requires tactile cues General Comments: pt would not respond to student interpreter at all, but would respond to wife at times, but not consistently.  Only spontaneously verbalized one time in response to pain.  pt was able to state his wife's name and the names of his 5 children.      General Comments      Exercises        Assessment/Plan    PT Assessment Patient needs continued PT services  PT Diagnosis Difficulty walking;Hemiplegia non-dominant side   PT Problem List Decreased strength;Decreased activity tolerance;Decreased balance;Decreased mobility;Decreased coordination;Decreased cognition;Decreased knowledge of use of DME;Decreased safety awareness  PT Treatment Interventions DME instruction;Gait training;Functional mobility training;Therapeutic activities;Therapeutic exercise;Balance training;Neuromuscular re-education;Cognitive remediation;Patient/family education   PT Goals (Current goals can be found in the Care Plan section) Acute Rehab PT Goals Patient Stated Goal: To get back to his old self (wife) PT Goal Formulation: With family Time For Goal Achievement: 05/26/14 Potential to Achieve Goals: Good    Frequency Min 3X/week   Barriers to discharge        Co-evaluation PT/OT/SLP Co-Evaluation/Treatment: Yes Reason for Co-Treatment: Complexity of the patient's impairments (multi-system involvement);For patient/therapist safety PT goals addressed during session: Mobility/safety with mobility;Balance;Strengthening/ROM OT  goals addressed during session: Strengthening/ROM       End of Session Equipment Utilized During Treatment: Gait belt Activity Tolerance: Patient limited by lethargy Patient left: in bed;with call bell/phone within reach;with bed alarm set;with family/visitor present Nurse Communication: Mobility status         Time: 8938-1017 PT Time Calculation (min) (ACUTE ONLY): 44 min   Charges:   PT Evaluation $Initial PT Evaluation Tier I: 1 Procedure     PT G CodesCatarina Hartshorn, [place] 05/12/2014, 3:04 PM

## 2014-05-12 NOTE — Progress Notes (Signed)
UR completed.  Myldred Raju, RN BSN MHA CCM Trauma/Neuro ICU Case Manager [phone]  

## 2014-05-12 NOTE — Progress Notes (Signed)
Rehab Admissions Coordinator Note:  Patient was screened by Retta Diones for appropriateness for an Inpatient Acute Rehab Consult.  At this time, an inpatient rehab consult has been ordered and is pending completion.  Noted OT recommending inpatient rehab.  Danne Baxter will follow up once consult is completed.  She can be reached at [phone].  Retta Diones 05/12/2014, 2:32 PM

## 2014-05-13 LAB — GLUCOSE, CAPILLARY
GLUCOSE-CAPILLARY: 136 mg/dL — AB (ref 70–99)
Glucose-Capillary: 137 mg/dL — ABNORMAL HIGH (ref 70–99)
Glucose-Capillary: 177 mg/dL — ABNORMAL HIGH (ref 70–99)

## 2014-05-13 MED ORDER — INSULIN ASPART 100 UNIT/ML ~~LOC~~ SOLN
2.0000 [IU] | Freq: Three times a day (TID) | SUBCUTANEOUS | Status: DC
  Administered 2014-05-14 (×2): 2 [IU] via SUBCUTANEOUS
  Administered 2014-05-14: 6 [IU] via SUBCUTANEOUS
  Administered 2014-05-15 (×2): 4 [IU] via SUBCUTANEOUS
  Administered 2014-05-16: 2 [IU] via SUBCUTANEOUS

## 2014-05-13 MED ORDER — CHLORPROMAZINE HCL 25 MG PO TABS
25.0000 mg | ORAL_TABLET | Freq: Three times a day (TID) | ORAL | Status: DC | PRN
  Administered 2014-05-13 – 2014-05-17 (×3): 25 mg via ORAL
  Filled 2014-05-13 (×4): qty 1

## 2014-05-13 NOTE — Progress Notes (Signed)
Patient ID: Geoffrey Neal, male   DOB: Dec 14, 1945, 69 y.o.   MRN: [id number] BP 132/84 mmHg  Pulse 66  Temp(Src) 99 F (37.2 C) (Oral)  Resp 17  Ht 5\' 7"  (1.702 m)  Wt 95.7 kg (210 lb 15.7 oz)  BMI 33.04 kg/m2  SpO2 96% Alert, following commands Speech is clear, non fluent Perrl, full eom Incision clean, dry, without signs of infection Doing well Flap will be ready on Jan 28 May be a rehab candidate.  Removed subgaleal flap.

## 2014-05-13 NOTE — Progress Notes (Signed)
Physical Therapy Treatment Patient Details Name: Geoffrey Neal MRN: [id number] DOB: Feb 14, 1946 Today's Date: 05/13/2014    History of Present Illness pt presents with approximately 2 month history of right frontal scalp mass with intermittent HA's/blurred vision. Imaging revealed aggressive extra-axial intracranial mass c/w meningioma with infiltration of the calvarium as well as infiltration/occlusion of the superior sagittal sinus. S/P  BILATERAL FRONTAL PARIETAL craniectomy for TUMOR EXCISION    PT Comments    Pt responding more as session progresses.  He does not appear to be consistently holding conversation even in [place].  Emphasis today on truncal activation in sitting and tolerance to sitting plus standing trials to further activate trunk and limbs.  Progress remains slow.  Follow Up Recommendations  CIR     Equipment Recommendations   (TBD)    Recommendations for Other Services Rehab consult     Precautions / Restrictions Precautions Precautions: Fall Precaution Comments: Bifrontal craniectomy--NO SKULL FLAP Restrictions Weight Bearing Restrictions: No    Mobility  Bed Mobility Overal bed mobility: +2 for physical assistance Bed Mobility: Supine to Sit;Sit to Supine     Supine to sit: +2 for physical assistance;Total assist Sit to supine: Total assist;+2 for physical assistance   General bed mobility comments: Without initation from pt  Transfers Overall transfer level: Needs assistance Equipment used: 2 person hand held assist (chair back) Transfers: Sit to/from Stand Sit to Stand: +2 physical assistance;Max assist         General transfer comment: sit to stand x2.  Help for initiation, coming forward and lift.  Ambulation/Gait                 Stairs            Wheelchair Mobility    Modified Rankin (Stroke Patients Only)       Balance Overall balance assessment: Needs assistance Sitting-balance support: No upper extremity  supported Sitting balance-Leahy Scale: Poor Sitting balance - Comments: variable from total to minimal assist with 1 episode of min guard immediately upon sitting from standing trial.  Sat EOB ~15 working on truncal activation, balance with head upright   Standing balance support: Bilateral upper extremity supported Standing balance-Leahy Scale: Zero Standing balance comment: standing trials x2 with 1st trial to activate and 2nd trial attained full upright stance with min pt LE and truncal assist                    Cognition Arousal/Alertness: Awake/alert Behavior During Therapy: Flat affect Overall Cognitive Status: Impaired/Different from baseline Area of Impairment: Orientation;Attention;Memory;Following commands;Safety/judgement;Awareness;Problem solving Orientation Level: Disoriented to;Place;Time;Situation Current Attention Level: Focused Memory: Decreased short-term memory Following Commands: Follows one step commands inconsistently;Follows one step commands with increased time Safety/Judgement: Decreased awareness of deficits Awareness: Intellectual Problem Solving: Slow processing;Decreased initiation;Difficulty sequencing;Requires verbal cues;Requires tactile cues General Comments: Pt would respond to simple spanish/english commands "head up", but did not try to communicate with me    Exercises      General Comments        Pertinent Vitals/Pain Pain Assessment: Faces Faces Pain Scale: No hurt    Home Living                      Prior Function            PT Goals (current goals can now be found in the care plan section) Acute Rehab PT Goals Patient Stated Goal: To get back to his old self (wife) PT Goal  Formulation: With family Time For Goal Achievement: 05/26/14 Potential to Achieve Goals: Good Progress towards PT goals: Progressing toward goals    Frequency  Min 3X/week    PT Plan Current plan remains appropriate    Co-evaluation              End of Session   Activity Tolerance: Patient tolerated treatment well Patient left: in bed;with call bell/phone within reach;with bed alarm set;with family/visitor present     Time: 5697-9480 PT Time Calculation (min) (ACUTE ONLY): 30 min  Charges:  $Therapeutic Activity: 23-37 mins                    G Codes:      Keona Bilyeu, Tessie Fass 05/13/2014, 3:38 PM 05/13/2014  Donnella Sham, PT [phone]  (pager)

## 2014-05-13 NOTE — Plan of Care (Signed)
Problem: Consults Goal: Diagnosis - Craniotomy Tumor     

## 2014-05-13 NOTE — Progress Notes (Signed)
Patient c/o hiccoughs per grandson. Dr. Ronnald Ramp notified. Order received for thorazine to be dosed by pharmacy. Pharmacy recommended 25mg  PO TID PRN. Will order and monitor.

## 2014-05-13 NOTE — Progress Notes (Signed)
I await improved tolerance with therapy and clarification of timing of implant to plan inpt rehab admission. [phone]

## 2014-05-14 LAB — GLUCOSE, CAPILLARY
Glucose-Capillary: 136 mg/dL — ABNORMAL HIGH (ref 70–99)
Glucose-Capillary: 219 mg/dL — ABNORMAL HIGH (ref 70–99)
Glucose-Capillary: 137 mg/dL — ABNORMAL HIGH (ref 70–99)
Glucose-Capillary: 167 mg/dL — ABNORMAL HIGH (ref 70–99)

## 2014-05-14 MED ORDER — SIMETHICONE 80 MG PO CHEW
80.0000 mg | CHEWABLE_TABLET | Freq: Four times a day (QID) | ORAL | Status: DC | PRN
  Administered 2014-05-14: 80 mg via ORAL
  Filled 2014-05-14 (×3): qty 1

## 2014-05-14 NOTE — Progress Notes (Signed)
I met with pt and his wife at bedside to discuss a possible inpt rehab admission with assistance of interpretor Pt and wife asked very appropriate questions I will clarify with Dr. Naaman Plummer the timing of admission to inpt rehab in regards to prior to surgery planned 1/28 or after.I will follow up tomorrow with this dispo. [phone]

## 2014-05-14 NOTE — Progress Notes (Signed)
I have arranged with Interpretor to meet with wife and pt at bedside today at 4 pm . [phone]

## 2014-05-14 NOTE — Progress Notes (Signed)
Patient ID: Geoffrey Neal, male   DOB: 1945/10/17, 69 y.o.   MRN: [id number] BP 137/79 mmHg  Pulse 64  Temp(Src) 98.5 F (36.9 C) (Oral)  Resp 10  Ht 5\' 7"  (1.702 m)  Wt 95.7 kg (210 lb 15.7 oz)  BMI 33.04 kg/m2  SpO2 95% Alert, oriented, moving all extremities right greater than left.  Wound is clean dry, and without signs of infection.  Plan on cranioplasty next week.  Doing well overall.

## 2014-05-14 NOTE — Evaluation (Signed)
Speech Language Pathology Evaluation Patient Details Name: Geoffrey Neal MRN: [id number] DOB: 1945/09/04 Today's Date: 05/14/2014 Time: 0815-0910 SLP Time Calculation (min) (ACUTE ONLY): 55 min  Problem List:  Patient Active Problem List   Diagnosis Date Noted  . Meningioma   . Brain tumor 05/07/2014  . CONSTIPATION 11/16/2009  . 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 HYPERGLYCEMIA 09/29/2008  . 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 GERD 02/18/2007  . VITILIGO 02/11/2007  . HYPOTHYROIDISM 02/04/2007  . DEGENERATIVE JOINT DISEASE 02/04/2007  . ARTHRALGIA 02/04/2007   Past Medical History:  Past Medical History  Diagnosis Date  . Elevated PSA   . Frequency of urination   . Mixed hyperlipidemia     MILD PER PT  . Nocturia   . DJD (degenerative joint disease)   . Hypothyroidism   . Diabetes mellitus without complication     ?  Marland Kitchen History of kidney stones     yrs ago  . GERD (gastroesophageal reflux disease)     occ   Past Surgical History:  Past Surgical History  Procedure Laterality Date  . Prostate biopsy N/A 07/19/2012    Procedure: EUA, PROSTATE BIOPSY TRANSRECTAL ULTRASONIC PROSTATE (TUBP);  Surgeon: Fredricka Bonine, MD;  [facility];  Service: Urology;  Laterality: N/A;  . Tonsillectomy    . Radiology with anesthesia N/A 05/07/2014    Procedure: RADIOLOGY WITH ANESTHESIA/EMBOLIZATION;  Surgeon: Rob Hickman, MD;  Location: [place];  Service: Radiology;  Laterality: N/A;  . Craniotomy Bilateral 05/08/2014    Procedure: BILATERAL FRONTAL PARIETAL CRANIOTOMY TUMOR EXCISION;  Surgeon: Ashok Pall, MD;  Location: [place] [facility];  Service: Neurosurgery;  Laterality: Bilateral;   HPI:  Pt presents with approximately 2 month history of right frontal scalp mass with intermittent HA's/blurred vision. Imaging revealed aggressive extra-axial intracranial mass c/w meningioma with infiltration of the calvarium as well as infiltration/occlusion of the superior  sagittal sinus. S/P bilateral frontal parietal craniectomy for tumor excision. Pt currently has no bone flap. Primary language is Spanish.    Assessment / Plan / Recommendation Clinical Impression  Pt demonstrates improving arousal; speech and language are WNL (assessment completed in Spanish). Cognition is moderately impaired with pt demonstrating delayed responses and poor selective attention to verbal and functional tasks. He is able to make requests for basic needs independently but needs moderate verbal cues to initiate tasks and problem solve new physical deficits. He is able to identify his left UE and attend to the left visual field but he has little itnellectual or emergent awareness of his left sided weakness and often appears to be waiting for left hand to join in with a two handed task. During assessment, SLP offered verbal and contextual cues to complete morning hygiene, help make call to wife. Education was provided to family upon their arrival. Recommend CIR at d/c will continue treatment.     SLP Assessment  Patient needs continued Speech Lanaguage Pathology Services    Follow Up Recommendations  Inpatient Rehab    Frequency and Duration min 3x week  2 weeks   Pertinent Vitals/Pain Pain Assessment: No/denies pain Pain Score: 8  Pain Location: leg cramp Pain Descriptors / Indicators: Cramping Pain Intervention(s): RN gave pain meds during session   SLP Goals  Potential to Achieve Goals (ACUTE ONLY): Good  SLP Evaluation Prior Functioning  Cognitive/Linguistic Baseline: Within functional limits Type of Home: House  Lives With: Spouse Available Help at Discharge: Family;Available 24 hours/day Vocation: Retired   Associate Professor  Overall Cognitive Status:  Impaired/Different from baseline Arousal/Alertness: Awake/alert Orientation Level: Oriented to person;Oriented to place;Oriented to situation;Oriented to time Attention: Focused;Sustained;Selective Focused Attention:  Appears intact Sustained Attention: Impaired Sustained Attention Impairment: Verbal basic;Functional basic Selective Attention: Impaired Selective Attention Impairment: Verbal basic;Functional basic Awareness: Impaired Awareness Impairment: Emergent impairment;Anticipatory impairment;Intellectual impairment Problem Solving: Impaired Problem Solving Impairment: Functional basic Executive Function: Sequencing;Self Correcting;Initiating;Organizing;Reasoning Reasoning: Impaired Sequencing: Impaired Organizing: Impaired Initiating: Impaired Self Correcting: Impaired Safety/Judgment: Impaired    Comprehension  Auditory Comprehension Overall Auditory Comprehension: Appears within functional limits for tasks assessed Visual Recognition/Discrimination Discrimination: Within Function Limits    Expression Verbal Expression Overall Verbal Expression: Appears within functional limits for tasks assessed   Oral / Motor Oral Motor/Sensory Function Overall Oral Motor/Sensory Function: Appears within functional limits for tasks assessed Motor Speech Overall Motor Speech: Appears within functional limits for tasks assessed   GO    Herbie Baltimore, MA CCC-SLP [phone]  Lynann Beaver 05/14/2014, 10:13 AM

## 2014-05-14 NOTE — Progress Notes (Signed)
Occupational Therapy Treatment Patient Details Name: Geoffrey Neal MRN: [id number] DOB: [birth year] Today's Date: 05/14/2014    History of present illness pt presents with approximately 2 month history of right frontal scalp mass with intermittent HA's/blurred vision. Imaging revealed aggressive extra-axial intracranial mass c/w meningioma with infiltration of the calvarium as well as infiltration/occlusion of the superior sagittal sinus. S/P  BILATERAL FRONTAL PARIETAL craniectomy for TUMOR EXCISION. Flap surgery to be on 05/21/2014.   OT comments  This male making progress since eval on 05/11/2014 as far as alertness and cognition and following commands; however no active movement is seen in LUE and LLE and pt without sense of balance when sitting EOB, He would greatly benefit from continued acute OT and follow up on CIR.  Follow Up Recommendations  CIR    Equipment Recommendations   (TBD at next venue)       Precautions / Restrictions Precautions Precautions: Fall Precaution Comments: Bifrontal craniectomy--NO SKULL FLAP Restrictions Weight Bearing Restrictions: No       Mobility Bed Mobility Overal bed mobility: Needs Assistance Bed Mobility: Supine to Sit     Supine to sit: Total assist;+2 for physical assistance;HOB elevated     General bed mobility comments: Pt did move RLE when asked to A in moving to EOB  Transfers Overall transfer level: Needs assistance Equipment used: 2 person hand held assist (gait belt and bed pad under him) Transfers: Sit to/from [employer] Sit to Stand: Max assist;+2 physical assistance (not fully upright (only about 1/2))   Squat pivot transfers: Max assist;+2 physical assistance (to pt's right)          Balance Overall balance assessment: Needs assistance Sitting-balance support: Single extremity supported;Feet supported Sitting balance-Leahy Scale: Poor Sitting balance - Comments: variable from total to minimal  assist (if he was holding with RUE to arm of recliner in front and to the right of him). Sat EOB ~15 working on truncal activation and balance Postural control: Posterior lean;Right lateral lean Standing balance support: Bilateral upper extremity supported Standing balance-Leahy Scale: Zero                     ADL Overall ADL's : Needs assistance/impaired     Grooming: Oral care;Min guard;Sitting Grooming Details (indicate cue type and reason): decreased sequencing and problem solving with no functional use of LUE. I placed toothbrush under his left hand while it was on a pillow and he immediately said (in [place]) that he was right handed---had SLP in Spanish explain to him why I had placed the toothbrush under his left hand and he was not understanding it--pulled out toothbrush and  SLP asked him in Spanish to put it back--he had trouble figuring out how to orient toothbrush the way I had had it. He also could not figure out even with his dominant hand how to hold toothopaste and unscrew cap at same time or that he could have removed the toothbrush, put the toothpaste tube there instead to use the weight of his left hand to hold it while he unscrewed the top with his right hand.                 Toilet Transfer: +2 for physical assistance;Maximal assistance;Squat-pivot (going to pt's right using gait belt and bed pad (as sling under him))  Cognition   Behavior During Therapy: Flat affect Overall Cognitive Status: Impaired/Different from baseline Area of Impairment: Attention   Current Attention Level: Sustained (would loose focus/stare off at times and had to be brought back around by saying his first name)    Following Commands: Follows one step commands inconsistently;Follows one step commands with increased time (increased cues verbal and gestural, following one step commands 50% of time with less than 30 second  delay) Safety/Judgement: Decreased awareness of deficits (would let go with right hand on arm of recliner forward and off to his right thus loosing his balance posteriorly and to the right--only attempt self times one)   Problem Solving: Slow processing;Decreased initiation;Difficulty sequencing;Requires verbal cues;Requires tactile cues               Pertinent Vitals/ Pain       Pain Assessment: No/denies pain Pain Score: 8  Pain Location: leg cramp Pain Descriptors / Indicators: Cramping Pain Intervention(s): RN gave pain meds during session  Home Living     Available Help at Discharge: Family;Available 24 hours/day Type of Home: House                              Lives With: Spouse        Frequency Min 2X/week     Progress Toward Goals  OT Goals(current goals can now be found in the care plan section)  Progress towards OT goals: Progressing toward goals     Plan Discharge plan remains appropriate    Co-evaluation    PT/OT/SLP Co-Evaluation/Treatment: Yes Reason for Co-Treatment: Complexity of the patient's impairments (multi-system involvement);Necessary to address cognition/behavior during functional activity;For patient/therapist safety   OT goals addressed during session: ADL's and self-care;Strengthening/ROM      End of Session Equipment Utilized During Treatment: Gait belt   Activity Tolerance Patient tolerated treatment well   Patient Left in chair (wtih SLP continuing to work with him.)   Nurse Communication Mobility status;Need for lift equipment (they will need to put a lift pad under him)        Time: 4825-0037 OT Time Calculation (min): 36 min  Charges: OT General Charges $OT Visit: 1 Procedure OT Treatments $Self Care/Home Management : 8-22 mins $Therapeutic Activity: 8-22 mins  Almon Register [phone] 05/14/2014, 10:20 AM

## 2014-05-14 NOTE — Progress Notes (Signed)
UR completed.  Nattalie Santiesteban, RN BSN [facility] CCM Trauma/Neuro ICU Case Manager [phone]  

## 2014-05-15 ENCOUNTER — Encounter (HOSPITAL_COMMUNITY): Payer: Self-pay | Admitting: *Deleted

## 2014-05-15 LAB — GLUCOSE, CAPILLARY
GLUCOSE-CAPILLARY: 138 mg/dL — AB (ref 70–99)
GLUCOSE-CAPILLARY: 166 mg/dL — AB (ref 70–99)
Glucose-Capillary: 157 mg/dL — ABNORMAL HIGH (ref 70–99)
Glucose-Capillary: 162 mg/dL — ABNORMAL HIGH (ref 70–99)

## 2014-05-15 MED ORDER — HYDROCODONE-ACETAMINOPHEN 5-325 MG PO TABS
1.0000 | ORAL_TABLET | ORAL | Status: DC | PRN
  Administered 2014-05-16 – 2014-05-22 (×7): 1 via ORAL
  Administered 2014-05-22: 2 via ORAL
  Administered 2014-05-23 – 2014-05-24 (×3): 1 via ORAL
  Filled 2014-05-15 (×10): qty 1

## 2014-05-15 MED ORDER — MORPHINE SULFATE 2 MG/ML IJ SOLN: 1.0000 mg | INTRAMUSCULAR | Status: DC | PRN

## 2014-05-15 MED ORDER — SENNA 8.6 MG PO TABS
1.0000 | ORAL_TABLET | Freq: Two times a day (BID) | ORAL | Status: DC
  Administered 2014-05-15 – 2014-05-25 (×20): 8.6 mg via ORAL
  Filled 2014-05-15 (×20): qty 1

## 2014-05-15 MED ORDER — LABETALOL HCL 5 MG/ML IV SOLN: 10.0000 mg | INTRAVENOUS | Status: DC | PRN

## 2014-05-15 MED ORDER — NALOXONE HCL 0.4 MG/ML IJ SOLN: 0.0800 mg | INTRAMUSCULAR | Status: DC | PRN

## 2014-05-15 MED ORDER — LEVETIRACETAM 500 MG PO TABS
500.0000 mg | ORAL_TABLET | Freq: Two times a day (BID) | ORAL | Status: DC
  Administered 2014-05-15 – 2014-05-25 (×20): 500 mg via ORAL
  Filled 2014-05-15 (×21): qty 1

## 2014-05-15 MED ORDER — ONDANSETRON HCL 4 MG PO TABS: 4.0000 mg | ORAL_TABLET | ORAL | Status: DC | PRN

## 2014-05-15 MED ORDER — ONDANSETRON HCL 4 MG/2ML IJ SOLN: 4.0000 mg | INTRAMUSCULAR | Status: DC | PRN

## 2014-05-15 NOTE — Progress Notes (Signed)
Occupational Therapy Treatment Patient Details Name: Geoffrey Neal MRN: [id number] DOB: 05-29-1945 Today's Date: 05/15/2014    History of present illness pt presents with approximately 2 month history of right frontal scalp mass with intermittent HA's/blurred vision. Imaging revealed aggressive extra-axial intracranial mass c/w meningioma with infiltration of the calvarium as well as infiltration/occlusion of the superior sagittal sinus. S/P  BILATERAL FRONTAL PARIETAL craniectomy for TUMOR EXCISION. Flap surgery to be on 05/21/2014.   OT comments  This 69 yo male making progress, of note today was spontaneous movement of al LUE during brushing teeth. He will continue to benefit from acute OT with follow up on CIR.  Follow Up Recommendations  CIR    Equipment Recommendations   (TBD next venue)       Precautions / Restrictions Precautions Precautions: Fall Precaution Comments: Bifrontal craniectomy--NO SKULL FLAP Restrictions Weight Bearing Restrictions: No       Mobility Bed Mobility Overal bed mobility: Needs Assistance;+2 for physical assistance Bed Mobility: Supine to Sit     Supine to sit: Max assist;+2 for physical assistance     General bed mobility comments: pt moving L LE on command, but unable to move R LE.    Transfers Overall transfer level: Needs assistance Equipment used: 2 person hand held assist Transfers: Sit to/from [facility] Sit to Stand: Max assist;+2 physical assistance Stand pivot transfers: Max assist;+2 physical assistance       General transfer comment: pt able to A with coming to stand and has minimal ability to move R LE.  Repeated transfer x2 to 3-in-1 and then to recliner.      Balance Overall balance assessment: Needs assistance Sitting-balance support: Single extremity supported;Feet supported Sitting balance-Leahy Scale: Poor     Standing balance support: Bilateral upper extremity supported;During functional  activity Standing balance-Leahy Scale: Zero                     ADL Overall ADL's : Needs assistance/impaired     Grooming: Oral care;Wash/dry face;Min guard;Sitting Grooming Details (indicate cue type and reason): decreased sequencing and problem solving with trace functional use of LUE. As I went to place toothpaste under his left hand while it was on a pillow his left hand automatically tried to hold onto it then he sponataneously took the top of the toothpaste, but then was at a loss as to what to do next. So I removed the toothpaste and place the toothbursh under his left hand at which time he automatically put toothpaste on his toothbrush. He then brushed his teeth, removed his toothbrush from his mouth and was looking around. I finally started offeting itmes that he might be lloking for, when I came to the rinse cup he automatically went to place the toothbrush in his left hand and again his left hand tried to grab hold of it.                 Toilet Transfer: +2 for physical assistance;Maximal assistance;Squat-pivot                              Cognition   Behavior During Therapy: Flat affect Overall Cognitive Status: Impaired/Different from baseline Area of Impairment: Attention;Following commands   Current Attention Level: Sustained    Following Commands: Follows one step commands inconsistently;Follows one step commands with increased time  Pertinent Vitals/ Pain       Pain Assessment:  (gas pains) Faces Pain Scale: No hurt         Frequency Min 2X/week     Progress Toward Goals  OT Goals(current goals can now be found in the care plan section)  Progress towards OT goals: Progressing toward goals  Acute Rehab OT Goals Patient Stated Goal: To get back to his old self (wife)  Plan Discharge plan remains appropriate    Co-evaluation    PT/OT/SLP Co-Evaluation/Treatment: Yes Reason for Co-Treatment: For  patient/therapist safety PT goals addressed during session: Mobility/safety with mobility;Balance OT goals addressed during session: ADL's and self-care;Strengthening/ROM      End of Session Equipment Utilized During Treatment: Gait belt   Activity Tolerance Patient tolerated treatment well   Patient Left in chair;with chair alarm set   Nurse Communication Mobility status;Need for lift equipment        Time: 0927-1002 OT Time Calculation (min): 35 min  Charges: OT General Charges $OT Visit: 1 Procedure OT Treatments $Self Care/Home Management : 8-22 mins  Almon Register [phone] 05/15/2014, 1:54 PM

## 2014-05-15 NOTE — Plan of Care (Signed)
Problem: Consults Goal: Diagnosis - Craniotomy Subdural hematoma     

## 2014-05-15 NOTE — Progress Notes (Signed)
I discussed with Dr. Naaman Plummer on timing of inpt rehab admission. We plan to admit after Cranioplasty next week. Also, no bed available to admit pt to inpt rehab today. Noted surgery 05/21/14. I discussed with RN who will relay to pt and his wife .[phone]

## 2014-05-15 NOTE — Progress Notes (Signed)
Physical Therapy Treatment Patient Details Name: Geoffrey Neal MRN: [id number] DOB: 07/30/1945 Today's Date: 05/15/2014    History of Present Illness pt presents with approximately 2 month history of right frontal scalp mass with intermittent HA's/blurred vision. Imaging revealed aggressive extra-axial intracranial mass c/w meningioma with infiltration of the calvarium as well as infiltration/occlusion of the superior sagittal sinus. S/P  BILATERAL FRONTAL PARIETAL craniectomy for TUMOR EXCISION. Flap surgery to be on 05/21/2014.    PT Comments    Pt participating better with communication and mobility today.  Pt continues to have difficulty with movement on L side, but is following most directions.  Will continue to follow.    Follow Up Recommendations  CIR     Equipment Recommendations   (TBD)    Recommendations for Other Services Rehab consult     Precautions / Restrictions Precautions Precautions: Fall Precaution Comments: Bifrontal craniectomy--NO SKULL FLAP Restrictions Weight Bearing Restrictions: No    Mobility  Bed Mobility Overal bed mobility: Needs Assistance;+2 for physical assistance Bed Mobility: Supine to Sit     Supine to sit: Max assist;+2 for physical assistance     General bed mobility comments: pt moving L LE on command, but unable to move R LE.    Transfers Overall transfer level: Needs assistance Equipment used: 2 person hand held assist Transfers: Sit to/from [facility] Sit to Stand: Max assist;+2 physical assistance Stand pivot transfers: Max assist;+2 physical assistance       General transfer comment: pt able to A with coming to stand and has minimal ability to move R LE.  Repeated transfer x2 to 3-in-1 and then to recliner.    Ambulation/Gait                 Stairs            Wheelchair Mobility    Modified Rankin (Stroke Patients Only)       Balance Overall balance assessment: Needs  assistance Sitting-balance support: Single extremity supported;Feet supported Sitting balance-Leahy Scale: Poor     Standing balance support: Bilateral upper extremity supported;During functional activity Standing balance-Leahy Scale: Zero                      Cognition Arousal/Alertness: Awake/alert Behavior During Therapy: Flat affect Overall Cognitive Status: Impaired/Different from baseline Area of Impairment: Attention;Following commands   Current Attention Level: Sustained   Following Commands: Follows one step commands inconsistently;Follows one step commands with increased time            Exercises      General Comments        Pertinent Vitals/Pain Pain Assessment: Faces Faces Pain Scale: No hurt    Home Living                      Prior Function            PT Goals (current goals can now be found in the care plan section) Acute Rehab PT Goals Patient Stated Goal: To get back to his old self (wife) PT Goal Formulation: With family Time For Goal Achievement: 05/26/14 Potential to Achieve Goals: Good Progress towards PT goals: Progressing toward goals    Frequency  Min 3X/week    PT Plan Current plan remains appropriate    Co-evaluation PT/OT/SLP Co-Evaluation/Treatment: Yes Reason for Co-Treatment: For patient/therapist safety PT goals addressed during session: Mobility/safety with mobility;Balance       End of Session Equipment Utilized During  Treatment: Gait belt Activity Tolerance: Patient tolerated treatment well Patient left: in chair;with call bell/phone within reach;with chair alarm set     Time: 9381-0175 PT Time Calculation (min) (ACUTE ONLY): 34 min  Charges:  $Therapeutic Activity: 8-22 mins                    G CodesCatarina Hartshorn, [place] 05/15/2014, 11:59 AM

## 2014-05-15 NOTE — Progress Notes (Signed)
Pt. Arrived from NICU with nurse and tech. Pt. Is stable and will continue to monitor. [name], [place]  05/15/2014 7:27 PM

## 2014-05-16 LAB — GLUCOSE, CAPILLARY
GLUCOSE-CAPILLARY: 187 mg/dL — AB (ref 70–99)
GLUCOSE-CAPILLARY: 141 mg/dL — AB (ref 70–99)
GLUCOSE-CAPILLARY: 165 mg/dL — AB (ref 70–99)
Glucose-Capillary: 141 mg/dL — ABNORMAL HIGH (ref 70–99)

## 2014-05-16 MED ORDER — DEXAMETHASONE 4 MG PO TABS
4.0000 mg | ORAL_TABLET | Freq: Four times a day (QID) | ORAL | Status: DC
  Administered 2014-05-16 – 2014-05-18 (×10): 4 mg via ORAL
  Filled 2014-05-16 (×9): qty 1

## 2014-05-16 MED ORDER — MAGNESIUM CITRATE PO SOLN
1.0000 | Freq: Once | ORAL | Status: AC
  Administered 2014-05-16: 1 via ORAL
  Filled 2014-05-16: qty 296

## 2014-05-16 MED ORDER — INSULIN ASPART 100 UNIT/ML ~~LOC~~ SOLN
2.0000 [IU] | Freq: Three times a day (TID) | SUBCUTANEOUS | Status: DC
  Administered 2014-05-16: 2 [IU] via SUBCUTANEOUS
  Administered 2014-05-16: 4 [IU] via SUBCUTANEOUS
  Administered 2014-05-17: 2 [IU] via SUBCUTANEOUS
  Administered 2014-05-17: 6 [IU] via SUBCUTANEOUS
  Administered 2014-05-17 – 2014-05-18 (×3): 4 [IU] via SUBCUTANEOUS
  Administered 2014-05-18: 2 [IU] via SUBCUTANEOUS
  Administered 2014-05-19 (×3): 6 [IU] via SUBCUTANEOUS
  Administered 2014-05-20 (×2): 4 [IU] via SUBCUTANEOUS
  Administered 2014-05-20: 6 [IU] via SUBCUTANEOUS
  Administered 2014-05-21: 4 [IU] via SUBCUTANEOUS
  Administered 2014-05-21: 6 [IU] via SUBCUTANEOUS
  Administered 2014-05-21 – 2014-05-23 (×4): 4 [IU] via SUBCUTANEOUS
  Administered 2014-05-23: 6 [IU] via SUBCUTANEOUS
  Administered 2014-05-23: 4 [IU] via SUBCUTANEOUS
  Administered 2014-05-24: 2 [IU] via SUBCUTANEOUS
  Administered 2014-05-24: 4 [IU] via SUBCUTANEOUS
  Administered 2014-05-24 – 2014-05-25 (×2): 2 [IU] via SUBCUTANEOUS

## 2014-05-16 MED ORDER — LEVOTHYROXINE SODIUM 50 MCG PO TABS
175.0000 ug | ORAL_TABLET | Freq: Every day | ORAL | Status: DC
  Administered 2014-05-16 – 2014-05-25 (×10): 175 ug via ORAL
  Filled 2014-05-16 (×30): qty 1

## 2014-05-16 NOTE — Progress Notes (Signed)
Overall stable. No new issues or problems. Pain well controlled. No seizures or other difficulty.  Afebrile. Vitals are stable. Wound clean and dry. Awake and alert. Oriented 3 over 3. Speech fluent. Motor 5/5 bilaterally.  Overall doing well. Plan cranioplasty next week.

## 2014-05-16 NOTE — Progress Notes (Signed)
Patient ID: Geoffrey Neal, male   DOB: 20-Oct-1945, 69 y.o.   MRN: [id number] BP 142/84 mmHg  Pulse 66  Temp(Src) 98.1 F (36.7 C) (Oral)  Resp 18  Ht 5\' 7"  (1.702 m)  Wt 95.7 kg (210 lb 15.7 oz)  BMI 33.04 kg/m2  SpO2 95% Alert and oriented to person, place and situation, along with time Wound is clean, dry, without signs of infection Transferred to floor today Continue with pt , and speech.

## 2014-05-17 LAB — GLUCOSE, CAPILLARY
GLUCOSE-CAPILLARY: 137 mg/dL — AB (ref 70–99)
GLUCOSE-CAPILLARY: 157 mg/dL — AB (ref 70–99)
GLUCOSE-CAPILLARY: 210 mg/dL — AB (ref 70–99)

## 2014-05-17 NOTE — Progress Notes (Signed)
Overall doing well. No new issues or problems. Minimal headache. Patient was able to void today.  Wound clean and dry. Patient awake and alert. Cranial nerve function intact. Motor and sensory function stable.  Doing well following craniotomy and resection of meningioma. Plan cranioplasty later this week.

## 2014-05-18 LAB — GLUCOSE, CAPILLARY
GLUCOSE-CAPILLARY: 136 mg/dL — AB (ref 70–99)
GLUCOSE-CAPILLARY: 210 mg/dL — AB (ref 70–99)
Glucose-Capillary: 235 mg/dL — ABNORMAL HIGH (ref 70–99)
Glucose-Capillary: 153 mg/dL — ABNORMAL HIGH (ref 70–99)
Glucose-Capillary: 165 mg/dL — ABNORMAL HIGH (ref 70–99)

## 2014-05-18 MED ORDER — DEXAMETHASONE 4 MG PO TABS
4.0000 mg | ORAL_TABLET | Freq: Two times a day (BID) | ORAL | Status: DC
  Administered 2014-05-19 – 2014-05-22 (×7): 4 mg via ORAL
  Filled 2014-05-18 (×8): qty 1

## 2014-05-18 NOTE — Progress Notes (Signed)
Patient ID: Geoffrey Neal, male   DOB: 1946-04-01, 69 y.o.   MRN: [id number] BP 123/80 mmHg  Pulse 71  Temp(Src) 98.6 F (37 C) (Oral)  Resp 18  Ht 5\' 7"  (1.702 m)  Wt 95.7 kg (210 lb 15.7 oz)  BMI 33.04 kg/m2  SpO2 98% Alert and oriented x 4, moving all extremities well Improving neurologically Expect implant in 3 days Doing well.

## 2014-05-18 NOTE — Progress Notes (Signed)
UR COMPLETED  

## 2014-05-18 NOTE — Progress Notes (Signed)
Physical Therapy Treatment Patient Details Name: Geoffrey Neal MRN: [id number] DOB: 1945/10/17 Today's Date: 05/18/2014    History of Present Illness pt presents with approximately 2 month history of right frontal scalp mass with intermittent HA's/blurred vision. Imaging revealed aggressive extra-axial intracranial mass c/w meningioma with infiltration of the calvarium as well as infiltration/occlusion of the superior sagittal sinus. S/P  BILATERAL FRONTAL PARIETAL craniectomy for TUMOR EXCISION. Flap surgery to be on 05/21/2014.    PT Comments    Noting good progress with mobility, sitting balance, and standing tolerance; Able to stand with +_2 assist some L LE/knee buckling noted; Initiated gait training with RW which proved difficult as the RW got in the way of supporting L knee; will try 3 musketeers-type gait training next session  Follow Up Recommendations  CIR     Equipment Recommendations  Rolling walker with 5" wheels;3in1 (PT)    Recommendations for Other Services Rehab consult     Precautions / Restrictions Precautions Precautions: Fall Precaution Comments: Bifrontal craniectomy--NO SKULL FLAP Restrictions Weight Bearing Restrictions: No    Mobility  Bed Mobility Overal bed mobility: Needs Assistance Bed Mobility: Rolling;Sidelying to Sit Rolling: Min assist Sidelying to sit: Mod assist       General bed mobility comments: Cues for technqiue and especially tactile cues to finish task (i.e. roll fully onto side and stay in prep for sidelie to sit); noted L UE  push to come to sit; assist provided at pelvic and shoulder  girdles  Transfers Overall transfer level: Needs assistance Equipment used: 2 person hand held assist;Rolling walker (2 wheeled) Transfers: Sit to/from [facility] Sit to Stand: Mod assist;+2 physical assistance Stand pivot transfers: Mod assist;+2 physical assistance       General transfer comment: Cues for tachnique, abnd  tactile cues to initiate and for fully upright standing; Close guard of LLE, no noted buckle RLE; decr stance time LLE with pivot steps to chair; Practiced straight  sit to stand from chair to RW; mod antigravity assist  Ambulation/Gait Ambulation/Gait assistance: +2 safety/equipment;+2 physical assistance;Mod assist Ambulation Distance (Feet): 4 Feet (2 steps) Assistive device: Rolling walker (2 wheeled) Gait Pattern/deviations: Step-to pattern;Decreased step length - left;Decreased stance time - right;Trunk flexed;Ataxic     General Gait Details: 2 steps with assist of 2 and grandson pushing chair behind for safety; LLE tending to flex at hips and knees in stance, requiring assist/support   Stairs            Wheelchair Mobility    Modified Rankin (Stroke Patients Only)       Balance     Sitting balance-Leahy Scale: Poor Sitting balance - Comments: Able to correctr balance with cues to do so; Can hold self up for without assist for approx 30 seconds statically; tending to drift to Left and posteriorly when a task (washing face) is added, requiring min to mod assist to regain balance Postural control: Left lateral lean;Posterior lean   Standing balance-Leahy Scale: Poor                      Cognition Arousal/Alertness: Awake/alert Behavior During Therapy: WFL for tasks assessed/performed Overall Cognitive Status: Impaired/Different from baseline Area of Impairment: Awareness;Problem solving;Safety/judgement;Following commands;Memory   Current Attention Level: Selective Memory: Decreased short-term memory Following Commands: Follows one step commands with increased time Safety/Judgement: Decreased awareness of deficits Awareness: Intellectual Problem Solving: Slow processing;Difficulty sequencing;Requires verbal cues General Comments: Patient with increased communication; Able to translate phrases related to mobility  from [place] to Spanish    Exercises       General Comments        Pertinent Vitals/Pain Pain Assessment: No/denies pain Faces Pain Scale: No hurt    Home Living                      Prior Function            PT Goals (current goals can now be found in the care plan section) Acute Rehab PT Goals Patient Stated Goal: To get back to his old self (wife) Time For Goal Achievement: 05/26/14 Potential to Achieve Goals: Good Progress towards PT goals: Progressing toward goals    Frequency  Min 3X/week    PT Plan Current plan remains appropriate    Co-evaluation PT/OT/SLP Co-Evaluation/Treatment: Yes Reason for Co-Treatment: For patient/therapist safety;Necessary to address cognition/behavior during functional activity PT goals addressed during session: Mobility/safety with mobility       End of Session Equipment Utilized During Treatment: Gait belt Activity Tolerance: Patient tolerated treatment well Patient left: in chair;with call bell/phone within reach;with chair alarm set     Time: 1500-1530 PT Time Calculation (min) (ACUTE ONLY): 30 min  Charges:  $Gait Training: 8-22 mins                    G Codes:      Geoffrey Neal 05/18/2014, 3:56 PM  Geoffrey Neal, [place] Pager [phone] Office [phone]

## 2014-05-18 NOTE — Progress Notes (Signed)
Chaplain initiated visit with pt and family. Pt is supported by a church family and pastor from "[employer]" should be visiting soon. Pt wanted information about his next surgery and what type of material will be used. Chaplain discussed this with pt nurse. Page chaplain as needed.    05/18/14 0900  Clinical Encounter Type  Visited With Patient and family together  Visit Type Initial;Spiritual support  Spiritual Encounters  Spiritual Needs Emotional  Stress Factors  Patient Stress Factors Lack of knowledge  [name] 05/18/2014 9:58 AM

## 2014-05-18 NOTE — Progress Notes (Signed)
Speech Language Pathology Treatment: Cognitive-Linquistic  Patient Details Name: Geoffrey Neal MRN: [id number] DOB: 03-Dec-1945 Today's Date: 05/18/2014 Time: 8250-0370 SLP Time Calculation (min) (ACUTE ONLY): 30 min  Assessment / Plan / Recommendation Clinical Impression  Pt demonstrates improving arousal. Pt was alert and very cooperative. Pt demonstrates short term memory deficits, but can recall information when provided with two answer choices. Pt can also use his environment to recall recent events. Pt demonstrates poor selective attention, despite max cues. During problem solving, pt was distracted and perseverated on medical issues. Pt recalled task instructions, however, was unable to initiate and complete task. Discussed with family members to allow pt to answer questions and let him struggle during tasks so he can problem solve. Discussed with pt how CIR can help him improve his attention, memory and problem solving abilities.    HPI HPI: Pt presents with approximately 2 month history of right frontal scalp mass with intermittent HA's/blurred vision. Imaging revealed aggressive extra-axial intracranial mass c/w meningioma with infiltration of the calvarium as well as infiltration/occlusion of the superior sagittal sinus. S/P bilateral frontal parietal craniectomy for tumor excision. Pt currently has no bone flap. Primary language is Spanish.    Pertinent Vitals Pain Assessment: No/denies pain Faces Pain Scale: No hurt  SLP Plan  Continue with current plan of care    Recommendations                Plan: Continue with current plan of care    GO     Geoffrey Neal 05/18/2014, 3:48 PM

## 2014-05-18 NOTE — Progress Notes (Signed)
Occupational Therapy Treatment Patient Details Name: Geoffrey Neal MRN: [id number] DOB: [birth year] Today's Date: 05/18/2014    History of present illness pt presents with approximately 2 month history of right frontal scalp mass with intermittent HA's/blurred vision. Imaging revealed aggressive extra-axial intracranial mass c/w meningioma with infiltration of the calvarium as well as infiltration/occlusion of the superior sagittal sinus. S/P  BILATERAL FRONTAL PARIETAL craniectomy for TUMOR EXCISION. Flap surgery to be on 05/21/2014.   OT comments  Patient progressing towards OT goals and seems to have made progress since OT evaluation. Continue plan of care for now. Some OT goals have been upgraded.    Follow Up Recommendations  CIR    Equipment Recommendations   (tbd)    Recommendations for Other Services  None at this time    Precautions / Restrictions Precautions Precautions: Fall Precaution Comments: Bifrontal craniectomy--NO SKULL FLAP Restrictions Weight Bearing Restrictions: No       Mobility  See PT note     Balance  See PT note   ADL Overall ADL's : Needs assistance/impaired     Grooming: Wash/dry face;Min guard;Sitting Grooming Details (indicate cue type and reason): Patient with increased functional use of LUE during washing face. Patient also with increased cognition, being able to follow one-step commands more consistently   General ADL Comments: Patient with more initiation during ADL. Stood with mod assist +2 (trialed with and without RW)                Cognition   Behavior During Therapy: WFL for tasks assessed/performed Overall Cognitive Status: Impaired/Different from baseline Area of Impairment: Awareness;Problem solving;Safety/judgement;Following commands;Memory   Current Attention Level: Selective Memory: Decreased short-term memory  Following Commands: Follows one step commands with increased time Safety/Judgement: Decreased awareness of  deficits Awareness: Intellectual Problem Solving: Slow processing;Difficulty sequencing;Requires verbal cues General Comments: Patient with increased communication                 Pertinent Vitals/ Pain       Pain Assessment: No/denies pain Faces Pain Scale: No hurt         Frequency Min 2X/week     Progress Toward Goals  OT Goals(current goals can now be found in the care plan section)  Progress towards OT goals: Progressing toward goals  Plan Discharge plan remains appropriate    Co-evaluation      Reason for Co-Treatment: For patient/therapist safety;Necessary to address cognition/behavior during functional activity PT goals addressed during session: Mobility/safety with mobility        End of Session Equipment Utilized During Treatment: Gait belt;Rolling walker   Activity Tolerance Patient tolerated treatment well   Patient Left in chair;with chair alarm set;with family/visitor present   Nurse Communication Other (comment) (need to cut-up food secondary to D2 diet per SLP)      Time: 1301-1330 OT Time Calculation (min): 29 min  Charges: OT General Charges $OT Visit: 1 Procedure OT Treatments $Therapeutic Activity: 8-22 mins  Anne Boltz , MS, OTR/L, CLT Pager: [phone]  05/18/2014, 3:53 PM

## 2014-05-19 LAB — GLUCOSE, CAPILLARY
GLUCOSE-CAPILLARY: 227 mg/dL — AB (ref 70–99)
GLUCOSE-CAPILLARY: 198 mg/dL — AB (ref 70–99)
Glucose-Capillary: 213 mg/dL — ABNORMAL HIGH (ref 70–99)
Glucose-Capillary: 241 mg/dL — ABNORMAL HIGH (ref 70–99)

## 2014-05-19 MED ORDER — ZOLPIDEM TARTRATE 5 MG PO TABS
5.0000 mg | ORAL_TABLET | Freq: Every evening | ORAL | Status: DC | PRN
  Administered 2014-05-19 – 2014-05-24 (×6): 5 mg via ORAL
  Filled 2014-05-19 (×6): qty 1

## 2014-05-19 NOTE — Progress Notes (Signed)
Inpatient Diabetes Program Recommendations  AACE/ADA: New Consensus Statement on Inpatient Glycemic Control (2013)  Target Ranges:  Prepandial:   less than 140 mg/dL      Peak postprandial:   less than 180 mg/dL (1-2 hours)      Critically ill patients:  140 - 180 mg/dL   Results for DEMETRI, GOSHERT (MRN [id number]) as of 05/19/2014 11:04  Ref. Range 05/18/2014 06:24 05/18/2014 10:59 05/18/2014 16:16 05/18/2014 23:01 05/19/2014 06:47  Glucose-Capillary Latest Range: 70-99 mg/dL 136 (H) 153 (H) 165 (H) 210 (H) 213 (H)   Reason for Assessment: elevated CBG  Diabetes history: none Outpatient Diabetes medications: none Current orders for Inpatient glycemic control: Novolog 2-6 units tid with meals  Please consider changing Novolog to moderate correction tid with meals and hs.  Gentry Fitz, RN, BA, MHA, CDE Diabetes Coordinator Inpatient Diabetes Program  [phone] (Team Pager) [phone] [facility]) 05/19/2014 11:06 AM

## 2014-05-19 NOTE — Progress Notes (Signed)
Patient ID: Geoffrey Neal, male   DOB: 07/23/1945, 69 y.o.   MRN: [id number] BP 120/62 mmHg  Pulse 75  Temp(Src) 98 F (36.7 C) (Oral)  Resp 18  Ht 5\' 7"  (1.702 m)  Wt 95.7 kg (210 lb 15.7 oz)  BMI 33.04 kg/m2  SpO2 100% Alert and oriented Moving all extremities Awaiting implant, or probably on friday

## 2014-05-19 NOTE — Progress Notes (Signed)
Physical Therapy Treatment Patient Details Name: Geoffrey Neal MRN: [id number] DOB: 1945-07-19 Today's Date: 05/19/2014    History of Present Illness pt presents with approximately 2 month history of right frontal scalp mass with intermittent HA's/blurred vision. Imaging revealed aggressive extra-axial intracranial mass c/w meningioma with infiltration of the calvarium as well as infiltration/occlusion of the superior sagittal sinus. S/P  BILATERAL FRONTAL PARIETAL craniectomy for TUMOR EXCISION. Flap surgery to be on 05/21/2014.    PT Comments    Patient using English more today; Spanish at times. Remains impulsive and LLE with no active hip flexion for advancing LLE in gait/transfers. Perseverated on need to use bathroom during session.  Follow Up Recommendations  CIR (after cranioplasty 1/28)     Equipment Recommendations   (TBA)    Recommendations for Other Services       Precautions / Restrictions Precautions Precautions: Fall Precaution Comments: Bifrontal craniectomy--NO SKULL FLAP    Mobility  Bed Mobility Overal bed mobility: Needs Assistance Bed Mobility: Rolling;Sidelying to Sit;Sit to Supine Rolling: Supervision Sidelying to sit: Mod assist   Sit to supine: Mod assist;+2 for physical assistance   General bed mobility comments: vc and visual cues for rolling with rail; unable to advance his LLE on/off bed; assist to torso due to weakness  Transfers Overall transfer level: Needs assistance Equipment used: None Transfers: Sit to/from [employer] Transfers Sit to Stand: Mod assist Stand pivot transfers: Mod assist;+2 physical assistance       General transfer comment: assist for LLE position for set up; pivot to his Rt even with BSC and then toilet he did not initiate stepping automatically and needed max verbal and tactile cues for RLE advancement and total assist for LLE advancement (fewer episodes knee bucling); x 6 reps (both Lt and  Rt)  Ambulation/Gait Ambulation/Gait assistance: Max assist;+2 physical assistance Ambulation Distance (Feet): 4 Feet   Gait Pattern/deviations: Step-through pattern;Decreased step length - left;Decreased stance time - left;Decreased dorsiflexion - left;Decreased weight shift to right;Leaning posteriorly     General Gait Details: PT in front with pt's hands on her shoulders with tech behind pt; pt with no active hip flexion and no attempts to advance LLE himself;    Science writer    Modified Rankin (Stroke Patients Only)       Balance Overall balance assessment: Needs assistance Sitting-balance support: Bilateral upper extremity supported;Feet supported Sitting balance-Leahy Scale: Poor     Standing balance support: Bilateral upper extremity supported Standing balance-Leahy Scale: Zero Standing balance comment: strong posterior bias                    Cognition Arousal/Alertness: Awake/alert Behavior During Therapy: Impulsive Overall Cognitive Status: Impaired/Different from baseline Area of Impairment: Awareness;Problem solving;Safety/judgement;Following commands;Memory;Attention   Current Attention Level: Sustained (internally distracted by foley catheter) Memory: Decreased short-term memory Following Commands: Follows one step commands with increased time Safety/Judgement: Decreased awareness of deficits;Decreased awareness of safety Awareness: Intellectual Problem Solving: Slow processing;Difficulty sequencing;Requires verbal cues;Requires tactile cues General Comments: most of session spoke English; occ responded to Spanish instructions more quickly; poor safety awareness with repeated attempts to stand on his own    Exercises General Exercises - Lower Extremity Hip Flexion/Marching: PROM;Left;Other reps (comment);Seated    General Comments General comments (skin integrity, edema, etc.): pt perseverated on foley catheter and  wanting to have a BM; attempted BSC, toilet (rolled into BR in recliner), and ultimately pt wanted  bedpan      Pertinent Vitals/Pain Pain Assessment: Faces Faces Pain Scale: Hurts little more Pain Location: stomach and back of head Pain Intervention(s): Limited activity within patient's tolerance;Monitored during session;Repositioned    Home Living                      Prior Function            PT Goals (current goals can now be found in the care plan section) Acute Rehab PT Goals Patient Stated Goal: To get back to his old self (wife) Progress towards PT goals: Progressing toward goals    Frequency  Min 3X/week    PT Plan Current plan remains appropriate    Co-evaluation             End of Session Equipment Utilized During Treatment: Gait belt Activity Tolerance: Patient tolerated treatment well Patient left: in bed;with call bell/phone within reach;with bed alarm set     Time: 1021-1058 PT Time Calculation (min) (ACUTE ONLY): 37 min  Charges:  $Therapeutic Activity: 23-37 mins                    G Codes:      Yvett Rossel 06/07/2014, 11:22 AM Pager [phone]

## 2014-05-19 NOTE — Progress Notes (Signed)
Hopeful to admit pt to inpt rehab after cranioplasty. Pt and wife are in agreement. [phone]

## 2014-05-20 LAB — GLUCOSE, CAPILLARY
GLUCOSE-CAPILLARY: 169 mg/dL — AB (ref 70–99)
Glucose-Capillary: 197 mg/dL — ABNORMAL HIGH (ref 70–99)
Glucose-Capillary: 251 mg/dL — ABNORMAL HIGH (ref 70–99)
Glucose-Capillary: 232 mg/dL — ABNORMAL HIGH (ref 70–99)
Glucose-Capillary: 209 mg/dL — ABNORMAL HIGH (ref 70–99)

## 2014-05-20 NOTE — Progress Notes (Signed)
Speech Language Pathology Treatment: Cognitive-Linquistic  Patient Details Name: Geoffrey Neal MRN: [id number] DOB: Sep 04, 1945 Today's Date: 05/20/2014 Time: 6767-2094 SLP Time Calculation (min) (ACUTE ONLY): 33 min  Assessment / Plan / Recommendation Clinical Impression  Pt making excellent progress today with working memory, sustained attention and problem solving during basic cognitive tasks. Recalling recent events well. Pt needed min verbal cues to slow down and take steps one at a time, recognize errors, use compensatory strategies for memory. Requested pt have family bring in some puzzles and games he enjoys at home.    HPI HPI: Pt presents with approximately 2 month history of right frontal scalp mass with intermittent HA's/blurred vision. Imaging revealed aggressive extra-axial intracranial mass c/w meningioma with infiltration of the calvarium as well as infiltration/occlusion of the superior sagittal sinus. S/P bilateral frontal parietal craniectomy for tumor excision. Pt currently has no bone flap. Primary language is Spanish.    Pertinent Vitals Pain Assessment: 0-10 Pain Score: 6  Pain Location: head Pain Descriptors / Indicators: Headache Pain Intervention(s): Patient requesting pain meds-RN notified  SLP Plan  Continue with current plan of care    Recommendations                Plan: Continue with current plan of care    GO    [facility] MA CCC-SLP [phone]  Lynann Beaver 05/20/2014, 10:53 AM

## 2014-05-20 NOTE — Progress Notes (Signed)
Occupational Therapy Treatment Patient Details Name: Geoffrey Neal MRN: [id number] DOB: April 27, 1945 Today's Date: 05/20/2014    History of present illness pt presents with approximately 2 month history of right frontal scalp mass with intermittent HA's/blurred vision. Imaging revealed aggressive extra-axial intracranial mass c/w meningioma with infiltration of the calvarium as well as infiltration/occlusion of the superior sagittal sinus. S/P  BILATERAL FRONTAL PARIETAL craniectomy for TUMOR EXCISION. Flap surgery to be on 05/21/2014.   OT comments  Patient is exceeding goals, upgraded OT goals. Plan is for cranioplasty tomorrow (1/28). Continue to recommend CIR to increase independence, believe patient can reach overall supervision>mod I level with extensive and comprehensive rehabilitation.   Follow Up Recommendations  CIR;Supervision/Assistance - 24 hour (after cranioplasty 1/28)    Equipment Recommendations   (TBD)    Recommendations for Other Services  None at this time    Precautions / Restrictions Precautions Precautions: Fall Precaution Comments: Bifrontal craniectomy--NO SKULL FLAP Restrictions Weight Bearing Restrictions: No       Mobility Bed Mobility Overal bed mobility: Needs Assistance Bed Mobility: Rolling;Sidelying to Sit;Supine to Sit Rolling: Supervision Sidelying to sit: Min assist Supine to sit: Min assist Sit to supine: Min assist   General bed mobility comments: required instructional cueing and encouragement to get LLE into bed   Transfers General transfer comment: No transfer during OT treatment    Balance Overall balance assessment: Needs assistance Sitting-balance support: Feet supported;No upper extremity supported Sitting balance-Leahy Scale: Good Sitting balance - Comments: Patient able to maintain dynamic sitting balance during LUE exercises without assistance from therapist.     ADL Lower Body Dressing: Moderate assistance;Cueing for  safety                 General ADL Comments: Patient able to bring RLE to self for LB ADLs, patient requries assistance to manage LLE. Patient able to engage in bed mobility with min assist (1 person). Patient easily distracted and with poor attention during this treatment. No transfer performed secondary to nursing waiting to give patient sponge bath in bed.                 Cognition   Behavior During Therapy: Restless Overall Cognitive Status: Impaired/Different from baseline Area of Impairment: Attention;Memory;Following commands;Safety/judgement;Awareness;Problem solving   Current Attention Level: Sustained Memory: Decreased short-term memory  Following Commands: Follows one step commands with increased time Safety/Judgement: Decreased awareness of deficits;Decreased awareness of safety Awareness: Intellectual Problem Solving: Slow processing;Difficulty sequencing;Requires verbal cues;Requires tactile cues                 Pertinent Vitals/ Pain       Pain Assessment: Faces Pain Score: 0-No pain Faces Pain Scale: No hurt Pain Location: head Pain Descriptors / Indicators: Headache Pain Intervention(s): Patient requesting pain meds-RN notified         Frequency Min 2X/week     Progress Toward Goals  OT Goals(current goals can now be found in the care plan section)  Progress towards OT goals: Progressing toward goals     Plan Discharge plan remains appropriate          Activity Tolerance Patient tolerated treatment well   Patient Left in bed;with call bell/phone within reach     Time: 1101-1117 OT Time Calculation (min): 16 min  Charges: OT General Charges $OT Visit: 1 Procedure OT Treatments $Therapeutic Activity: 8-22 mins  Brandee Markin , MS, OTR/L, CLT Pager: [phone]  05/20/2014, 11:25 AM

## 2014-05-20 NOTE — Progress Notes (Signed)
Pt CBG rechecked, 232.  Will continue to monitor. Cori Razor, RN

## 2014-05-20 NOTE — Progress Notes (Addendum)
Pt CBG 251.  Per verbal MD order give patient 6 units insulin and recheck.  Will continue to monitor. Cori Razor, RN

## 2014-05-20 NOTE — Progress Notes (Signed)
Patient ID: Geoffrey Neal, male   DOB: 1945/08/29, 69 y.o.   MRN: [id number] BP 116/79 mmHg  Pulse 66  Temp(Src) 98.2 F (36.8 C) (Oral)  Resp 18  Ht 5\' 7"  (1.702 m)  Wt 95.7 kg (210 lb 15.7 oz)  BMI 33.04 kg/m2  SpO2 98% Alert and oriented x 4, speech clear and fluent Moving all extremities Wound is clean, dry, and without signs of infection. Awaiting implant arrival.spoke with daughter today.

## 2014-05-21 LAB — GLUCOSE, CAPILLARY
Glucose-Capillary: 163 mg/dL — ABNORMAL HIGH (ref 70–99)
Glucose-Capillary: 166 mg/dL — ABNORMAL HIGH (ref 70–99)
Glucose-Capillary: 287 mg/dL — ABNORMAL HIGH (ref 70–99)
Glucose-Capillary: 183 mg/dL — ABNORMAL HIGH (ref 70–99)

## 2014-05-21 MED ORDER — POLYETHYLENE GLYCOL 3350 17 G PO PACK
17.0000 g | PACK | Freq: Every day | ORAL | Status: DC | PRN
  Administered 2014-05-21 – 2014-05-25 (×3): 17 g via ORAL
  Filled 2014-05-21 (×3): qty 1

## 2014-05-21 NOTE — Progress Notes (Signed)
Pt CBG 287.  MD made aware, verbal order to give patient 6 units insulin and recheck CBG.  Will continue to monitor. Cori Razor, RN

## 2014-05-21 NOTE — Progress Notes (Signed)
UR COMPLETED  

## 2014-05-21 NOTE — Progress Notes (Signed)
Inpatient Diabetes Program Recommendations  AACE/ADA: New Consensus Statement on Inpatient Glycemic Control (2013)  Target Ranges:  Prepandial:   less than 140 mg/dL      Peak postprandial:   less than 180 mg/dL (1-2 hours)      Critically ill patients:  140 - 180 mg/dL   Results for LYNNWOOD, BECKFORD (MRN [id number]) as of 05/21/2014 12:05  Ref. Range 05/20/2014 17:07 05/20/2014 19:01 05/20/2014 21:06 05/21/2014 06:54 05/21/2014 11:52  Glucose-Capillary Latest Range: 70-99 mg/dL 251 (H) 232 (H) 209 (H) 163 (H) 166 (H)    Reason for Assessment: elevated CBG  Diabetes history: none Outpatient Diabetes medications: none Current orders for Inpatient glycemic control: Novolog 2-6 units tid with meals  Please consider changing Novolog to moderate correction tid with meals and hs.  Gentry Fitz, RN, BA, MHA, CDE Diabetes Coordinator Inpatient Diabetes Program  [phone] (Team Pager) [phone] [facility]) 05/19/2014 11:06 AM

## 2014-05-21 NOTE — Progress Notes (Signed)
Patient ID: LAKAI MOREE, male   DOB: 09-Apr-1946, 69 y.o.   MRN: [id number] BP 108/73 mmHg  Pulse 61  Temp(Src) 98.2 F (36.8 C) (Oral)  Resp 18  Ht 5\' 7"  (1.702 m)  Wt 95.7 kg (210 lb 15.7 oz)  BMI 33.04 kg/m2  SpO2 98% Alert and oriented x 4 Moving all extremities, left slightly weaker than the right side.  Perrl, full eom Tongue and uvula midline Mr. Pehl will be taken tomorrow to the operating room for stage ll of his meningioma resection, with a cranioplasty. Risks and benefits were discussed but not limited to bleeding, infection, need for further surgery, and other risks were explained. He and his family understand and wish to proceed.

## 2014-05-22 ENCOUNTER — Inpatient Hospital Stay (HOSPITAL_COMMUNITY): Payer: Medicare Other | Admitting: Anesthesiology

## 2014-05-22 ENCOUNTER — Encounter (HOSPITAL_COMMUNITY)
Admission: RE | Disposition: A | Discharge status: Rehabilitation Facility | Payer: Self-pay | Source: Ambulatory Visit | Attending: Neurosurgery

## 2014-05-22 ENCOUNTER — Encounter (HOSPITAL_COMMUNITY): Payer: Self-pay | Admitting: Certified Registered Nurse Anesthetist

## 2014-05-22 HISTORY — PX: CRANIOPLASTY: SHX1407

## 2014-05-22 LAB — GLUCOSE, CAPILLARY
GLUCOSE-CAPILLARY: 119 mg/dL — AB (ref 70–99)
GLUCOSE-CAPILLARY: 156 mg/dL — AB (ref 70–99)
Glucose-Capillary: 164 mg/dL — ABNORMAL HIGH (ref 70–99)
Glucose-Capillary: 153 mg/dL — ABNORMAL HIGH (ref 70–99)
Glucose-Capillary: 228 mg/dL — ABNORMAL HIGH (ref 70–99)

## 2014-05-22 SURGERY — CRANIOPLASTY
Anesthesia: General

## 2014-05-22 MED ORDER — DEXAMETHASONE SODIUM PHOSPHATE 4 MG/ML IJ SOLN
INTRAMUSCULAR | Status: DC | PRN
  Administered 2014-05-22: 8 mg via INTRAVENOUS

## 2014-05-22 MED ORDER — NEOSTIGMINE METHYLSULFATE 10 MG/10ML IV SOLN
INTRAVENOUS | Status: DC | PRN
  Administered 2014-05-22: 4 mg via INTRAVENOUS

## 2014-05-22 MED ORDER — LIDOCAINE HCL (CARDIAC) 20 MG/ML IV SOLN
INTRAVENOUS | Status: DC | PRN
  Administered 2014-05-22: 70 mg via INTRAVENOUS

## 2014-05-22 MED ORDER — FENTANYL CITRATE 0.05 MG/ML IJ SOLN
INTRAMUSCULAR | Status: DC | PRN
  Administered 2014-05-22: 100 ug via INTRAVENOUS
  Administered 2014-05-22 (×2): 50 ug via INTRAVENOUS

## 2014-05-22 MED ORDER — PROPOFOL 10 MG/ML IV BOLUS
INTRAVENOUS | Status: DC | PRN
Start: 2014-05-22 — End: 2014-05-22
  Administered 2014-05-22: 120 mg via INTRAVENOUS

## 2014-05-22 MED ORDER — 0.9 % SODIUM CHLORIDE (POUR BTL) OPTIME
TOPICAL | Status: DC | PRN
  Administered 2014-05-22 (×2): 1000 mL

## 2014-05-22 MED ORDER — ONDANSETRON HCL 4 MG/2ML IJ SOLN
INTRAMUSCULAR | Status: DC | PRN
  Administered 2014-05-22: 4 mg via INTRAVENOUS

## 2014-05-22 MED ORDER — HYDROCODONE-ACETAMINOPHEN 5-325 MG PO TABS
ORAL_TABLET | ORAL | Status: AC
  Filled 2014-05-22: qty 2

## 2014-05-22 MED ORDER — ROCURONIUM BROMIDE 50 MG/5ML IV SOLN
INTRAVENOUS | Status: AC
  Filled 2014-05-22: qty 1

## 2014-05-22 MED ORDER — ONDANSETRON HCL 4 MG/2ML IJ SOLN: 4.0000 mg | Freq: Once | INTRAMUSCULAR | Status: DC | PRN

## 2014-05-22 MED ORDER — FENTANYL CITRATE 0.05 MG/ML IJ SOLN
INTRAMUSCULAR | Status: AC
  Filled 2014-05-22: qty 2

## 2014-05-22 MED ORDER — GLYCOPYRROLATE 0.2 MG/ML IJ SOLN
INTRAMUSCULAR | Status: DC | PRN
  Administered 2014-05-22: 0.6 mg via INTRAVENOUS

## 2014-05-22 MED ORDER — MORPHINE SULFATE 2 MG/ML IJ SOLN
2.0000 mg | INTRAMUSCULAR | Status: DC | PRN
  Administered 2014-05-22 (×2): 2 mg via INTRAVENOUS
  Filled 2014-05-22 (×2): qty 1

## 2014-05-22 MED ORDER — MIDAZOLAM HCL 2 MG/2ML IJ SOLN
INTRAMUSCULAR | Status: AC
  Filled 2014-05-22: qty 2

## 2014-05-22 MED ORDER — FENTANYL CITRATE 0.05 MG/ML IJ SOLN
INTRAMUSCULAR | Status: AC
  Filled 2014-05-22: qty 5

## 2014-05-22 MED ORDER — ONDANSETRON HCL 4 MG/2ML IJ SOLN
INTRAMUSCULAR | Status: AC
  Filled 2014-05-22: qty 2

## 2014-05-22 MED ORDER — SUCCINYLCHOLINE CHLORIDE 20 MG/ML IJ SOLN
INTRAMUSCULAR | Status: AC
  Filled 2014-05-22: qty 1

## 2014-05-22 MED ORDER — CEFAZOLIN SODIUM-DEXTROSE 2-3 GM-% IV SOLR
2.0000 g | Freq: Once | INTRAVENOUS | Status: AC
  Administered 2014-05-22: 2 g via INTRAVENOUS
  Filled 2014-05-22: qty 50

## 2014-05-22 MED ORDER — ARTIFICIAL TEARS OP OINT
TOPICAL_OINTMENT | OPHTHALMIC | Status: DC | PRN
  Administered 2014-05-22: 1 via OPHTHALMIC

## 2014-05-22 MED ORDER — DEXAMETHASONE 4 MG PO TABS
4.0000 mg | ORAL_TABLET | Freq: Two times a day (BID) | ORAL | Status: AC
  Administered 2014-05-22 – 2014-05-23 (×2): 4 mg via ORAL
  Filled 2014-05-22 (×2): qty 1

## 2014-05-22 MED ORDER — LIDOCAINE HCL (CARDIAC) 20 MG/ML IV SOLN
INTRAVENOUS | Status: AC
  Filled 2014-05-22: qty 5

## 2014-05-22 MED ORDER — LACTATED RINGERS IV SOLN
INTRAVENOUS | Status: DC | PRN
  Administered 2014-05-22: 12:00:00 via INTRAVENOUS

## 2014-05-22 MED ORDER — LACTATED RINGERS IV SOLN
INTRAVENOUS | Status: DC
  Administered 2014-05-22: 11:00:00 via INTRAVENOUS

## 2014-05-22 MED ORDER — BACITRACIN ZINC 500 UNIT/GM EX OINT
TOPICAL_OINTMENT | CUTANEOUS | Status: AC
  Filled 2014-05-22: qty 28.35

## 2014-05-22 MED ORDER — CEFAZOLIN SODIUM-DEXTROSE 2-3 GM-% IV SOLR
2.0000 g | Freq: Three times a day (TID) | INTRAVENOUS | Status: AC
  Administered 2014-05-22 – 2014-05-24 (×5): 2 g via INTRAVENOUS
  Filled 2014-05-22 (×6): qty 50

## 2014-05-22 MED ORDER — CEFAZOLIN SODIUM-DEXTROSE 2-3 GM-% IV SOLR
2.0000 g | Freq: Once | INTRAVENOUS | Status: AC
  Administered 2014-05-22: 2 g via INTRAVENOUS

## 2014-05-22 MED ORDER — ROCURONIUM BROMIDE 100 MG/10ML IV SOLN
INTRAVENOUS | Status: DC | PRN
  Administered 2014-05-22: 10 mg via INTRAVENOUS
  Administered 2014-05-22: 50 mg via INTRAVENOUS

## 2014-05-22 MED ORDER — FENTANYL CITRATE 0.05 MG/ML IJ SOLN: 25.0000 ug | INTRAMUSCULAR | Status: DC | PRN

## 2014-05-22 SURGICAL SUPPLY — 77 items
BANDAGE GAUZE 4  KLING STR (GAUZE/BANDAGES/DRESSINGS) ×2 IMPLANT
BLADE CLIPPER SURG (BLADE) IMPLANT
BNDG GAUZE ELAST 4 BULKY (GAUZE/BANDAGES/DRESSINGS) ×2 IMPLANT
BOWL SPATULA (MISCELLANEOUS) ×1 IMPLANT
BRUSH SCRUB EZ 1% IODOPHOR (MISCELLANEOUS) IMPLANT
CANISTER SUCT 3000ML (MISCELLANEOUS) ×3 IMPLANT
CLIP RANEY DISP (INSTRUMENTS) ×2 IMPLANT
CONT SPEC 4OZ CLIKSEAL STRL BL (MISCELLANEOUS) IMPLANT
CORDS BIPOLAR (ELECTRODE) ×1 IMPLANT
COVER BACK TABLE 60X90IN (DRAPES) IMPLANT
COVER MAYO STAND STRL (DRAPES) ×2 IMPLANT
DECANTER SPIKE VIAL GLASS SM (MISCELLANEOUS) ×1 IMPLANT
DEPRESSOR TONGUE BLADE STERILE (MISCELLANEOUS) IMPLANT
DRAPE NEUROLOGICAL W/INCISE (DRAPES) ×3 IMPLANT
DRAPE SURG IRRIG POUCH 19X23 (DRAPES) ×1 IMPLANT
DRAPE WARM FLUID 44X44 (DRAPE) ×3 IMPLANT
DURAPREP 6ML APPLICATOR 50/CS (WOUND CARE) ×3 IMPLANT
ELECT CAUTERY BLADE 6.4 (BLADE) ×1 IMPLANT
ELECT REM PT RETURN 9FT ADLT (ELECTROSURGICAL) ×3
ELECTRODE REM PT RTRN 9FT ADLT (ELECTROSURGICAL) ×1 IMPLANT
GAUZE SPONGE 4X4 12PLY STRL (GAUZE/BANDAGES/DRESSINGS) ×1 IMPLANT
GAUZE SPONGE 4X4 16PLY XRAY LF (GAUZE/BANDAGES/DRESSINGS) IMPLANT
GLOVE BIO SURGEON STRL SZ 6.5 (GLOVE) IMPLANT
GLOVE BIO SURGEON STRL SZ7 (GLOVE) IMPLANT
GLOVE BIO SURGEON STRL SZ7.5 (GLOVE) IMPLANT
GLOVE BIO SURGEON STRL SZ8 (GLOVE) IMPLANT
GLOVE BIO SURGEON STRL SZ8.5 (GLOVE) IMPLANT
GLOVE BIO SURGEONS STRL SZ 6.5 (GLOVE)
GLOVE BIOGEL M 8.0 STRL (GLOVE) IMPLANT
GLOVE ECLIPSE 6.5 STRL STRAW (GLOVE) ×3 IMPLANT
GLOVE ECLIPSE 7.0 STRL STRAW (GLOVE) IMPLANT
GLOVE ECLIPSE 7.5 STRL STRAW (GLOVE) IMPLANT
GLOVE ECLIPSE 8.0 STRL XLNG CF (GLOVE) IMPLANT
GLOVE ECLIPSE 8.5 STRL (GLOVE) IMPLANT
GLOVE EXAM NITRILE LRG STRL (GLOVE) IMPLANT
GLOVE EXAM NITRILE MD LF STRL (GLOVE) IMPLANT
GLOVE EXAM NITRILE XL STR (GLOVE) IMPLANT
GLOVE EXAM NITRILE XS STR PU (GLOVE) IMPLANT
GLOVE INDICATOR 6.5 STRL GRN (GLOVE) IMPLANT
GLOVE INDICATOR 7.0 STRL GRN (GLOVE) IMPLANT
GLOVE INDICATOR 7.5 STRL GRN (GLOVE) IMPLANT
GLOVE INDICATOR 8.0 STRL GRN (GLOVE) IMPLANT
GLOVE INDICATOR 8.5 STRL (GLOVE) IMPLANT
GLOVE OPTIFIT SS 8.0 STRL (GLOVE) IMPLANT
GLOVE SURG SS PI 6.5 STRL IVOR (GLOVE) IMPLANT
GOWN STRL REUS W/ TWL LRG LVL3 (GOWN DISPOSABLE) ×2 IMPLANT
GOWN STRL REUS W/ TWL XL LVL3 (GOWN DISPOSABLE) IMPLANT
GOWN STRL REUS W/TWL 2XL LVL3 (GOWN DISPOSABLE) IMPLANT
GOWN STRL REUS W/TWL LRG LVL3 (GOWN DISPOSABLE) ×6
GOWN STRL REUS W/TWL XL LVL3 (GOWN DISPOSABLE)
HEMOSTAT SURGICEL 2X14 (HEMOSTASIS) ×1 IMPLANT
KIT BASIN OR (CUSTOM PROCEDURE TRAY) ×3 IMPLANT
KIT ROOM TURNOVER OR (KITS) ×3 IMPLANT
LIQUID BAND (GAUZE/BANDAGES/DRESSINGS) IMPLANT
NDL HYPO 25X1 1.5 SAFETY (NEEDLE) ×1 IMPLANT
NEEDLE HYPO 25X1 1.5 SAFETY (NEEDLE) IMPLANT
NS IRRIG 1000ML POUR BTL (IV SOLUTION) ×5 IMPLANT
PACK CRANIOTOMY (CUSTOM PROCEDURE TRAY) ×3 IMPLANT
PAD ARMBOARD 7.5X6 YLW CONV (MISCELLANEOUS) ×9 IMPLANT
PEEK OSTEOMATCH 200-235SQ CM (Peek) ×2 IMPLANT
PIN MAYFIELD SKULL DISP (PIN) IMPLANT
PLATE 1.5 5HOLE SQUARE (Plate) ×8 IMPLANT
SCREW SELF DRILL HT 1.5/4MM (Screw) ×32 IMPLANT
SET CRAINOPLASTY (SET/KITS/TRAYS/PACK) ×3 IMPLANT
SPONGE GAUZE 4X4 12PLY STER LF (GAUZE/BANDAGES/DRESSINGS) ×2 IMPLANT
SPONGE SURGIFOAM ABS GEL 100 (HEMOSTASIS) IMPLANT
STAPLER SKIN PROX WIDE 3.9 (STAPLE) ×3 IMPLANT
SUT ETHILON 3 0 FSL (SUTURE) IMPLANT
SUT NURALON 4 0 TR CR/8 (SUTURE) ×2 IMPLANT
SUT VIC AB 2-0 CT2 18 VCP726D (SUTURE) ×9 IMPLANT
SYR 20ML ECCENTRIC (SYRINGE) ×1 IMPLANT
SYR CONTROL 10ML LL (SYRINGE) ×1 IMPLANT
TOWEL OR 17X24 6PK STRL BLUE (TOWEL DISPOSABLE) ×3 IMPLANT
TOWEL OR 17X26 10 PK STRL BLUE (TOWEL DISPOSABLE) ×3 IMPLANT
TRAY FOLEY CATH 14FRSI W/METER (CATHETERS) IMPLANT
UNDERPAD 30X30 INCONTINENT (UNDERPADS AND DIAPERS) IMPLANT
WATER STERILE IRR 1000ML POUR (IV SOLUTION) ×1 IMPLANT

## 2014-05-22 NOTE — PMR Pre-admission (Signed)
PMR Admission Coordinator Pre-Admission Assessment  Patient: Geoffrey Neal is an 69 y.o., male MRN: [id number] DOB: Nov 14, 1945 Height: 5\' 7"  (170.2 cm) Weight: 95.7 kg (210 lb 15.7 oz)              Insurance Information HMO:     PPO:      PCP:      IPA:      80/20: yes     OTHER: no HMO PRIMARY: medicare a and b      Policy#: [id number]      Subscriber: pt Benefits:  Phone #: palmetto online     Name: 05/13/14 Eff. Date: a 05/25/08 and b 02/23/12     Deduct: $1288      Out of Pocket Max: none      Life Max: none CIR: 100%      SNF: 20 full days Outpatient: 80%     Co-Pay: 20% Home Health: 100%      Co-Pay: none DME: 80%     Co-Pay: 20% Providers: pt choice  SECONDARY: Medicaid [place] Access      Policy#: [id number]      Subscriber: pt   Medicaid Application Date:       Case Manager:  Disability Application Date:       Case Worker:   Emergency Contact Information Contact Information    Name Relation Home Work Mobile   [place] Daughter [phone]       Current Medical History  Patient Admitting Diagnosis: 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 right handed non-English speaking male with history of approximately 2 months right frontal scalp mass with intermittent headaches and blurred vision. 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 Patient admitted 05/08/2013 underwent cerebral angiogram with embolization of the mass followed by bilateral frontoparietal craniectomy for tumor excision 05/08/2014 per Dr. Cyndy Freeze. Maintained on Decadron protocol. Keppra for seizure prophylaxis. Subcutaneous heparin initiated for DVT prophylaxis   Cranioplasty completed 05/22/2014.  Past Medical History  Past Medical History  Diagnosis Date  . Elevated PSA   .  Frequency of urination   . Mixed hyperlipidemia     MILD PER PT  . Nocturia   . DJD (degenerative joint disease)   . Hypothyroidism   . Diabetes mellitus without complication     ?  Marland Kitchen History of kidney stones     yrs ago  . GERD (gastroesophageal reflux disease)     occ    Family History  family history is not on file.  Prior Rehab/Hospitalizations: none   Current Medications   Current facility-administered medications:  .  acetaminophen (TYLENOL) tablet 1,000 mg, 1,000 mg, Oral, Q6H PRN **OR** acetaminophen (TYLENOL) suppository 650 mg, 650 mg, Rectal, Q6H PRN, Rob Hickman, MD .  chlorproMAZINE (THORAZINE) tablet 25 mg, 25 mg, Oral, TID PRN, Eustace Moore, MD, 25 mg at 05/17/14 3009 .  heparin injection 5,000 Units, 5,000 Units, Subcutaneous, 3 times per day, Floyce Stakes, MD, 5,000 Units at 05/25/14 0522 .  HYDROcodone-acetaminophen (NORCO/VICODIN) 5-325 MG per tablet 1 tablet, 1 tablet, Oral, Q4H PRN, Ashok Pall, MD, 1 tablet at 05/24/14 1956 .  insulin aspart (novoLOG) injection 2-6 Units, 2-6 Units, Subcutaneous, TID WC, Ashok Pall, MD, 2 Units at 05/25/14 0820 .  levETIRAcetam (KEPPRA) tablet 500 mg, 500 mg, Oral, BID,  Ashok Pall, MD, 500 mg at 05/25/14 0820 .  levothyroxine (SYNTHROID, LEVOTHROID) tablet 175 mcg, 175 mcg, Oral, QAC breakfast, Charlie Pitter, MD, 175 mcg at 05/25/14 [phone] .  morphine 2 MG/ML injection 2-6 mg, 2-6 mg, Intravenous, Q1H PRN, Ashok Pall, MD, 2 mg at 05/22/14 2038 .  naloxone [facility]) injection 0.08 mg, 0.08 mg, Intravenous, PRN, Ashok Pall, MD .  ondansetron (ZOFRAN) tablet 4 mg, 4 mg, Oral, Q4H PRN **OR** ondansetron (ZOFRAN) injection 4 mg, 4 mg, Intravenous, Q4H PRN, Ashok Pall, MD .  polyethylene glycol (MIRALAX / GLYCOLAX) packet 17 g, 17 g, Oral, Daily PRN, Ashok Pall, MD, 17 g at 05/25/14 6381 .  senna (SENOKOT) tablet 8.6 mg, 1 tablet, Oral, BID, Ashok Pall, MD, 8.6 mg at 05/25/14 0821 .  simethicone (MYLICON)  chewable tablet 80 mg, 80 mg, Oral, QID PRN, Ashok Pall, MD, 80 mg at 05/14/14 2105 .  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 Has been in the [place] for 22 years. Independent without AD. Over past few months pta pt more sleepy and tired. When tumor noted on skull by daughter and wife 02/2014, they brought him to ER for assessment . Dr Christella Noa then planned surgery..Independent with ADLS pta.Drives, Does speak basic Manufacturing engineer / [place] Devices/Equipment: None  Prior Functional Level Prior Function Level of Independence: Independent Drove. He states he reads in [place] and [place]  Current Functional Level Cognition  Arousal/Alertness: Awake/alert Overall Cognitive Status: Impaired/Different from baseline Current Attention Level: Sustained Orientation Level: Oriented X4 Following Commands: Follows one step commands with increased time Safety/Judgement: Decreased awareness of deficits, Decreased awareness of safety General Comments: most of session spoke [place]; occ responded to Spanish instructions more quickly; poor safety awareness with repeated attempts to stand on his own Attention: Focused, Sustained, Selective Focused Attention: Appears intact Sustained Attention: Impaired Sustained Attention Impairment: Verbal basic, Functional basic Selective Attention: Impaired Selective Attention Impairment: Verbal basic, Functional basic Awareness: Impaired Awareness Impairment: Emergent impairment, Anticipatory impairment, Intellectual impairment Problem Solving:  Impaired Problem Solving Impairment: Functional basic Executive Function: Sequencing, Self Correcting, Initiating, Organizing, Reasoning Reasoning: Impaired Sequencing: Impaired Organizing: Impaired Initiating: Impaired Self Correcting: Impaired Safety/Judgment: Impaired    Extremity Assessment (includes Sensation/Coordination)  Upper Extremity Assessment: Defer to OT evaluation LUE Deficits / Details: No AROM noted, did repond to pain on LUE with "ouch" but did not withdraw to pain LUE Coordination: decreased fine motor, decreased gross motor  Lower Extremity Assessment: LLE deficits/detail LLE Deficits / Details: pt PROM WFL, but no active movement noted.  Pain sensation intact with pt responding "Ouch", but no withdraw from pain.   LLE Coordination: decreased fine motor, decreased gross motor    ADLs  Overall ADL's : Needs assistance/impaired Grooming: Wash/dry face, Min guard, Sitting Grooming Details (indicate cue type and reason): Patient with increased functional use of LUE during washing face. Patient also with increased cognition, being able to follow one-step commands more consistently  Lower Body Dressing: Moderate assistance, Cueing for safety Toilet Transfer: +2 for physical assistance, Maximal assistance, Squat-pivot General ADL Comments: Patient able to bring RLE to self for LB ADLs, patient requries assistance  to manage LLE. Patient able to engage in bed mobility with min assist (1 person). Patient easily distracted and with poor attention during this treatment. No transfer performed secondary to nursing waiting to give patient sponge bath in bed.     Mobility  Overal bed mobility: Needs Assistance Bed Mobility: Rolling, Sidelying to Sit, Supine to Sit Rolling: Supervision Sidelying to sit: Min assist Supine to sit: Min assist Sit to supine: Min assist General bed mobility comments: required instructional cueing and encouragement to get LLE into bed     Transfers   Overall transfer level: Needs assistance Equipment used: None Transfers: Sit to/from Stand, Stand Pivot Transfers Sit to Stand: Mod assist Stand pivot transfers: Mod assist, +2 physical assistance Squat pivot transfers: Max assist, +2 physical assistance (to pt's right) General transfer comment: No transfer during OT treatment    Ambulation / Gait / Stairs / Wheelchair Mobility  Ambulation/Gait Ambulation/Gait assistance: Max assist, +2 physical assistance Ambulation Distance (Feet): 4 Feet Assistive device: Rolling walker (2 wheeled) Gait Pattern/deviations: Step-through pattern, Decreased step length - left, Decreased stance time - left, Decreased dorsiflexion - left, Decreased weight shift to right, Leaning posteriorly General Gait Details: PT in front with pt's hands on her shoulders with tech behind pt; pt with no active hip flexion and no attempts to advance LLE himself;     Posture / Balance Dynamic Sitting Balance Sitting balance - Comments: Patient able to maintain dynamic sitting balance during LUE exercises without assistance from therapist.  Balance Overall balance assessment: Needs assistance Sitting-balance support: Feet supported, No upper extremity supported Sitting balance-Leahy Scale: Good Sitting balance - Comments: Patient able to maintain dynamic sitting balance during LUE exercises without assistance from therapist.  Postural control: Left lateral lean, Posterior lean Standing balance support: Bilateral upper extremity supported Standing balance-Leahy Scale: Zero Standing balance comment: strong posterior bias    Special needs/care consideration Skin  Surgical site                        Bowel mgmt:continent last BM 1/24 Bladder mgmt:foley postoperatively Diabetic mgmt CBGs elevated . Daughter states not diabetic pta Primary language SPanish I have arranged [place] interpreter for first day of CIR   Previous Home Environment Living Arrangements:  Spouse/significant other, Children, Other (Comment) (Lives with his wife, daughter and 71 yo grandchildren)  Lives With: Spouse, Family Available Help at Discharge: Family, Available 24 hours/day Type of Home: House Home Layout: Two level, Able to live on main level with bedroom/bathroom, Other (Comment) (Daughter has the master on the first level with full bed and) Alternate Level Stairs-Number of Steps: 14 steps to second floor Home Access: Stairs to enter Entrance Stairs-Rails: None Entrance Stairs-Number of Steps: 2 Bathroom Shower/Tub: Tub/shower unit, Other (comment) (tub shower combination in main level and second level full b) Bathroom Toilet: Standard Bathroom Accessibility: Yes How Accessible: Accessible via walker [place]: No  Discharge Living Setting Plans for Discharge Living Setting: Patient's home, Lives with (comment), Other (Comment) (wife and daughter with her twin 66 yo children) Type of Home at Discharge: House Discharge Home Layout: Two level, Full bath on main level, Able to live on main level with bedroom/bathroom, Other (Comment) (daughter uses Restaurant manager, fast food on first level but aware that her paren) Alternate Level Stairs-Number of Steps: 14 Discharge Home Access: Stairs to enter Entrance Stairs-Number of Steps: 2 Discharge Bathroom Shower/Tub: Tub/shower unit Discharge Bathroom Toilet: Standard Discharge Bathroom Accessibility: Yes How Accessible: Accessible via walker  Does the patient have any problems obtaining your medications?: No  Social/Family/Support Systems Patient Roles: Spouse, Parent, Other (Comment) (worsks seasonal jobs as sheet) Contact Information: [name], daughter Anticipated Caregiver: wife and daughter Anticipated Caregiver's Contact Information: cell [phone]. works 8-5 weekdays but is available any time 24/7 Ability/Limitations of Caregiver: daughter works 8-5 weekdays. Wife can provide 24/7 supervision to light min  assist Caregiver Availability: 24/7 Discharge Plan Discussed with Primary Caregiver: Yes Is Caregiver In Agreement with Plan?: Yes Does Caregiver/Family have Issues with Lodging/Transportation while Pt is in Rehab?: No I have discussed with his daughter that her parents need to be moved to the downstairs master with full bath.  Goals/Additional Needs Patient/Family Goal for Rehab: Min PT and OT, supervision with SLP Expected length of stay: [facility] 21 days; pt hesitant to commit beyond one week. Daughter is aware Cultural Considerations: Hispanic in [place] for 22 years Special Service Needs: Spanish Interpreter for therrapy session. Pt speaks little [place] and understands more. Wife needs interpretor. Daughter fluent both [place] and [place]. Pt/Family Agrees to Admission and willing to participate: Yes Program Orientation Provided & Reviewed with Pt/Caregiver Including Roles  & Responsibilities: Yes  Decrease burden of Care through IP rehab admission: n/a  Possible need for SNF placement upon discharge:not anticipated  Patient Condition: This patient's medical and functional status has changed since the consult dated: 05/10/2014 in which the Rehabilitation Physician determined and documented that the patient's condition is appropriate for intensive rehabilitative care in an inpatient rehabilitation facility. See "History of Present Illness" (above) for medical update. Functional changes are: overall mod to max assist. Patient's medical and functional status update has been discussed with the Rehabilitation physician and patient remains appropriate for inpatient rehabilitation. Will admit to inpatient rehab today.  Preadmission Screen Completed By:  Cleatrice Burke, 05/25/2014 9:38 AM ______________________________________________________________________   Discussed status with Dr. Naaman Plummer on 05/25/14 at  [phone] and received telephone approval for admission today.  Admission Coordinator:  Cleatrice Burke, time 6301 Date 05/25/2014.

## 2014-05-22 NOTE — Progress Notes (Signed)
Report given to OR RN. VSS, BG checked. Patient has been NPO since MN. Family at bedside. Patient to OR.  Ave Filter, RN

## 2014-05-22 NOTE — Op Note (Signed)
05/07/2014 - 05/22/2014  1:37 PM  PATIENT:  Geoffrey Neal  69 y.o. male Here for part ll of his meningioma resection. PRE-OPERATIVE DIAGNOSIS:  skull defect, post meningioma resection  POST-OPERATIVE DIAGNOSIS:  skull defect, post meningioma resection  PROCEDURE:  Procedure(s): custom Peek CRANIOPLASTY  SURGEON: Surgeon(s): Ashok Pall, MD  ASSISTANTS:none  ANESTHESIA:   general  EBL:  Total I/O In: 700 [I.V.:700] Out: 1350 [Urine:1250; Blood:100]  BLOOD ADMINISTERED:none  CELL SAVER GIVEN:none  COUNT:per nursing  DRAINS: none   SPECIMEN:  No Specimen  DICTATION: RAHEEM KOLBE was taken to the operating room, intubated, and placed under general anesthesia without difficulty.  His head was prepped and draped in a sterile manner. I opened the scalp using scissors. I removed the sutures from the galeal layer and opened enough of the incision to fully expose the craniectomy defect. I placed the implant into the defect, and the fit was excellent. I then attached 4 , four hole plates around the edge of the implant. I attached the implant to the skull. I irrigated, then closed the scalp wound in layers. I approximated the galea, then the scalp edges. I applied a sterile dressing. He tolerated the procedure well.   PLAN OF CARE: Admit to inpatient   PATIENT DISPOSITION:  PACU - hemodynamically stable.   Delay start of Pharmacological VTE agent (>24hrs) due to surgical blood loss or risk of bleeding:  yes

## 2014-05-22 NOTE — Progress Notes (Signed)
PT Cancellation Note  Patient Details Name: Geoffrey Neal MRN: [id number] DOB: 1945-05-22   Cancelled Treatment:    Reason Treat Not Completed: Patient at procedure or test/unavailable-- to OR for cranioplasty.  PT will follow-up post-op and re-evaluate patient when cleared for increased activity.    Josselyn Harkins 05/22/2014, 12:55 PM  Pager [phone]

## 2014-05-22 NOTE — Progress Notes (Signed)
Spoke with on call MD Jones about AM dose of heparin today. Was told to hold heparin for today's surgery.

## 2014-05-22 NOTE — Progress Notes (Signed)
Pt bladder scanned showed 495 ml, in and out cath performed and received 600 ml output. Will continue to monitor.   Fredrich Romans, RN 05/22/2014 10:35 PM

## 2014-05-22 NOTE — Progress Notes (Signed)
Patient arrived from PACU, VSS, patient with c/o headache at this time, diet resumed, patient tolerating sips of clear liquids, safety precautions reviewed and maintained, will continue to monitor.

## 2014-05-22 NOTE — Anesthesia Preprocedure Evaluation (Signed)
Anesthesia Evaluation  Patient identified by MRN, date of birth, ID band Patient awake    Reviewed: Allergy & Precautions, NPO status , Patient's Chart, lab work & pertinent test results  Airway Mallampati: II   Neck ROM: full    Dental   Pulmonary former smoker,          Cardiovascular negative cardio ROS      Neuro/Psych H/o brain tumor s/p resection.    GI/Hepatic GERD-  ,  Endo/Other  diabetes, Type 2Hypothyroidism obese  Renal/GU      Musculoskeletal  (+) Arthritis -,   Abdominal   Peds  Hematology   Anesthesia Other Findings   Reproductive/Obstetrics                             Anesthesia Physical Anesthesia Plan  ASA: II  Anesthesia Plan: General   Post-op Pain Management:    Induction: Intravenous  Airway Management Planned: Oral ETT  Additional Equipment: Arterial line  Intra-op Plan:   Post-operative Plan: Extubation in OR  Informed Consent: I have reviewed the patients History and Physical, chart, labs and discussed the procedure including the risks, benefits and alternatives for the proposed anesthesia with the patient or authorized representative who has indicated his/her understanding and acceptance.     Plan Discussed with: CRNA, Anesthesiologist and Surgeon  Anesthesia Plan Comments:         Anesthesia Quick Evaluation

## 2014-05-22 NOTE — Transfer of Care (Signed)
Immediate Anesthesia Transfer of Care Note  Patient: Geoffrey Neal  Procedure(s) Performed: Procedure(s) with comments: CRANIOPLASTY (N/A) - Cranioplasty  Patient Location: PACU  Anesthesia Type:General  Level of Consciousness: awake, alert , oriented and patient cooperative  Airway & Oxygen Therapy: Patient Spontanous Breathing and Patient connected to face mask oxygen  Post-op Assessment: Report given to RN, Post -op Vital signs reviewed and stable and Patient moving all extremities X 4  Post vital signs: Reviewed and stable  Last Vitals:  Filed Vitals:   05/22/14 1010  BP: 99/68  Pulse: 57  Temp: 36.7 C  Resp:     Complications: No apparent anesthesia complications

## 2014-05-22 NOTE — Progress Notes (Signed)
OT Cancellation Note  Patient Details Name: DEANTA MINCEY MRN: [id number] DOB: 09-11-1945   Cancelled Treatment:    Reason Eval/Treat Not Completed: Patient at procedure or test/ unavailable Patient off floor for cranioplasty. Will re-evaluate when patient is cleared for therapies.   Thong Feeny , MS, OTR/L, CLT  Pager: [phone]  05/22/2014, 1:37 PM

## 2014-05-22 NOTE — Progress Notes (Signed)
Foley D/C'd per Dr. Christella Noa, patient DTV at 0100, urinal at bedside, patient encouraged to call for assistance.

## 2014-05-23 LAB — GLUCOSE, CAPILLARY
GLUCOSE-CAPILLARY: 193 mg/dL — AB (ref 70–99)
GLUCOSE-CAPILLARY: 183 mg/dL — AB (ref 70–99)
Glucose-Capillary: 177 mg/dL — ABNORMAL HIGH (ref 70–99)
Glucose-Capillary: 206 mg/dL — ABNORMAL HIGH (ref 70–99)

## 2014-05-23 NOTE — Progress Notes (Signed)
Patient ID: Geoffrey Neal, male   DOB: 04-Apr-1946, 69 y.o.   MRN: [id number] BP 130/76 mmHg  Pulse 89  Temp(Src) 98.1 F (36.7 C) (Oral)  Resp 20  Ht 5\' 7"  (1.702 m)  Wt 95.7 kg (210 lb 15.7 oz)  BMI 33.04 kg/m2  SpO2 97% Alert and oriented x 4 Wound is clean, dry, and without signs of infection Needs more time with PT for walking Once ambulating can be discharged.

## 2014-05-23 NOTE — Progress Notes (Signed)
Pt bladder scanned showed 767 ml. Foley inserted per order if residual >300.  800 ml was output per foley. Will continue to monitor.  Fredrich Romans, RN 05/23/2014 4:50 AM

## 2014-05-24 LAB — GLUCOSE, CAPILLARY
GLUCOSE-CAPILLARY: 148 mg/dL — AB (ref 70–99)
GLUCOSE-CAPILLARY: 143 mg/dL — AB (ref 70–99)
Glucose-Capillary: 124 mg/dL — ABNORMAL HIGH (ref 70–99)
Glucose-Capillary: 168 mg/dL — ABNORMAL HIGH (ref 70–99)

## 2014-05-24 NOTE — Progress Notes (Signed)
No issues overnight. English is poor, but states he hasn't walked yet. No c/o pain.  EXAM:  BP 118/73 mmHg  Pulse 77  Temp(Src) 98.2 F (36.8 C) (Oral)  Resp 20  Ht 5\' 7"  (1.702 m)  Wt 95.7 kg (210 lb 15.7 oz)  BMI 33.04 kg/m2  SpO2 96%  Awake, alert, oriented  Speech fluent, appropriate  CN grossly intact  5/5 BUE/BLE   IMPRESSION:  69 y.o. male POD#2 s/p cranioplasty after meningioma resection, neurologically stable  PLAN: - PT/OT for mobilization

## 2014-05-25 ENCOUNTER — Inpatient Hospital Stay (HOSPITAL_COMMUNITY)
Admission: AD | Admit: 2014-05-25 | Discharge: 2014-06-05 | DRG: 945 | Disposition: A | Discharge status: Home Health | Payer: Medicare Other | Source: Intra-hospital | Attending: Physical Medicine & Rehabilitation | Admitting: Physical Medicine & Rehabilitation

## 2014-05-25 ENCOUNTER — Encounter (HOSPITAL_COMMUNITY): Payer: Self-pay | Admitting: Neurosurgery

## 2014-05-25 DIAGNOSIS — Z9889 Other specified postprocedural states: Secondary | ICD-10-CM | POA: Diagnosis not present

## 2014-05-25 DIAGNOSIS — E039 Hypothyroidism, unspecified: Secondary | ICD-10-CM | POA: Diagnosis present

## 2014-05-25 DIAGNOSIS — N390 Urinary tract infection, site not specified: Secondary | ICD-10-CM | POA: Diagnosis present

## 2014-05-25 DIAGNOSIS — Z86011 Personal history of benign neoplasm of the brain: Secondary | ICD-10-CM | POA: Diagnosis not present

## 2014-05-25 DIAGNOSIS — Z5189 Encounter for other specified aftercare: Principal | ICD-10-CM

## 2014-05-25 DIAGNOSIS — G839 Paralytic syndrome, unspecified: Secondary | ICD-10-CM | POA: Diagnosis not present

## 2014-05-25 DIAGNOSIS — R1310 Dysphagia, unspecified: Secondary | ICD-10-CM | POA: Diagnosis present

## 2014-05-25 DIAGNOSIS — D329 Benign neoplasm of meninges, unspecified: Secondary | ICD-10-CM

## 2014-05-25 DIAGNOSIS — R339 Retention of urine, unspecified: Secondary | ICD-10-CM | POA: Diagnosis not present

## 2014-05-25 DIAGNOSIS — R531 Weakness: Secondary | ICD-10-CM | POA: Diagnosis present

## 2014-05-25 DIAGNOSIS — R269 Unspecified abnormalities of gait and mobility: Secondary | ICD-10-CM

## 2014-05-25 DIAGNOSIS — B961 Klebsiella pneumoniae [K. pneumoniae] as the cause of diseases classified elsewhere: Secondary | ICD-10-CM | POA: Diagnosis present

## 2014-05-25 DIAGNOSIS — R52 Pain, unspecified: Secondary | ICD-10-CM | POA: Diagnosis present

## 2014-05-25 DIAGNOSIS — G8220 Paraplegia, unspecified: Secondary | ICD-10-CM | POA: Diagnosis present

## 2014-05-25 LAB — CBC
HCT: 35.2 % — ABNORMAL LOW (ref 39.0–52.0)
Hemoglobin: 11.9 g/dL — ABNORMAL LOW (ref 13.0–17.0)
MCH: 30 pg (ref 26.0–34.0)
MCHC: 33.8 g/dL (ref 30.0–36.0)
MCV: 88.7 fL (ref 78.0–100.0)
Platelets: 202 10*3/uL (ref 150–400)
RBC: 3.97 MIL/uL — AB (ref 4.22–5.81)
RDW: 13 % (ref 11.5–15.5)
WBC: 7.6 10*3/uL (ref 4.0–10.5)

## 2014-05-25 LAB — CREATININE, SERUM
Creatinine, Ser: 0.92 mg/dL (ref 0.50–1.35)
GFR calc non Af Amer: 83 mL/min — ABNORMAL LOW (ref >90)

## 2014-05-25 LAB — GLUCOSE, CAPILLARY
GLUCOSE-CAPILLARY: 148 mg/dL — AB (ref 70–99)
Glucose-Capillary: 119 mg/dL — ABNORMAL HIGH (ref 70–99)

## 2014-05-25 MED ORDER — LEVETIRACETAM 500 MG PO TABS
500.0000 mg | ORAL_TABLET | Freq: Two times a day (BID) | ORAL | Status: DC
  Administered 2014-05-25 – 2014-06-05 (×22): 500 mg via ORAL
  Filled 2014-05-25 (×25): qty 1

## 2014-05-25 MED ORDER — HEPARIN SODIUM (PORCINE) 5000 UNIT/ML IJ SOLN
5000.0000 [IU] | Freq: Three times a day (TID) | INTRAMUSCULAR | Status: DC
  Administered 2014-05-25 – 2014-06-05 (×33): 5000 [IU] via SUBCUTANEOUS
  Filled 2014-05-25 (×33): qty 1

## 2014-05-25 MED ORDER — ACETAMINOPHEN 325 MG PO TABS
325.0000 mg | ORAL_TABLET | ORAL | Status: DC | PRN
Start: 2014-05-25 — End: 2014-06-05
  Administered 2014-05-28: 650 mg via ORAL
  Filled 2014-05-25 (×2): qty 2

## 2014-05-25 MED ORDER — LEVOTHYROXINE SODIUM 175 MCG PO TABS
175.0000 ug | ORAL_TABLET | Freq: Every day | ORAL | Status: DC
  Administered 2014-05-26 – 2014-06-05 (×11): 175 ug via ORAL
  Filled 2014-05-25 (×12): qty 1

## 2014-05-25 MED ORDER — ONDANSETRON HCL 4 MG PO TABS: 4.0000 mg | ORAL_TABLET | Freq: Four times a day (QID) | ORAL | Status: DC | PRN

## 2014-05-25 MED ORDER — ONDANSETRON HCL 4 MG/2ML IJ SOLN: 4.0000 mg | Freq: Four times a day (QID) | INTRAMUSCULAR | Status: DC | PRN

## 2014-05-25 MED ORDER — SIMETHICONE 80 MG PO CHEW
80.0000 mg | CHEWABLE_TABLET | Freq: Four times a day (QID) | ORAL | Status: DC | PRN
  Administered 2014-05-29 – 2014-05-31 (×2): 80 mg via ORAL
  Filled 2014-05-25 (×3): qty 1

## 2014-05-25 MED ORDER — CHLORPROMAZINE HCL 25 MG PO TABS
25.0000 mg | ORAL_TABLET | Freq: Three times a day (TID) | ORAL | Status: DC | PRN
  Filled 2014-05-25: qty 1

## 2014-05-25 MED ORDER — HEPARIN SODIUM (PORCINE) 5000 UNIT/ML IJ SOLN
5000.0000 [IU] | Freq: Three times a day (TID) | INTRAMUSCULAR | Status: DC
  Filled 2014-05-25 (×5): qty 1

## 2014-05-25 MED ORDER — SENNA 8.6 MG PO TABS
1.0000 | ORAL_TABLET | Freq: Two times a day (BID) | ORAL | Status: DC
  Administered 2014-05-25 – 2014-06-01 (×14): 8.6 mg via ORAL
  Filled 2014-05-25 (×17): qty 1

## 2014-05-25 MED ORDER — SORBITOL 70 % SOLN
30.0000 mL | Freq: Every day | Status: DC | PRN
  Administered 2014-05-25 – 2014-05-28 (×3): 30 mL via ORAL
  Filled 2014-05-25 (×4): qty 30

## 2014-05-25 MED ORDER — POLYETHYLENE GLYCOL 3350 17 G PO PACK
17.0000 g | PACK | Freq: Every day | ORAL | Status: DC | PRN
  Filled 2014-05-25: qty 1

## 2014-05-25 MED ORDER — HYDROCODONE-ACETAMINOPHEN 5-325 MG PO TABS
1.0000 | ORAL_TABLET | ORAL | Status: DC | PRN
  Administered 2014-05-25 – 2014-06-04 (×16): 1 via ORAL
  Filled 2014-05-25 (×16): qty 1

## 2014-05-25 NOTE — Progress Notes (Signed)
I met with pt and his wife with [employer] interpreting. He is in agreement to admission to inpt rehab today. I will make the arrangements. I contacted Dr. Christella Noa and pt's daughter by phone. [phone]

## 2014-05-25 NOTE — Discharge Summary (Signed)
  Physician Discharge Summary  Patient ID: Geoffrey Neal MRN: [id number] DOB/AGE: 71-Jul-1947 71 y.o.  Admit date: 05/07/2014 Discharge date: 05/25/2014  Admission Diagnoses:Meningioma Discharge Diagnoses: Meningioma, cranial defect Active Problems:   Brain tumor   Meningioma   Discharged Condition: good  Hospital Course: Geoffrey Neal was admitted and taken to the operating room for an uncomplicated resection of this intra and extracranial mass. He was left with a large cranial defect, which was repaired with a custom implant. At this time he is eligible for discharge, doing well, and in need of more intensive rehabilitation. His wound is clean, dry, and without signs of infection. Staples should be removed next week.   Treatments: surgery:  Stage I(Second planned stage is skull reconstruction) BILATERAL FRONTAL PARIETAL craniectomy for TUMOR EXCISION custom Peek CRANIOPLASTY   Discharge Exam: Blood pressure 100/73, pulse 78, temperature 98.6 F (37 C), temperature source Oral, resp. rate 20, height 5\' 7"  (1.702 m), weight 95.7 kg (210 lb 15.7 oz), SpO2 98 %. General appearance: alert, cooperative and appears stated age Neurologic: Alert and oriented X 3, normal strength and tone. Normal symmetric reflexes. Normal coordination and gait  Disposition: 01-Home or Self Care skull defect    Medication List    TAKE these medications        levothyroxine 175 MCG tablet  Commonly known as:  SYNTHROID, LEVOTHROID  Take 175 mcg by mouth daily before breakfast.         Signed: Zoila Ditullio Neal 05/25/2014, 1:19 PM

## 2014-05-25 NOTE — Progress Notes (Signed)
Geoffrey Burke, RN Rehab Admission Coordinator Signed Physical Medicine and Rehabilitation PMR Pre-admission 05/22/2014 1:20 PM  Related encounter: Admission (Current) from 05/07/2014 in [place] Collapse All   PMR Admission Coordinator Pre-Admission Assessment  Patient: Geoffrey Neal is an 69 y.o., male MRN: [id number] DOB: 12-23-1945 Height: 5\' 7"  (170.2 cm) Weight: 95.7 kg (210 lb 15.7 oz)  Insurance Information HMO: PPO: PCP: IPA: 80/20: yes OTHER: no HMO PRIMARY: medicare a and b Policy#: [id number] a Subscriber: pt Benefits: Phone #: palmetto online Name: 05/13/14 Eff. 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 right handed non-English speaking male with history of approximately 2 months right frontal scalp mass with intermittent headaches and blurred vision. 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  Patient admitted 05/08/2013 underwent cerebral angiogram with embolization of the mass followed by bilateral frontoparietal craniectomy for tumor excision 05/08/2014 per Dr. Cyndy Freeze. Maintained on Decadron protocol. Keppra for seizure prophylaxis. Subcutaneous heparin initiated for DVT prophylaxis   Cranioplasty completed 05/22/2014.  Past Medical History  Past Medical History  Diagnosis Date  . Elevated PSA   . Frequency of urination   . Mixed hyperlipidemia     MILD PER PT  . Nocturia   . DJD (degenerative joint disease)   . Hypothyroidism   . Diabetes mellitus without complication     ?  Marland Kitchen History of kidney stones     yrs ago  . GERD (gastroesophageal reflux disease)     occ    Family History  family history is not on file.  Prior Rehab/Hospitalizations: none  Current Medications   Current facility-administered medications:  . acetaminophen (TYLENOL) tablet 1,000 mg, 1,000 mg, Oral, Q6H PRN **OR** acetaminophen (TYLENOL) suppository 650 mg, 650 mg, Rectal, Q6H PRN, Rob Hickman, MD . chlorproMAZINE (THORAZINE) tablet 25 mg, 25 mg, Oral, TID PRN, Eustace Moore, MD, 25 mg at 05/17/14 6754 . heparin injection 5,000 Units, 5,000 Units, Subcutaneous, 3 times per day, Floyce Stakes, MD, 5,000 Units at 05/25/14 0522 . HYDROcodone-acetaminophen (NORCO/VICODIN) 5-325 MG per tablet 1 tablet, 1 tablet, Oral, Q4H PRN, Ashok Pall, MD, 1 tablet at 05/24/14 1956 . insulin aspart (novoLOG) injection 2-6 Units, 2-6 Units, Subcutaneous, TID WC, Ashok Pall, MD, 2 Units at 05/25/14 0820 . levETIRAcetam (KEPPRA) tablet 500 mg, 500 mg, Oral, BID, Ashok Pall, MD, 500 mg at 05/25/14 0820 . levothyroxine (SYNTHROID, LEVOTHROID) tablet 175 mcg, 175 mcg, Oral, QAC breakfast, Charlie Pitter, MD, 175 mcg at 05/25/14 [phone] . morphine 2 MG/ML injection 2-6 mg, 2-6 mg, Intravenous, Q1H PRN, Ashok Pall, MD, 2 mg at 05/22/14 2038 .  naloxone [facility]) injection 0.08 mg, 0.08 mg, Intravenous, PRN, Ashok Pall, MD . ondansetron (ZOFRAN) tablet 4 mg, 4  mg, Oral, Q4H PRN **OR** ondansetron (ZOFRAN) injection 4 mg, 4 mg, Intravenous, Q4H PRN, Ashok Pall, MD . polyethylene glycol (MIRALAX / GLYCOLAX) packet 17 g, 17 g, Oral, Daily PRN, Ashok Pall, MD, 17 g at 05/25/14 1761 . senna (SENOKOT) tablet 8.6 mg, 1 tablet, Oral, BID, Ashok Pall, MD, 8.6 mg at 05/25/14 0821 . simethicone (MYLICON) chewable tablet 80 mg, 80 mg, Oral, QID PRN, Ashok Pall, MD, 80 mg at 05/14/14 2105 . 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 Has been in the [place] for 22 years. Independent without AD. Over past few months pta pt more sleepy and tired. When tumor noted on skull by daughter and wife 02/2014, they brought him to ER for assessment . Dr Christella Noa then planned surgery..Independent with ADLS pta.Drives, Does speak basic Manufacturing engineer / [employer] Devices/Equipment: None  Prior Functional Level Prior Function Level of Independence: Independent Drove. He states he reads in [place] and [place]  Current Functional Level Cognition  Arousal/Alertness: Awake/alert Overall Cognitive Status: Impaired/Different from baseline Current Attention Level: Sustained Orientation Level: Oriented X4 Following Commands: Follows one step commands with increased time Safety/Judgement: Decreased awareness of deficits, Decreased awareness of safety General Comments: most of session spoke [place]; occ responded to Spanish instructions more  quickly; poor safety awareness with repeated attempts to stand on his own Attention: Focused, Sustained, Selective Focused Attention: Appears intact Sustained Attention: Impaired Sustained Attention Impairment: Verbal basic, Functional basic Selective Attention: Impaired Selective Attention Impairment: Verbal basic, Functional basic Awareness: Impaired Awareness Impairment: Emergent impairment, Anticipatory impairment, Intellectual impairment Problem Solving: Impaired Problem Solving Impairment: Functional basic Executive Function: Sequencing, Self Correcting, Initiating, Organizing, Reasoning Reasoning: Impaired Sequencing: Impaired Organizing: Impaired Initiating: Impaired Self Correcting: Impaired Safety/Judgment: Impaired   Extremity Assessment (includes Sensation/Coordination)  Upper Extremity Assessment: Defer to OT evaluation LUE Deficits / Details: No AROM noted, did repond to pain on LUE with "ouch" but did not withdraw to pain LUE Coordination: decreased fine motor, decreased gross motor  Lower Extremity Assessment: LLE deficits/detail LLE Deficits / Details: pt PROM WFL, but no active movement noted. Pain sensation intact with pt responding "Ouch", but no withdraw from pain.  LLE Coordination: decreased fine motor, decreased gross motor    ADLs  Overall ADL's : Needs assistance/impaired Grooming: Wash/dry face, Min guard, Sitting Grooming Details (indicate cue type and reason): Patient with increased functional use of LUE during washing face. Patient also with increased cognition, being able to follow one-step commands more consistently  Lower Body Dressing: Moderate assistance, Cueing for safety Toilet Transfer: +2 for physical assistance, Maximal assistance, Squat-pivot General ADL Comments: Patient able to bring RLE to self for LB ADLs, patient requries assistance to manage LLE. Patient able to engage in bed mobility with min assist (1 person). Patient  easily distracted and with poor attention during this treatment. No transfer performed secondary to nursing waiting to give patient sponge bath in bed.     Mobility  Overal bed mobility: Needs Assistance Bed Mobility: Rolling, Sidelying to Sit, Supine to Sit Rolling: Supervision Sidelying to sit: Min assist Supine to sit: Min assist Sit to supine: Min assist General bed mobility comments:  required instructional cueing and encouragement to get LLE into bed     Transfers  Overall transfer level: Needs assistance Equipment used: None Transfers: Sit to/from Stand, Stand Pivot Transfers Sit to Stand: Mod assist Stand pivot transfers: Mod assist, +2 physical assistance Squat pivot transfers: Max assist, +2 physical assistance (to pt's right) General transfer comment: No transfer during OT treatment    Ambulation / Gait / Stairs / Wheelchair Mobility  Ambulation/Gait Ambulation/Gait assistance: Max assist, +2 physical assistance Ambulation Distance (Feet): 4 Feet Assistive device: Rolling walker (2 wheeled) Gait Pattern/deviations: Step-through pattern, Decreased step length - left, Decreased stance time - left, Decreased dorsiflexion - left, Decreased weight shift to right, Leaning posteriorly General Gait Details: PT in front with pt's hands on her shoulders with tech behind pt; pt with no active hip flexion and no attempts to advance LLE himself;     Posture / Balance Dynamic Sitting Balance Sitting balance - Comments: Patient able to maintain dynamic sitting balance during LUE exercises without assistance from therapist.  Balance Overall balance assessment: Needs assistance Sitting-balance support: Feet supported, No upper extremity supported Sitting balance-Leahy Scale: Good Sitting balance - Comments: Patient able to maintain dynamic sitting balance during LUE exercises without assistance from therapist.  Postural control: Left lateral lean, Posterior lean Standing  balance support: Bilateral upper extremity supported Standing balance-Leahy Scale: Zero Standing balance comment: strong posterior bias    Special needs/care consideration Skin Surgical site  Bowel mgmt:continent last BM 1/24 Bladder mgmt:foley postoperatively Diabetic mgmt CBGs elevated . Daughter states not diabetic pta Primary language SPanish I have arranged [place] interpreter for first day of CIR   Previous Home Environment Living Arrangements: Spouse/significant other, Children, Other (Comment) (Lives with his wife, daughter and 73 yo grandchildren) Lives With: Spouse, Family Available Help at Discharge: Family, Available 24 hours/day Type of Home: House Home Layout: Two level, Able to live on main level with bedroom/bathroom, Other (Comment) (Daughter has the master on the first level with full bed and) Alternate Level Stairs-Number of Steps: 14 steps to second floor Home Access: Stairs to enter Entrance Stairs-Rails: None Entrance Stairs-Number of Steps: 2 Bathroom Shower/Tub: Tub/shower unit, Other (comment) (tub shower combination in main level and second level full b) Bathroom Toilet: Standard Bathroom Accessibility: Yes How Accessible: Accessible via walker [place]: No  Discharge Living Setting Plans for Discharge Living Setting: Patient's home, Lives with (comment), Other (Comment) (wife and daughter with her twin 32 yo children) Type of Home at Discharge: House Discharge Home Layout: Two level, Full bath on main level, Able to live on main level with bedroom/bathroom, Other (Comment) (daughter uses Restaurant manager, fast food on first level but aware that her paren) Alternate Level Stairs-Number of Steps: 14 Discharge Home Access: Stairs to enter Entrance Stairs-Number of Steps: 2 Discharge Bathroom Shower/Tub: Tub/shower unit Discharge Bathroom Toilet: Standard Discharge Bathroom Accessibility: Yes How Accessible: Accessible via walker Does the  patient have any problems obtaining your medications?: No  Social/Family/Support Systems Patient Roles: Spouse, Parent, Other (Comment) (worsks seasonal jobs as sheet) Contact Information: [name], daughter Anticipated Caregiver: wife and daughter Anticipated Caregiver's Contact Information: cell [phone]. works 8-5 weekdays but is available any time 24/7 Ability/Limitations of Caregiver: daughter works 8-5 weekdays. Wife can provide 24/7 supervision to light min assist Caregiver Availability: 24/7 Discharge Plan Discussed with Primary Caregiver: Yes Is Caregiver In Agreement with Plan?: Yes Does Caregiver/Family have Issues with Lodging/Transportation while Pt is in Rehab?: No I have discussed with his daughter that her parents need  to be moved to the Chemical engineer with full bath.  Goals/Additional Needs Patient/Family Goal for Rehab: Min PT and OT, supervision with SLP Expected length of stay: [facility] 21 days; pt hesitant to commit beyond one week. Daughter is aware Cultural Considerations: Hispanic in [place] for 22 years Special Service Needs: Spanish Interpreter for therrapy session. Pt speaks little [place] and understands more. Wife needs interpretor. Daughter fluent both [place] and [place]. Pt/Family Agrees to Admission and willing to participate: Yes Program Orientation Provided & Reviewed with Pt/Caregiver Including Roles & Responsibilities: Yes  Decrease burden of Care through IP rehab admission: n/a  Possible need for SNF placement upon discharge:not anticipated  Patient Condition: This patient's medical and functional status has changed since the consult dated: 05/10/2014 in which the Rehabilitation Physician determined and documented that the patient's condition is appropriate for intensive rehabilitative care in an inpatient rehabilitation facility. See "History of Present Illness" (above) for medical update. Functional changes are: overall mod to max assist. Patient's  medical and functional status update has been discussed with the Rehabilitation physician and patient remains appropriate for inpatient rehabilitation. Will admit to inpatient rehab today.  Preadmission Screen Completed By: Geoffrey Neal, 05/25/2014 9:38 AM ______________________________________________________________________  Discussed status with Dr. Naaman Plummer on 05/25/14 at [phone] and received telephone approval for admission today.  Admission Coordinator: Geoffrey Neal, time 3716 Date 05/25/2014.          Cosigned by: Meredith Staggers, MD at 05/25/2014 9:52 AM  Revision History     Date/Time User Provider Type Action   05/25/2014 9:52 AM Meredith Staggers, MD Physician Cosign   05/25/2014 9:46 AM Geoffrey Burke, RN Rehab Admission Coordinator Sign

## 2014-05-25 NOTE — Progress Notes (Signed)
Report given to [facility] on 4W. Patient is transfer to [phone]. All belongings sent with patient. Daughter, Jamelle Haring, called and notified.   Ave Filter, RN

## 2014-05-25 NOTE — Discharge Instructions (Signed)
Craniotomy °Care After °Please read the instructions outlined below and refer to this sheet in the next few weeks. These discharge instructions provide you with general information on caring for yourself after you leave the hospital. Your surgeon may also give you specific instructions. While your treatment has been planned according to the most current medical practices available, unavoidable complications occasionally occur. If you have any problems or questions after discharge, please call your surgeon. °Although there are many types of brain surgery, recovery following craniotomy (surgical opening of the skull) is much the same for each. However, recovery depends on many factors. These include the type and severity of brain injury and the type of surgery. It also depends on any nervous system function problems (neurological deficits) before surgery. If the craniotomy was done for cancer, chemotherapy and radiation could follow. You could be in the hospital from 5 days to a couple weeks. This depends on the type of surgery, findings, and whether there are complications. °HOME CARE INSTRUCTIONS  °· It is not unusual to hear a clicking noise after a craniotomy, the plates and screws used to attach the bone flap can sometimes cause this. It is a normal occurrence if this does happen °· Do not drive for 10 days after the operation °· Your scalp may feel spongy for a while, because of fluid under it. This will gradually get better. Occasionally, the surgeon will not replace the bone that was removed to access the brain. If there is a bony defect, the surgeon will ask you to wear a helmet for protection. This is a discussion you should have with your surgeon prior to leaving the hospital (discharge). °· Numbness may persist in some areas of your scalp. °· Take all medications as directed. Sometimes steroids to control swelling are prescribed. Anticonvulsants to prevent seizures may also be given. Do not use alcohol,  other drugs, or medications unless your surgeon says it is OK. °· Keep the wound dry and clean. The wound may be washed gently with soap and water. Then, you may gently blot or dab it dry, without rubbing. Do not take baths, use swimming pools or hot tubs for 10 days, or as instructed by your caregiver. It is best to wait to see you surgeon at your first postoperative visit, and to get directions at that time. °· Only take over-the-counter or prescription medicines for pain, discomfort, or fever as directed by your caregiver. °· You may continue your normal diet, as directed. °· Walking is OK for exercise. Wait at least 3 months before you return to mild, non-contact sports or as your surgeon suggests. Contact sports should be avoided for at least 1 year, unless your surgeon says it is OK. °· If you are prescribed steroids, take them exactly as prescribed. If you start having a decrease in nervous system functions (neurological deficits) and headaches as the dose of steroids is reduced, tell your surgeon right away. °· When the anticonvulsant prescription is finished you no longer need to take it. °SEEK IMMEDIATE MEDICAL CARE IF:  °· You develop nausea, vomiting, severe headaches, confusion, or you have a seizure. °· You develop chest pain, a stiff neck, or difficulty breathing. °· There is redness, swelling, or increasing pain in the wound or pin insertion sites. °· You have an increase in swelling or bruising around the eyes. °· There is drainage or pus coming from the wound. °· You have an oral temperature above 102° F (38.9° C), not controlled by medicine. °·   You notice a foul smell coming from the wound or dressing. °· The wound breaks open (edges not staying together) after the stitches have been removed. °· You develop dizziness or fainting while standing. °· You develop a rash. °· You develop any reaction or side effects to the medications given. °Document Released: 07/11/2005 Document Revised: 07/03/2011  Document Reviewed: 04/19/2009 °Exit[employer]® Patient Information ©2013 [employer]. ° °

## 2014-05-25 NOTE — Progress Notes (Signed)
Patient ID: Geoffrey Neal, male   DOB: [birth year], 69 y.o.   MRN: [id number] Patient admitted to 4W20 via bed, escorted by nursing staff and spouse.  Patient appears to be in no immediate distress at this time.  Patient speaks little english, wife speaks no english.  Foley intact, will remove per MD order.  Patient c/o pain in head 6/10 but says he is "ok" and doesn't want any medication at this time.  Will continue to monitor.  Brita Romp, RN

## 2014-05-25 NOTE — Progress Notes (Signed)
Charlett Blake, MD Physician Signed Physical Medicine and Rehabilitation Consult Note 05/11/2014 5:58 AM  Related encounter: Admission (Current) from 05/07/2014 in [place] Collapse All        Physical Medicine and Rehabilitation Consult Reason for Consult: Meningioma Referring Physician: Dr. Estanislado Pandy   HPI: Geoffrey Neal is a 69 y.o. right handed non-English speaking male with history of approximately 2 months right frontal scalp mass with intermittent headaches and blurred vision. 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 Patient admitted 05/08/2013 underwent cerebral angiogram with embolization of the mass followed by bilateral frontoparietal craniectomy for tumor excision 05/08/2014 per Dr. Cyndy Freeze. Maintained on Decadron protocol. Keppra for seizure prophylaxis. Subcutaneous heparin initiated for DVT prophylaxis 05/10/2014. Patient tolerating a dysphagia 2 thin liquid diet. Occupational therapy evaluation completed 05/13/2015 with recommendations of physical medicine and rehabilitation consult.  In bed, wife at bedside   Review of Systems  Unable to perform ROS: language   Past Medical History  Diagnosis Date  . Elevated PSA   . Frequency of urination   . Mixed hyperlipidemia     MILD PER PT  . Nocturia   . DJD (degenerative joint disease)   . Hypothyroidism   . Diabetes mellitus without complication     ?  Marland Kitchen History of kidney stones     yrs ago  . GERD (gastroesophageal reflux disease)     occ   Past Surgical History  Procedure Laterality Date  . Prostate biopsy N/A 07/19/2012    Procedure: EUA, PROSTATE BIOPSY TRANSRECTAL ULTRASONIC PROSTATE (TUBP); Surgeon: Fredricka Bonine, MD; Location:  [facility]; Service: Urology; Laterality: N/A;  . Tonsillectomy     History reviewed. No pertinent family history. Social History:  reports that he quit smoking about 33 years ago. His smoking use included Cigarettes. He quit after 15 years of use. He has never used smokeless tobacco. He reports that he does not drink alcohol or use illicit drugs. Allergies: No Known Allergies Medications Prior to Admission  Medication Sig Dispense Refill  . levothyroxine (SYNTHROID, LEVOTHROID) 175 MCG tablet Take 175 mcg by mouth daily before breakfast.       Home: Home Living Family/patient expects to be discharged to:: Private residence Living Arrangements: Spouse/significant other  Functional History:   Functional Status:  Mobility:          ADL:    Cognition: Cognition Orientation Level: Oriented to person    Blood pressure 130/70, pulse 83, temperature 98.5 F (36.9 C), temperature source Axillary, resp. rate 10, height 5\' 7"  (1.702 m), weight 95.7 kg (210 lb 15.7 oz), SpO2 94 %. Physical Exam  Constitutional:  Dressing in place to craniectomy site  Eyes:  Pupils sluggish to light  Neck: Normal range of motion. Neck supple. No thyromegaly present.  Cardiovascular: Normal rate and regular rhythm.  Respiratory: Effort normal.  Poor inspiratory effort clear to auscultation  GI: Soft. Bowel sounds are normal. He exhibits no distension.  Neurological:  Lethargic but arousable. Exam limited by language barrier. He did attempt some simple words. Inconsistent slow to initiate an limited to motor verbal commands  4/5 In RUE, 0/5 LUE 2- RLE, 0/5 LLE Sensation intact to pinch in all four ext   Lab Results Last 24 Hours    No results found for this or any  previous visit (from the past 24 hour(s)).    Imaging Results (Last 48 hours)    Ct Head W Wo Contrast  05/09/2014 CLINICAL DATA: 69 year old male status post resection of large bifrontal  meningioma tumor and the Infiltrated calvarium. Postoperatively was not moving lower extremities or left upper extremity. Today moving both upper extremities an the right lower extremity, with some toe movement. Initial encounter. EXAM: CT HEAD WITHOUT AND WITH CONTRAST TECHNIQUE: Contiguous axial images were obtained from the base of the skull through the vertex without and with intravenous contrast CONTRAST: 66mL OMNIPAQUE IOHEXOL 300 MG/ML SOLN COMPARISON: Postoperative head CT 05/08/2014. FINDINGS: Sequelae of vertex craniectomy and repair with percutaneous postoperative drain in place. Scalp hematoma and gas stable to mildly regressed. No new osseous abnormality. Visualized paranasal sinuses and mastoids are clear. Visualized orbit soft tissues are within normal limits. No ventriculomegaly. Basilar cisterns are patent. There is some streak artifact at the vertex related to the mesh repair of the dura. Underlying resection changes along the surface of the brain. Small volume pneumocephalus and extra-axial fluid or blood. Mild underlying parenchymal hypodensity, favor postoperative in nature. Small volume anterior parafalcine subdural. No new intracranial mass effect. No new intracranial hemorrhage. No evidence of cortically based acute infarction identified. Following contrast, no abnormal intracranial enhancement identified. Major intracranial vascular structures are enhancing. IMPRESSION: 1. Stable/expected postoperative appearance of the brain status post craniectomy and resection of large bifrontal tumor which had infiltrated the calvarium. 2. Following contrast no residual tumor identified. 3. No new intracranial abnormality identified. Electronically Signed By: Lars Pinks M.D. 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 Does the need for close, 24 hr/day medical supervision in concert with the patient's rehab needs make  it unreasonable for this patient to be served in a less intensive setting? Yes 2. Co-Morbidities requiring supervision/potential complications: brain tumor, cognitive def, neurogenic bowel and bladder 3. Due to bladder management, bowel management, safety, skin/wound care, disease management, medication administration, pain management and patient education, does the patient require 24 hr/day rehab nursing? Yes 4. Does the patient require coordinated care of a physician, rehab nurse, PT (1-2 hrs/day, 5 days/week), OT (1-2 hrs/day, 5 days/week) and SLP (.5-1 hrs/day, 5 days/week) to address physical and functional deficits in the context of the above medical diagnosis(es)? Yes Addressing deficits in the following areas: balance, endurance, locomotion, strength, transferring, bowel/bladder control, bathing, dressing, feeding, grooming, toileting, cognition, speech, language and swallowing 5. Can the patient actively participate in an intensive therapy program of at least 3 hrs of therapy per day at least 5 days per week? No 6. The potential for patient to make measurable gains while on inpatient rehab is NA 7. Anticipated functional outcomes upon discharge from inpatient rehab are min assist with PT, min assist with OT, min assist with SLP. 8. Estimated rehab length of stay to reach the above functional goals is: 20-25d 9. Does the patient have adequate social supports and living environment to accommodate these discharge functional goals? Potentially 10. Anticipated D/C setting: Home 11. Anticipated post D/C treatments: [place] therapy 12. Overall Rehab/Functional Prognosis: fair  RECOMMENDATIONS: This patient's condition is appropriate for continued rehabilitative care in the following setting: CIR once drain out and tolerating therapy out of bed Patient has agreed to participate in recommended program. N/A Note that insurance prior authorization may be required for reimbursement for recommended  care.  Comment: Rec Up in chair 3 hours per day    05/11/2014  Revision History     Date/Time User Provider Type Action   05/13/2014 11:14 AM Charlett Blake, MD Physician Sign   05/12/2014 3:04 PM Cathlyn Parsons, PA-C Physician Assistant [place]   View Details Report       Routing History     Date/Time From To Method   05/13/2014 11:14 AM Charlett Blake, MD Charlett Blake, MD In Basket   05/13/2014 11:14 AM Charlett Blake, MD No Pcp Per Patient In Basket

## 2014-05-25 NOTE — H&P (Signed)
Expand All Collapse All      Physical Medicine and Rehabilitation Admission H&P   Chief complaint: Weakness   HPI: Geoffrey Neal is a 69 y.o. right handed limited-English speaking male with history of approximately 2 months right frontal scalp mass with intermittent headaches and blurred vision. 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 Patient admitted 05/08/2013 underwent cerebral angiogram with embolization of the mass followed by bilateral frontoparietal craniectomy for tumor excision 05/08/2014 per Dr. Cyndy Freeze. Maintained on Decadron protocol. Keppra for seizure prophylaxis. Patient later underwent cranioplasty for skull defect post meningioma resection 05/22/2014. Subcutaneous heparin initiated for DVT prophylaxis 05/10/2014. Patient tolerating a dysphagia 2 thin liquid diet. Physical and Occupational therapy evaluation completed ongoing with recommendations of physical medicine and rehabilitation consult  Review of Systems  Eyes: Positive for blurred vision.  Gastrointestinal:   GERD  Genitourinary: Positive for frequency.  Neurological: Positive for dizziness and headaches.  All other systems reviewed and are negative.   Past Medical History  Diagnosis Date  . Elevated PSA   . Frequency of urination   . Mixed hyperlipidemia     MILD PER PT  . Nocturia   . DJD (degenerative joint disease)   . Hypothyroidism   . Diabetes mellitus without complication     ?  Marland Kitchen History of kidney stones     yrs ago  . GERD (gastroesophageal reflux disease)     occ   Past Surgical History  Procedure Laterality Date  . Prostate biopsy N/A 07/19/2012    Procedure: EUA, PROSTATE BIOPSY TRANSRECTAL ULTRASONIC PROSTATE (TUBP); Surgeon: Fredricka Bonine, MD; Location:  [facility]; Service: Urology; Laterality: N/A;  . Tonsillectomy    . Radiology with anesthesia N/A 05/07/2014    Procedure: RADIOLOGY WITH ANESTHESIA/EMBOLIZATION; Surgeon: Rob Hickman, MD; Location: [place]; Service: Radiology; Laterality: N/A;  . Craniotomy Bilateral 05/08/2014    Procedure: BILATERAL FRONTAL PARIETAL CRANIOTOMY TUMOR EXCISION; Surgeon: Ashok Pall, MD; Location: [place] [facility]; Service: Neurosurgery; Laterality: Bilateral;   History reviewed. No pertinent family history. Social History:  reports that he quit smoking about 33 years ago. His smoking use included Cigarettes. He quit after 15 years of use. He has never used smokeless tobacco. He reports that he does not drink alcohol or use illicit drugs. Allergies: No Known Allergies Medications Prior to Admission  Medication Sig Dispense Refill  . levothyroxine (SYNTHROID, LEVOTHROID) 175 MCG tablet Take 175 mcg by mouth daily before breakfast.       Home: Home Living Family/patient expects to be discharged to:: Private residence Living Arrangements: Spouse/significant other, Children, Other (Comment) (Lives with his wife, daughter and 21 yo grandchildren) Available Help at Discharge: Family, Available 24 hours/day Type of Home: House Home Access: Stairs to enter Technical brewer of Steps: 2 Entrance Stairs-Rails: None Home Layout: Two level, Able to live on main level with bedroom/bathroom, Other (Comment) (Daughter has the Restaurant manager, fast food on the first level with full bed and) Alternate Level Stairs-Number of Steps: 14 steps to second floor Lives With: Spouse, Family  Functional History: Prior Function Level of Independence: Independent  Functional Status:  Mobility: Bed Mobility Overal bed mobility: Needs Assistance Bed Mobility: Rolling, Sidelying to Sit, Supine to Sit Rolling: Supervision Sidelying to sit: Min assist Supine to sit: Min assist Sit  to supine: Min assist General bed mobility comments: required instructional cueing and encouragement to get  LLE into bed  Transfers Overall transfer level: Needs assistance Equipment used: None Transfers: Sit to/from Stand, Stand Pivot Transfers Sit to Stand: Mod assist Stand pivot transfers: Mod assist, +2 physical assistance Squat pivot transfers: Max assist, +2 physical assistance (to pt's right) General transfer comment: No transfer during OT treatment Ambulation/Gait Ambulation/Gait assistance: Max assist, +2 physical assistance Ambulation Distance (Feet): 4 Feet Assistive device: Rolling walker (2 wheeled) Gait Pattern/deviations: Step-through pattern, Decreased step length - left, Decreased stance time - left, Decreased dorsiflexion - left, Decreased weight shift to right, Leaning posteriorly General Gait Details: PT in front with pt's hands on her shoulders with tech behind pt; pt with no active hip flexion and no attempts to advance LLE himself;     ADL: ADL Overall ADL's : Needs assistance/impaired Grooming: Wash/dry face, Min guard, Sitting Grooming Details (indicate cue type and reason): Patient with increased functional use of LUE during washing face. Patient also with increased cognition, being able to follow one-step commands more consistently  Lower Body Dressing: Moderate assistance, Cueing for safety Toilet Transfer: +2 for physical assistance, Maximal assistance, Squat-pivot General ADL Comments: Patient able to bring RLE to self for LB ADLs, patient requries assistance to manage LLE. Patient able to engage in bed mobility with min assist (1 person). Patient easily distracted and with poor attention during this treatment. No transfer performed secondary to nursing waiting to give patient sponge bath in bed.   Cognition: Cognition Overall Cognitive Status: Impaired/Different from baseline Arousal/Alertness: Awake/alert Orientation Level: Oriented X4 Attention:  Focused, Sustained, Selective Focused Attention: Appears intact Sustained Attention: Impaired Sustained Attention Impairment: Verbal basic, Functional basic Selective Attention: Impaired Selective Attention Impairment: Verbal basic, Functional basic Awareness: Impaired Awareness Impairment: Emergent impairment, Anticipatory impairment, Intellectual impairment Problem Solving: Impaired Problem Solving Impairment: Functional basic Executive Function: Sequencing, Self Correcting, Initiating, Organizing, Reasoning Reasoning: Impaired Sequencing: Impaired Organizing: Impaired Initiating: Impaired Self Correcting: Impaired Safety/Judgment: Impaired Cognition Arousal/Alertness: Awake/alert Behavior During Therapy: Restless Overall Cognitive Status: Impaired/Different from baseline Area of Impairment: Attention, Memory, Following commands, Safety/judgement, Awareness, Problem solving Orientation Level: Disoriented to, Place, Time, Situation Current Attention Level: Sustained Memory: Decreased short-term memory Following Commands: Follows one step commands with increased time Safety/Judgement: Decreased awareness of deficits, Decreased awareness of safety Awareness: Intellectual Problem Solving: Slow processing, Difficulty sequencing, Requires verbal cues, Requires tactile cues General Comments: most of session spoke English; occ responded to Spanish instructions more quickly; poor safety awareness with repeated attempts to stand on his own  Physical Exam: Blood pressure 106/69, pulse 69, temperature 98.4 F (36.9 C), temperature source Oral, resp. rate 18, height 5\' 7"  (1.702 m), weight 95.7 kg (210 lb 15.7 oz), SpO2 94 %. Physical Exam  Vitals reviewed. HENT: oral mucosa pink, dentition poor Cranioplasty site clean and dry with staples intact  Eyes:  Pupils round and reactive to light without nystagmus  Neck: Normal range of motion. Neck supple. No thyromegaly present.    Cardiovascular: Normal rate and regular rhythm. no murmurs or rubs or gallops Respiratory: Effort normal and breath sounds normal. No respiratory distress.  No wheezes GI: Soft. Bowel sounds are normal. He exhibits no distension.  Neurological: no focal CN abnormalities. Tracks to all fields. No tongue or facial deviation Patient is alert and makes good eye contact with examiner. His English is fair for general question when context and vocabulary are kept simple.. Patient provides name, age and place. He does follow simple commands. UES: 4/5 delt,tric,bic,wrist, HI. Mild left pronator drift. LES: 3/5 HF, 4-  KE and ADF/APF. Withdraws to pain on all 4.  Skin: Skin is warm and dry.  Psych: mood pleasant and cooperative    Lab Results Last 48 Hours    Results for orders placed or performed during the hospital encounter of 05/07/14 (from the past 48 hour(s))  Glucose, capillary Status: Abnormal   Collection Time: 05/23/14 11:36 AM  Result Value Ref Range   Glucose-Capillary 206 (H) 70 - 99 mg/dL   Comment 1 Notify RN   Glucose, capillary Status: Abnormal   Collection Time: 05/23/14 4:24 PM  Result Value Ref Range   Glucose-Capillary 193 (H) 70 - 99 mg/dL   Comment 1 Notify RN   Glucose, capillary Status: Abnormal   Collection Time: 05/23/14 9:59 PM  Result Value Ref Range   Glucose-Capillary 183 (H) 70 - 99 mg/dL   Comment 1 Documented in Chart    Comment 2 Notify RN   Glucose, capillary Status: Abnormal   Collection Time: 05/24/14 6:15 AM  Result Value Ref Range   Glucose-Capillary 124 (H) 70 - 99 mg/dL   Comment 1 Documented in Chart    Comment 2 Notify RN   Glucose, capillary Status: Abnormal   Collection Time: 05/24/14 11:16 AM  Result Value Ref Range   Glucose-Capillary 148 (H) 70 - 99 mg/dL   Comment 1 Notify RN   Glucose, capillary Status: Abnormal   Collection  Time: 05/24/14 4:17 PM  Result Value Ref Range   Glucose-Capillary 168 (H) 70 - 99 mg/dL   Comment 1 Notify RN   Glucose, capillary Status: Abnormal   Collection Time: 05/24/14 9:05 PM  Result Value Ref Range   Glucose-Capillary 143 (H) 70 - 99 mg/dL   Comment 1 Documented in Chart    Comment 2 Notify RN   Glucose, capillary Status: Abnormal   Collection Time: 05/25/14 6:50 AM  Result Value Ref Range   Glucose-Capillary 148 (H) 70 - 99 mg/dL      Imaging Results (Last 48 hours)    No results found.       Medical Problem List and Plan: 1. Functional deficits secondary to bilateral frontoparietal craniectomy for tumor excision of meningioma 05/08/2014 with cranioplasty 05/22/2014 2. DVT Prophylaxis/Anticoagulation: Subcutaneous heparin. Monitor platelet counts of any signs of bleeding 3. Pain Management: Hydrocodone as needed. Monitor with increased mobility 4. Dysphagia. Dysphagia 2 thin liquid diet. Follow-up speech therapy 5. Neuropsych: This patient is capable of making decisions on his own behalf. 6. Skin/Wound Care: Routine skin checks of surgical site 7. Fluids/Electrolytes/Nutrition: Strict I and O in follow-up chemistries 8. Seizure prophylaxis. Keppra 500 mg twice a day. Monitor for any seizure activity 9. Hypothyroidism. Synthroid    Post Admission Physician Evaluation: 1. Functional deficits secondary to mengioma and subsequent resection/cranioplasty. 2. Patient is admitted to receive collaborative, interdisciplinary care between the physiatrist, rehab nursing staff, and therapy team. 3. Patient's level of medical complexity and substantial therapy needs in context of that medical necessity cannot be provided at a lesser intensity of care such as a SNF. 4. Patient has experienced substantial functional loss from his/her baseline which was documented above under the "Functional History" and "Functional Status"  headings. Judging by the patient's diagnosis, physical exam, and functional history, the patient has potential for functional progress which will result in measurable gains while on inpatient rehab. These gains will be of substantial and practical use upon discharge in facilitating mobility and self-care at the household level. 5. Physiatrist will provide 24 hour management of medical needs  as well as oversight of the therapy plan/treatment and provide guidance as appropriate regarding the interaction of the two. 6. 24 hour rehab nursing will assist with bladder management, bowel management, safety, skin/wound care, disease management, medication administration, pain management and patient education and help integrate therapy concepts, techniques,education, etc. 7. PT will assess and treat for/with: Lower extremity strength, range of motion, stamina, balance, functional mobility, safety, adaptive techniques and equipment, NMR, cognitive perceptual rx, pain mgt, family ed. Goals are: mod I to supervision. 8. OT will assess and treat for/with: ADL's, functional mobility, safety, upper extremity strength, adaptive techniques and equipment, NMR, cognitive pereceptual rx, sbalance, family ed, ego support. Goals are: supervision to mod I. Therapy may not yet proceed with showering this patient. 9. SLP will assess and treat for/with: cognition, communication,swallowing. Goals are: mod I to supervision. 10. Case Management and Social Worker will assess and treat for psychological issues and discharge planning. 11. Team conference will be held weekly to assess progress toward goals and to determine barriers to discharge. 12. Patient will receive at least 3 hours of therapy per day at least 5 days per week. 13. ELOS: 8-13 days  14. Prognosis: excellent     Meredith Staggers, MD, [place] [facility] 05/25/2014

## 2014-05-25 NOTE — Anesthesia Postprocedure Evaluation (Signed)
Anesthesia Post Note  Patient: Geoffrey Neal  Procedure(s) Performed: Procedure(s) (LRB): CRANIOPLASTY (N/A)  Anesthesia type: General  Patient location: PACU  Post pain: Pain level controlled and Adequate analgesia  Post assessment: Post-op Vital signs reviewed, Patient's Cardiovascular Status Stable, Respiratory Function Stable, Patent Airway and Pain level controlled  Last Vitals:  Filed Vitals:   05/25/14 1416  BP: 100/62  Pulse: 85  Temp: 36.9 C  Resp: 18    Post vital signs: Reviewed and stable  Level of consciousness: awake, alert  and oriented  Complications: No apparent anesthesia complications

## 2014-05-26 ENCOUNTER — Inpatient Hospital Stay (HOSPITAL_COMMUNITY): Payer: Medicare Other | Admitting: Speech Pathology

## 2014-05-26 ENCOUNTER — Inpatient Hospital Stay (HOSPITAL_COMMUNITY): Payer: Medicare Other

## 2014-05-26 LAB — COMPREHENSIVE METABOLIC PANEL
ALBUMIN: 3 g/dL — AB (ref 3.5–5.2)
ALT: 31 U/L (ref 0–53)
ANION GAP: 9 (ref 5–15)
AST: 19 U/L (ref 0–37)
Alkaline Phosphatase: 68 U/L (ref 39–117)
BILIRUBIN TOTAL: 0.5 mg/dL (ref 0.3–1.2)
BUN: 18 mg/dL (ref 6–23)
CO2: 25 mmol/L (ref 19–32)
Calcium: 8.5 mg/dL (ref 8.4–10.5)
Chloride: 101 mmol/L (ref 96–112)
Creatinine, Ser: 0.77 mg/dL (ref 0.50–1.35)
GFR calc Af Amer: >90 mL/min (ref >90)
GFR calc non Af Amer: 90 mL/min — ABNORMAL LOW (ref >90)
Glucose, Bld: 123 mg/dL — ABNORMAL HIGH (ref 70–99)
Potassium: 4.2 mmol/L (ref 3.5–5.1)
Sodium: 135 mmol/L (ref 135–145)
TOTAL PROTEIN: 5.9 g/dL — AB (ref 6.0–8.3)

## 2014-05-26 LAB — CBC WITH DIFFERENTIAL/PLATELET
BASOS ABS: 0 10*3/uL (ref 0.0–0.1)
Basophils Relative: 0 % (ref 0–1)
EOS ABS: 0.1 10*3/uL (ref 0.0–0.7)
EOS PCT: 1 % (ref 0–5)
HCT: 36.4 % — ABNORMAL LOW (ref 39.0–52.0)
Hemoglobin: 12.2 g/dL — ABNORMAL LOW (ref 13.0–17.0)
LYMPHS PCT: 22 % (ref 12–46)
Lymphs Abs: 1.5 10*3/uL (ref 0.7–4.0)
MCH: 29.8 pg (ref 26.0–34.0)
MCHC: 33.5 g/dL (ref 30.0–36.0)
MCV: 89 fL (ref 78.0–100.0)
Monocytes Absolute: 0.5 10*3/uL (ref 0.1–1.0)
Monocytes Relative: 7 % (ref 3–12)
Neutro Abs: 4.9 10*3/uL (ref 1.7–7.7)
Neutrophils Relative %: 70 % (ref 43–77)
Platelets: 199 10*3/uL (ref 150–400)
RBC: 4.09 MIL/uL — ABNORMAL LOW (ref 4.22–5.81)
RDW: 13.1 % (ref 11.5–15.5)
WBC: 7 10*3/uL (ref 4.0–10.5)

## 2014-05-26 MED ORDER — TRAZODONE HCL 50 MG PO TABS
50.0000 mg | ORAL_TABLET | Freq: Every evening | ORAL | Status: DC | PRN
  Administered 2014-05-26 – 2014-05-27 (×2): 50 mg via ORAL
  Filled 2014-05-26 (×2): qty 1

## 2014-05-26 MED ORDER — LIDOCAINE HCL 2 % EX GEL
CUTANEOUS | Status: DC | PRN
  Filled 2014-05-26: qty 5

## 2014-05-26 NOTE — Evaluation (Signed)
Speech Language Pathology Assessment and Plan  Patient Details  Name: Geoffrey Neal MRN: [id number] Date of Birth: 01-21-1946  SLP Diagnosis: Cognitive Impairments  Rehab Potential: Excellent ELOS: 2-2.5 weeks    Today's Date: 05/26/2014 SLP Individual Time: 1000-1100 SLP Individual Time Calculation (min): 60 min   Problem List:  Patient Active Problem List   Diagnosis Date Noted  . Meningioma   . Brain tumor 05/07/2014  . CONSTIPATION 11/16/2009  . 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 HYPERGLYCEMIA 09/29/2008  . 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 GERD 02/18/2007  . VITILIGO 02/11/2007  . HYPOTHYROIDISM 02/04/2007  . DEGENERATIVE JOINT DISEASE 02/04/2007  . ARTHRALGIA 02/04/2007   Past Medical History:  Past Medical History  Diagnosis Date  . Elevated PSA   . Frequency of urination   . Mixed hyperlipidemia     MILD PER PT  . Nocturia   . DJD (degenerative joint disease)   . Hypothyroidism   . Diabetes mellitus without complication     ?  Marland Kitchen History of kidney stones     yrs ago  . GERD (gastroesophageal reflux disease)     occ   Past Surgical History:  Past Surgical History  Procedure Laterality Date  . Prostate biopsy N/A 07/19/2012    Procedure: EUA, PROSTATE BIOPSY TRANSRECTAL ULTRASONIC PROSTATE (TUBP);  Surgeon: Fredricka Bonine, MD;  Location: [facility];  Service: Urology;  Laterality: N/A;  . Tonsillectomy    . Radiology with anesthesia N/A 05/07/2014    Procedure: RADIOLOGY WITH ANESTHESIA/EMBOLIZATION;  Surgeon: Rob Hickman, MD;  Location: [place];  Service: Radiology;  Laterality: N/A;  . Craniotomy Bilateral 05/08/2014    Procedure: BILATERAL FRONTAL PARIETAL CRANIOTOMY TUMOR EXCISION;  Surgeon: Ashok Pall, MD;  Location: [place] [facility];  Service: Neurosurgery;  Laterality: Bilateral;  . Cranioplasty N/A 05/22/2014    Procedure: CRANIOPLASTY;  Surgeon: Ashok Pall, MD;  Location: [place];  Service: Neurosurgery;  Laterality: N/A;   Cranioplasty    Assessment / Plan / Recommendation Clinical Impression Patient is a 69 y.o. right handed limited-English speaking male with history of approximately 2 months right frontal scalp mass with intermittent headaches and blurred vision. 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 Patient admitted 05/08/2013 underwent cerebral angiogram with embolization of the mass followed by bilateral frontoparietal craniectomy for tumor excision 05/08/2014 per Dr. Cyndy Freeze. Maintained on Decadron protocol. Keppra for seizure prophylaxis. Patient later underwent cranioplasty for skull defect post meningioma resection 05/22/2014. Subcutaneous heparin initiated for DVT prophylaxis 05/10/2014. Patient tolerating dysphagia 2 textures and thin liquids. Physical and Occupational therapy evaluation completed ongoing with recommendations of physical medicine and rehabilitation consult. Patient admitted to CIR on 05/25/14 and demonstrates moderate cognitive impairments characterized by decreased attention, short-term memory, functional problem solving and intellectual awareness of deficits which impacts his ability to perform functional and familiar tasks safely. Patient would benefit from skilled SLP intervention to maximize his cognitive-linguistic function and overall functional independence prior to discharge home. Anticipate patient will require 24 hour supervision and f/u SLP services.   Skilled Therapeutic Interventions          Administered a cognitive-linguistic evaluation. Please see above for details. Educated the patient and his wife in regards to his current cognitive-linguistic function and goals of skilled SLP intervention. Both verbalized understanding but need reinforcement.   SLP Assessment  Patient will need skilled [place] [facility]  during CIR admission    Recommendations  Oral Care Recommendations: Oral care BID Patient destination: Home Follow up Recommendations: 24 hour supervision/assistance;Outpatient SLP;Home Health SLP Equipment Recommended: None recommended by SLP    SLP Frequency 5 out of 7 days   SLP Treatment/Interventions Cognitive remediation/compensation;Cueing hierarchy;Functional tasks;Environmental controls;Internal/external aids;Speech/Language facilitation;Patient/family education;Therapeutic Activities    Pain Pain Assessment Pain Assessment: 0-10 Pain Score: 6  Pain Type: Acute pain Pain Location: Back Pain Orientation: Left;Right Prior Functioning Type of Home: House  Lives With: Spouse;Family Available Help at Discharge: Family;Available 24 hours/day  Short Term Goals: Week 1: SLP Short Term Goal 1 (Week 1): Patient will demonstrate functional problem solving during basic and familiar tasks with Min A multimodal cues.  SLP Short Term Goal 2 (Week 1): Patient will recall new, daily information with Min A multimodal cues.  SLP Short Term Goal 3 (Week 1): Patient will identify 2 physical and 2 cognitive deficits with Min A multimodal cues.  SLP Short Term Goal 4 (Week 1): Patient will demonstrate selective attention in a mildly distracting enviornment for 60 minutes with Min A multimodal cues for redirection.  SLP Short Term Goal 5 (Week 1): Patient will utilize call bell to request assistance with Min A multimodal cues.   See FIM for current functional status Refer to Care Plan for Long Term Goals  Recommendations for other services: None  Discharge Criteria: Patient will be discharged from SLP if patient refuses treatment 3 consecutive times without medical reason, if treatment goals not met, if there is a change in medical status, if patient makes no progress towards goals or if patient is discharged from hospital.  The above assessment, treatment plan, treatment alternatives and goals  were discussed and mutually agreed upon: by patient and by family  Geoffrey Neal 05/26/2014, 12:21 PM

## 2014-05-26 NOTE — Plan of Care (Deleted)
Pt's plan of care adjusted to Q.D. after speaking with care team and discussed with MD in team conference as pt currently unable to tolerate current therapy schedule with OT and PT.   [name], PT, DPT

## 2014-05-26 NOTE — Evaluation (Signed)
Occupational Therapy Assessment and Plan  Patient Details  Name: Geoffrey Neal MRN: [id number] Date of Birth: March 12, 1946  OT Diagnosis: cognitive deficits, disturbance of vision, hemiplegia affecting non-dominant side and muscle weakness (generalized) Rehab Potential: Rehab Potential (ACUTE ONLY): Good ELOS: 14-18 days   Today's Date: 05/26/2014 OT Individual Time: 0900-1000 OT Individual Time Calculation (min): 60 min     Problem List:  Patient Active Problem List   Diagnosis Date Noted  . Meningioma   . Brain tumor 05/07/2014  . CONSTIPATION 11/16/2009  . 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 HYPERGLYCEMIA 09/29/2008  . 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 GERD 02/18/2007  . VITILIGO 02/11/2007  . HYPOTHYROIDISM 02/04/2007  . DEGENERATIVE JOINT DISEASE 02/04/2007  . ARTHRALGIA 02/04/2007    Past Medical History:  Past Medical History  Diagnosis Date  . Elevated PSA   . Frequency of urination   . Mixed hyperlipidemia     MILD PER PT  . Nocturia   . DJD (degenerative joint disease)   . Hypothyroidism   . Diabetes mellitus without complication     ?  Marland Kitchen History of kidney stones     yrs ago  . GERD (gastroesophageal reflux disease)     occ   Past Surgical History:  Past Surgical History  Procedure Laterality Date  . Prostate biopsy N/A 07/19/2012    Procedure: EUA, PROSTATE BIOPSY TRANSRECTAL ULTRASONIC PROSTATE (TUBP);  Surgeon: Fredricka Bonine, MD;  Location: [facility];  Service: Urology;  Laterality: N/A;  . Tonsillectomy    . Radiology with anesthesia N/A 05/07/2014    Procedure: RADIOLOGY WITH ANESTHESIA/EMBOLIZATION;  Surgeon: Rob Hickman, MD;  Location: [place];  Service: Radiology;  Laterality: N/A;  . Craniotomy Bilateral 05/08/2014    Procedure: BILATERAL FRONTAL PARIETAL CRANIOTOMY TUMOR EXCISION;  Surgeon: Ashok Pall, MD;  Location: [place] [facility];  Service: Neurosurgery;  Laterality: Bilateral;  . Cranioplasty N/A 05/22/2014    Procedure:  CRANIOPLASTY;  Surgeon: Ashok Pall, MD;  Location: [place];  Service: Neurosurgery;  Laterality: N/A;  Cranioplasty    Assessment & Plan Clinical Impression: Geoffrey Neal is a 69 y.o. right handed limited-English speaking male with history of approximately 2 months right frontal scalp mass with intermittent headaches and blurred vision. 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 Patient admitted 05/08/2013 underwent cerebral angiogram with embolization of the mass followed by bilateral frontoparietal craniectomy for tumor excision 05/08/2014 per Dr. Cyndy Freeze. Maintained on Decadron protocol. Keppra for seizure prophylaxis. Patient later underwent cranioplasty for skull defect post meningioma resection 05/22/2014. Subcutaneous heparin initiated for DVT prophylaxis 05/10/2014. Patient tolerating a dysphagia 2 thin liquid diet. Physical and Occupational therapy evaluation completed ongoing with recommendations of physical medicine and rehabilitation consult. Patient transferred to CIR on 05/25/2014 .    Patient currently requires min-mod assist overall with basic self-care tasks and functional transfers, however requiring max A for LB dressing secondary to muscle weakness, decreased cardiorespiratoy endurance, decreased attention to left and decreased motor planning, decreased initiation, decreased attention, decreased awareness, decreased problem solving, decreased safety awareness and decreased memory and decreased standing balance, decreased postural control and decreased balance strategies.  Prior to hospitalization, patient could complete BADLs with independent .  Patient will benefit from skilled intervention to increase independence with basic self-care skills prior to discharge home with care partner.  Anticipate patient will require 24 hour  supervision and  follow up home health.  OT - End of Session Activity Tolerance: Decreased this session Endurance Deficit: Yes Endurance Deficit Description: rest breaks OT Assessment Rehab Potential (ACUTE ONLY): Good OT Patient demonstrates impairments in the following area(s): Balance;Cognition;Endurance;Motor;Sensory;Safety;Perception;Vision OT Basic ADL's Functional Problem(s): Grooming;Bathing;Dressing;Toileting;Eating OT Transfers Functional Problem(s): Toilet;Tub/Shower OT Additional Impairment(s): Fuctional Use of Upper Extremity (L hemiplegia) OT Plan OT Intensity: Minimum of 1-2 x/day, 45 to 90 minutes OT Frequency: 5 out of 7 days OT Duration/Estimated Length of Stay: 14-18 days OT Treatment/Interventions: Balance/vestibular training;Cognitive remediation/compensation;Discharge planning;Community reintegration;Functional mobility training;DME/adaptive equipment instruction;Neuromuscular re-education;Functional electrical stimulation;Psychosocial support;Patient/family education;Self Care/advanced ADL retraining;Therapeutic Activities;Splinting/orthotics;Therapeutic Exercise;UE/LE Strength taining/ROM;UE/LE Coordination activities;Visual/perceptual remediation/compensation;Wheelchair propulsion/positioning OT Self Feeding Anticipated Outcome(s): supervision OT Basic Self-Care Anticipated Outcome(s): supervision  OT Toileting Anticipated Outcome(s): supervision  OT Bathroom Transfers Anticipated Outcome(s): supervision  OT Recommendation Patient destination: Home Follow Up Recommendations: Home health OT Equipment Recommended: To be determined   Skilled Therapeutic Intervention OT eval completed with interpreter and wife present. Discussed role of OT, goals of therapy, possible ELOS, safety plan, and fall risk. Pt received supine in bed and agreeable to bathing and dressing at sink this AM. Pt demonstrated inattention to LLE during bed mobility and functional transfers, requiring  max verbal and tactile cues for positioning. Pt required min-mod A for stand pivot transfers and sit<>stand with decreased motor planning noted. Pt required mod cues for initiation, sequencing and problem solving throughout self-care tasks. Pt utilized LUE functionally throughout session without cues and mild weakness noted.   OT Evaluation Precautions/Restrictions  Precautions Precautions: Fall Restrictions Weight Bearing Restrictions: No General   Vital Signs   Pain Pain Assessment Pain Assessment: 0-10 Pain Score: 6  Pain Type: Acute pain Pain Location: Back Pain Orientation: Left;Right Pain Descriptors / Indicators: Aching Pain Frequency: Intermittent Pain Onset: With Activity Patients Stated Pain Goal: 3 Pain Intervention(s): Medication (See eMAR) Home Living/Prior Functioning Home Living Available Help at Discharge: Family, Available 24 hours/day Type of Home: House Home Access: Stairs to enter [employer] of Steps: 2 Entrance Stairs-Rails: None Home Layout: Two level, Able to live on main level with bedroom/bathroom  Lives With: Spouse, Family Prior Function Level of Independence: Independent with basic ADLs, Independent with gait, Independent with transfers Vocation Requirements: worek in sheet metal  Leisure: Hobbies-yes (Comment) Comments: sports ADL   Vision/Perception  Vision- History Baseline Vision/History: Wears glasses Wears Glasses: Reading only Patient Visual Report: No change from baseline;Other (comment) (pt reports "nervous ticks" cause frequent blinking) Vision- Assessment Vision Assessment?: Yes Eye Alignment: Within Functional Limits Ocular Range of Motion: Within Functional Limits Alignment/Gaze Preference: Within Defined Limits Tracking/Visual Pursuits: Requires cues, head turns, or add eye shifts to track;Decreased smoothness of horizontal tracking Saccades: Within functional limits Convergence: Within functional limits Visual  Fields: No apparent deficits Additional Comments: pt with frequent blinking during assessment, reporting "nervous ticks" Perception Comments: mild inattention to LLE  Cognition Overall Cognitive Status: Impaired/Different from baseline Arousal/Alertness: Awake/alert Orientation Level: Oriented X4 Attention: Sustained;Selective Focused Attention: Appears intact Sustained Attention: Appears intact Sustained Attention Impairment: Functional basic Selective Attention: Impaired Selective Attention Impairment: Functional basic Memory: Appears intact Memory Impairment: Decreased recall of new information Awareness: Impaired Awareness Impairment: Intellectual impairment Problem Solving: Impaired Problem Solving Impairment: Functional basic Executive Function: Self Monitoring;Self Correcting;Reasoning Reasoning: Impaired Reasoning Impairment: Functional basic Sequencing: Impaired Sequencing Impairment: Functional basic Self Monitoring: Impaired Self Monitoring Impairment: Functional basic Self Correcting: Impaired Self Correcting Impairment: Functional basic Safety/Judgment: Impaired Sensation Sensation Light Touch: Impaired Detail Light Touch Impaired Details: Impaired  LLE Hot/Cold: Appears Intact Coordination Gross Motor Movements are Fluid and Coordinated: No Fine Motor Movements are Fluid and Coordinated: No Coordination and Movement Description: decreased coordination in LLE due to weakness and motor planning deficits Finger Nose Finger Test: slightly increased time with LUE however still functional Motor  Motor Motor: Motor apraxia Motor - Skilled Clinical Observations: decreased motor planning and intiation in LLE Mobility  Bed Mobility Bed Mobility: Supine to Sit;Rolling Left;Rolling Right;Sit to Supine Rolling Right: 5: Supervision Rolling Right Details: Verbal cues for technique;Verbal cues for sequencing Rolling Left: 5: Supervision Rolling Left Details: Verbal  cues for technique;Verbal cues for sequencing Supine to Sit: 4: Min guard Supine to Sit Details: Verbal cues for sequencing;Verbal cues for precautions/safety;Tactile cues for initiation Supine to Sit Details (indicate cue type and reason): min A to manage LLE Sit to Supine: 4: Min guard Sit to Supine - Details: Verbal cues for sequencing;Verbal cues for precautions/safety;Tactile cues for initiation Transfers Transfers: Sit to Stand;Stand to Sit Sit to Stand: 3: Mod assist Sit to Stand Details: Tactile cues for initiation;Tactile cues for posture;Verbal cues for sequencing;Manual facilitation for placement Stand to Sit: 3: Mod assist;With upper extremity assist;With armrests;To bed Stand to Sit Details (indicate cue type and reason): Tactile cues for initiation;Tactile cues for posture;Verbal cues for sequencing  Trunk/Postural Assessment  Cervical Assessment Cervical Assessment: Within Functional Limits Thoracic Assessment Thoracic Assessment: Within Functional Limits Lumbar Assessment Lumbar Assessment: Within Functional Limits Postural Control Postural Control: Within Functional Limits  Balance Balance Balance Assessed: Yes Dynamic Sitting Balance Dynamic Sitting - Balance Support: Feet supported;During functional activity Dynamic Sitting - Level of Assistance: 5: Stand by assistance Static Standing Balance Static Standing - Balance Support: Bilateral upper extremity supported Static Standing - Level of Assistance: 4: Min assist Dynamic Standing Balance Dynamic Standing - Balance Support: During functional activity (alternating UE support) Dynamic Standing - Level of Assistance: 3: Mod assist;4: Min assist Extremity/Trunk Assessment RUE Assessment RUE Assessment: Within Functional Limits (4+/5 strength) LUE Assessment LUE Assessment: Within Functional Limits (AROM WFL; minimal weakness 4-/5 overall)  FIM:  FIM - Grooming Grooming Steps: Wash, rinse, dry face;Wash, rinse,  dry hands Grooming: 5: Set-up assist to obtain items FIM - Bathing Bathing Steps Patient Completed: Right Arm;Chest;Front perineal area;Buttocks;Right upper leg;Left Arm;Abdomen;Left upper leg Bathing: 4: Min-Patient completes 8-9 [id number] parts or 75+ percent FIM - Upper Body Dressing/Undressing Upper body dressing/undressing steps patient completed: Thread/unthread right sleeve of pullover shirt/dresss;Thread/unthread left sleeve of pullover shirt/dress;Put head through opening of pull over shirt/dress Upper body dressing/undressing: 4: Min-Patient completed 75 plus % of tasks FIM - Lower Body Dressing/Undressing Lower body dressing/undressing steps patient completed: Thread/unthread right underwear leg;Thread/unthread right pants leg Lower body dressing/undressing: 2: Max-Patient completed 25-49% of tasks FIM - BControl and instrumentation engineerDevices: Arm rests;Walker Bed/Chair Transfer: 4: Supine > Sit: Min A (steadying Pt. > 75%/lift 1 leg);4: Bed > Chair or W/C: Min A (steadying Pt. > 75%);3: Bed > Chair or W/C: Mod A (lift or lower assist);4: Chair or W/C > Bed: Min A (steadying Pt. > 75%) FIM - TRadio producerDevices: Grab bars Toilet Transfers: 3-To toilet/BSC: Mod A (lift or lower assist);4-From toilet/BSC: Min A (steadying Pt. > 75%)   Refer to Care Plan for Long Term Goals  Recommendations for other services: None  Discharge Criteria: Patient will be discharged from OT if patient refuses treatment 3 consecutive times without medical reason, if treatment goals not met, if there is a change in  medical status, if patient makes no progress towards goals or if patient is discharged from hospital.  The above assessment, treatment plan, treatment alternatives and goals were discussed and mutually agreed upon: by patient and by family  Duayne Cal 05/26/2014, 9:49 AM

## 2014-05-26 NOTE — Progress Notes (Signed)
PHYSICAL MEDICINE & REHABILITATION     PROGRESS NOTE    Subjective/Complaints: No complaints except for room being cold last night. Ready for therapies today. Denies pain, sob,c,n,v,d  Objective: Vital Signs: Blood pressure 111/80, pulse 69, temperature 98.3 F (36.8 C), temperature source Oral, resp. rate 17, SpO2 97 %. No results found.  Recent Labs  05/25/14 1620 05/26/14 0617  WBC 7.6 7.0  HGB 11.9* 12.2*  HCT 35.2* 36.4*  PLT 202 199    Recent Labs  05/25/14 1620 05/26/14 0617  NA  --  135  K  --  4.2  CL  --  101  GLUCOSE  --  123*  BUN  --  18  CREATININE 0.92 0.77  CALCIUM  --  8.5   CBG (last 3)   Recent Labs  05/24/14 2105 05/25/14 0650 05/25/14 1145  GLUCAP 143* 148* 119*    Wt Readings from Last 3 Encounters:  05/07/14 95.7 kg (210 lb 15.7 oz)  08/17/13 93.895 kg (207 lb)  07/19/12 93.583 kg (206 lb 5 oz)    Physical Exam:  HENT: oral mucosa pink, dentition poor Cranioplasty site clean and dry with staples intact  Eyes:  Pupils round and reactive to light without nystagmus  Neck: Normal range of motion. Neck supple. No thyromegaly present.  Cardiovascular: Normal rate and regular rhythm. no murmurs or rubs or gallops Respiratory: Effort normal and breath sounds normal. No respiratory distress. No wheezes GI: Soft. Bowel sounds are normal. He exhibits no distension.  Neurological: no focal CN abnormalities. Tracks to all fields. No tongue or facial deviation Patient is alert and makes good eye contact with examiner. Speaks in broken english.  Patient provides name, age and place. He does follow simple commands. UES: 4/5 delt,tric,bic,wrist, HI. Mild left pronator drift. LES: 3/5 HF, 4- KE and ADF/APF. Withdraws to pain on all 4.  Skin: Skin is warm and dry.  Psych: mood pleasant and cooperative  Assessment/Plan: 1. Functional deficits secondary to meningioma s/p resection/cranioplasty which require 3+ hours per day of  interdisciplinary therapy in a comprehensive inpatient rehab setting. Physiatrist is providing close team supervision and 24 hour management of active medical problems listed below. Physiatrist and rehab team continue to assess barriers to discharge/monitor patient progress toward functional and medical goals. FIM:                   Comprehension Comprehension Mode: Auditory Comprehension: 3-Understands basic 50 - 74% of the time/requires cueing 25 - 50%  of the time  Expression Expression Mode: Verbal Expression: 3-Expresses basic 50 - 74% of the time/requires cueing 25 - 50% of the time. Needs to repeat parts of sentences.  Social Interaction Social Interaction: 4-Interacts appropriately 75 - 89% of the time - Needs redirection for appropriate language or to initiate interaction.  Problem Solving Problem Solving: 3-Solves basic 50 - 74% of the time/requires cueing 25 - 49% of the time  Memory Memory: 3-Recognizes or recalls 50 - 74% of the time/requires cueing 25 - 49% of the time  Medical Problem List and Plan: 1. Functional deficits secondary to bilateral frontoparietal craniectomy for tumor excision of meningioma 05/08/2014 with cranioplasty 05/22/2014 2. DVT Prophylaxis/Anticoagulation: Subcutaneous heparin. Monitor platelet counts of any signs of bleeding 3. Pain Management: Hydrocodone as needed. Monitor with increased mobility 4. Dysphagia. Dysphagia 2 thin liquid diet. Follow-up speech therapy 5. Neuropsych: This patient is capable of making decisions on his own behalf. 6. Skin/Wound Care: Routine skin checks of  surgical site--clean currently 7. Fluids/Electrolytes/Nutrition: Strict I and O in follow-up chemistries normal 8. Seizure prophylaxis. Keppra 500 mg twice a day.  9. Hypothyroidism. Synthroid  LOS (Days) 1 A FACE TO FACE EVALUATION WAS PERFORMED  Berma Harts T 05/26/2014 8:06 AM

## 2014-05-26 NOTE — Progress Notes (Signed)
Patient information reviewed and entered into eRehab system by Davie Claud, RN, CRRN, PPS Coordinator.  Information including medical coding and functional independence measure will be reviewed and updated through discharge.     Per nursing patient was given "Data Collection Information Summary for Patients in Inpatient Rehabilitation Facilities with attached "Privacy Act Statement-Health Care Records" upon admission.  

## 2014-05-26 NOTE — Evaluation (Signed)
Physical Therapy Assessment and Plan  Patient Details  Name: Geoffrey Neal MRN: [id number] Date of Birth: Aug 24, 1945  PT Diagnosis: Abnormality of gait, Cognitive deficits, Hemiplegia non-dominant, Impaired sensation, Low back pain, Muscle weakness and Pain in head, decreased functional endurance and decreased balance. Rehab Potential: Good ELOS: 14-17days   Today's Date: 05/26/2014 PT Individual Time: 1100-1200 PT Individual Time Calculation (min): 60 min    Problem List:  Patient Active Problem List   Diagnosis Date Noted  . Meningioma   . Brain tumor 05/07/2014  . CONSTIPATION 11/16/2009  . 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 HYPERGLYCEMIA 09/29/2008  . 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 GERD 02/18/2007  . VITILIGO 02/11/2007  . HYPOTHYROIDISM 02/04/2007  . DEGENERATIVE JOINT DISEASE 02/04/2007  . ARTHRALGIA 02/04/2007    Past Medical History:  Past Medical History  Diagnosis Date  . Elevated PSA   . Frequency of urination   . Mixed hyperlipidemia     MILD PER PT  . Nocturia   . DJD (degenerative joint disease)   . Hypothyroidism   . Diabetes mellitus without complication     ?  Marland Kitchen History of kidney stones     yrs ago  . GERD (gastroesophageal reflux disease)     occ   Past Surgical History:  Past Surgical History  Procedure Laterality Date  . Prostate biopsy N/A 07/19/2012    Procedure: EUA, PROSTATE BIOPSY TRANSRECTAL ULTRASONIC PROSTATE (TUBP);  Surgeon: Fredricka Bonine, MD;  Location: [facility];  Service: Urology;  Laterality: N/A;  . Tonsillectomy    . Radiology with anesthesia N/A 05/07/2014    Procedure: RADIOLOGY WITH ANESTHESIA/EMBOLIZATION;  Surgeon: Rob Hickman, MD;  Location: [place];  Service: Radiology;  Laterality: N/A;  . Craniotomy Bilateral 05/08/2014    Procedure: BILATERAL FRONTAL PARIETAL CRANIOTOMY TUMOR EXCISION;  Surgeon: Ashok Pall, MD;  Location: [place] [facility];  Service: Neurosurgery;  Laterality: Bilateral;  .  Cranioplasty N/A 05/22/2014    Procedure: CRANIOPLASTY;  Surgeon: Ashok Pall, MD;  Location: [place];  Service: Neurosurgery;  Laterality: N/A;  Cranioplasty    Assessment & Plan Clinical Impression: Geoffrey Neal is a 69 y.o. right handed limited-English speaking male with history of approximately 2 months right frontal scalp mass with intermittent headaches and blurred vision. 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 Patient admitted 05/08/2013 underwent cerebral angiogram with embolization of the mass followed by bilateral frontoparietal craniectomy for tumor excision 05/08/2014 per Dr. Cyndy Freeze. Maintained on Decadron protocol. Keppra for seizure prophylaxis. Patient later underwent cranioplasty for skull defect post meningioma resection 05/22/2014. Subcutaneous heparin initiated for DVT prophylaxis 05/10/2014. Patient tolerating a dysphagia 2 thin liquid diet. Patient transferred to CIR on 05/25/2014 .   Patient currently requires mod with mobility secondary to muscle weakness, decreased cardiorespiratoy endurance, impaired timing and sequencing, decreased coordination and decreased motor planning, decreased attention to left and decreased motor planning and decreased sitting balance, decreased standing balance, decreased postural control, hemiplegia and decreased balance strategies.  Prior to hospitalization, patient was independent  with mobility and lived with Spouse, Family in a House home.  Home access is 2Stairs to enter.  Patient will benefit from skilled PT intervention to maximize safe functional mobility, minimize fall risk and decrease caregiver burden for planned discharge home with 24 hour supervision.  Anticipate patient will benefit from follow up [place] at discharge.  PT -  End of Session Activity Tolerance: Tolerates 30+ min  activity with multiple rests Endurance Deficit: Yes Endurance Deficit Description: rest breaks PT Assessment Rehab Potential (ACUTE/IP ONLY): Good Barriers to Discharge:  (stairs to enter home) PT Patient demonstrates impairments in the following area(s): Balance;Endurance;Motor;Perception;Pain;Safety;Skin Integrity;Sensory;Other (comment) (Strength) PT Transfers Functional Problem(s): Bed Mobility;Bed to Chair;Car;Furniture;Floor PT Locomotion Functional Problem(s): Ambulation;Wheelchair Mobility;Stairs PT Plan PT Intensity: Minimum of 1-2 x/day ,45 to 90 minutes PT Frequency: 5 out of 7 days PT Duration Estimated Length of Stay: 14-17days PT Treatment/Interventions: Ambulation/gait training;Balance/vestibular training;Cognitive remediation/compensation;Discharge planning;Disease management/prevention;DME/adaptive equipment instruction;Patient/family education;Pain management;Neuromuscular re-education;Functional mobility training;Psychosocial support;Stair training;UE/LE Coordination activities;UE/LE Strength taining/ROM;Therapeutic Exercise;Therapeutic Activities;Skin care/wound management;Wheelchair propulsion/positioning;Visual/perceptual remediation/compensation;Splinting/orthotics PT Transfers Anticipated Outcome(s): Supervision PT Locomotion Anticipated Outcome(s): Supervision-Min A PT Recommendation Follow Up Recommendations: Home health PT Patient destination: Home Equipment Recommended: To be determined  Skilled Therapeutic Intervention 1:1. Pt received seated in w/c in room with wife and spanish language interpreter present. PT evaluation performed, see detailed objective information below. Pt educated on rehab environment, role of therapies, goals for physical therapy and general safety plan. Pt verbalized understanding. After initial evaluation, session focused on safety and technique for functional transfers and mobility, w/c propulsion, ambulation with use of RW, and stair  training. Pt requiring overall min-mod A for bed mobility, bed<>w/c transfers and mobility this session with max tactile/verbal cues for motor planning, initiation and sustained attention to L LE during functional tasks. Pt left seated in w/c at end of session w/ all needs in reach, quick release belt in place.  PT Evaluation Precautions/Restrictions Precautions Precautions: Fall Restrictions Weight Bearing Restrictions: No Pain Pain Assessment Pain Assessment: 0-10 Pain Score: 6  Pain Type: Acute pain Pain Location: Back (LBP) Pain Orientation: Lower Pain Descriptors / Indicators: Aching Home Living/Prior Functioning Home Living Available Help at Discharge: Family;Available 24 hours/day Type of Home: House Home Access: Stairs to enter [employer] of Steps: 2 Entrance Stairs-Rails: None Home Layout: Two level;Able to live on main level with bedroom/bathroom Alternate Level Stairs-Number of Steps: 14 steps to second floor  Lives With: Spouse;Family Prior Function Level of Independence: Independent with basic ADLs;Independent with gait;Independent with transfers;Independent with homemaking with ambulation  Able to Take Stairs?: Yes Vocation Requirements: Artist Leisure: Hobbies-yes (Comment) (chess/checkers) Vision/Perception  See OT evaluation for details Cognition Overall Cognitive Status: Impaired/Different from baseline Arousal/Alertness: Awake/alert Orientation Level: Oriented X4 Attention: Sustained;Selective Focused Attention: Appears intact Sustained Attention: Impaired Sustained Attention Impairment: Functional basic Selective Attention: Impaired Selective Attention Impairment: Functional basic Memory: Impaired Memory Impairment: Decreased recall of new information Awareness: Impaired Awareness Impairment: Intellectual impairment Problem Solving: Impaired Problem Solving Impairment: Functional basic Executive Function: Self Monitoring;Self  Correcting;Reasoning Reasoning: Impaired Reasoning Impairment: Functional basic Sequencing: Impaired Self Monitoring: Impaired Self Monitoring Impairment: Functional basic Self Correcting: Impaired Self Correcting Impairment: Functional basic Safety/Judgment: Impaired Sensation Sensation Light Touch: Impaired Detail Light Touch Impaired Details: Impaired LLE Proprioception: Impaired by gross assessment Coordination Gross Motor Movements are Fluid and Coordinated: No Fine Motor Movements are Fluid and Coordinated: No Coordination and Movement Description: decreased coordination in LLE due to weakness and motor planning deficits Motor  Motor Motor: Other (comment) (motor planning) Motor - Skilled Clinical Observations: Pt demonstrated decreased motor planning and difficulty initiating movement in L LE, more pronounced distally. Was able to initiate movement with max tactilce/verbal/visual cues and increased time   Mobility Bed Mobility Bed Mobility: Supine to Sit;Rolling Left;Rolling Right;Sit to Supine Rolling Right: 5: Supervision Rolling Right Details: Verbal cues for technique;Verbal cues for sequencing  Rolling Left: 5: Supervision Rolling Left Details: Verbal cues for technique;Verbal cues for sequencing Supine to Sit: 4: Min guard Supine to Sit Details: Verbal cues for sequencing;Verbal cues for precautions/safety;Tactile cues for initiation Sit to Supine: 4: Min guard Sit to Supine - Details: Verbal cues for sequencing;Verbal cues for precautions/safety;Tactile cues for initiation Transfers Sit to Stand: 3: Mod assist;With armrests;From bed Sit to Stand Details: Tactile cues for initiation;Tactile cues for posture;Verbal cues for sequencing;Manual facilitation for placement;Verbal cues for precautions/safety Stand to Sit: 3: Mod assist;With armrests;To bed Stand to Sit Details (indicate cue type and reason): Tactile cues for initiation;Tactile cues for posture;Verbal cues  for sequencing;Verbal cues for precautions/safety Locomotion  Ambulation Ambulation: Yes Ambulation/Gait Assistance: 3: Mod assist;1: +2 Total assist (modA x2) Ambulation Distance (Feet): 40 Feet Assistive device: Rolling walker Ambulation/Gait Assistance Details: Tactile cues for initiation;Tactile cues for sequencing;Tactile cues for posture;Tactile cues for placement;Verbal cues for sequencing;Verbal cues for technique;Verbal cues for gait pattern;Verbal cues for safe use of DME/AE;Visual cues for safe use of DME/AE Ambulation/Gait Assistance Details: 30/ Gait Gait: Yes Gait Pattern: Decreased stride length;Decreased dorsiflexion - left;Decreased hip/knee flexion - left;Step-to pattern;Decreased step length - left;Poor foot clearance - left;Trunk flexed Stairs / Additional Locomotion Stairs: Yes Stairs Assistance: 3: Mod assist Stairs Assistance Details: Tactile cues for initiation;Tactile cues for sequencing;Tactile cues for posture;Tactile cues for placement;Visual cues/gestures for sequencing;Verbal cues for sequencing;Verbal cues for technique;Verbal cues for gait pattern;Verbal cues for precautions/safety;Manual facilitation for placement Stair Management Technique: Two rails;Step to pattern;Forwards Number of Stairs: 5 Architect: Yes Wheelchair Assistance: 4: Advertising account executive Details: Verbal cues for technique;Verbal cues for sequencing;Visual cues/gestures for sequencing;Tactile cues for placement Wheelchair Propulsion: Both upper extremities Wheelchair Parts Management: Needs assistance Distance: 150  Trunk/Postural Assessment  Cervical Assessment Cervical Assessment: Within Functional Limits Thoracic Assessment Thoracic Assessment: Within Functional Limits Lumbar Assessment Lumbar Assessment: Exceptions to WFL (decreased lordotic curve & posterior pelvic tilt) Postural Control Postural Control: Deficits on evaluation Righting  Reactions: delayed in standing  Balance Balance Balance Assessed: Yes Static Sitting Balance Static Sitting - Balance Support: Feet supported Static Sitting - Level of Assistance: 5: Stand by assistance Dynamic Sitting Balance Dynamic Sitting - Balance Support: Feet supported;During functional activity Dynamic Sitting - Level of Assistance: 5: Stand by assistance Dynamic Sitting - Balance Activities: Lateral lean/weight shifting;Forward lean/weight shifting;Reaching for objects Sitting balance - Comments: Patient able to maintain dynamic sitting balance during LUE exercises without assistance from therapist.  Static Standing Balance Static Standing - Balance Support: Bilateral upper extremity supported Static Standing - Level of Assistance: 4: Min assist Dynamic Standing Balance Dynamic Standing - Balance Support: During functional activity;Bilateral upper extremity supported Dynamic Standing - Level of Assistance: 3: Mod assist;4: Min assist Dynamic Standing - Balance Activities: Lateral lean/weight shifting;Forward lean/weight shifting;Reaching for objects Extremity Assessment  RUE Assessment RUE Assessment: Within Functional Limits (4+/5 strength) LUE Assessment LUE Assessment: Within Functional Limits (AROM WFL; minimal weakness 4-/5 overall) RLE Assessment RLE Assessment: Within Functional Limits LLE Assessment LLE Assessment: Exceptions to WFL LLE AROM (degrees) Overall AROM Left Lower Extremity: Within functional limits for tasks assessed LLE Strength LLE Overall Strength: Deficits LLE Overall Strength Comments: Increased time for motor initiation and apparent decreased ability to follow commands could have resulted in unreliable strength ratings Left Hip Flexion: 4/5 Left Hip ABduction: 3/5 Left Knee Flexion: 3/5 Left Knee Extension: 3+/5 Left Ankle Dorsiflexion: 0/5 Left Ankle Plantar Flexion: 1/5  FIM:  FIM - Control and instrumentation engineer  Devices:  Arm rests;Walker Bed/Chair Transfer: 3: Bed > Chair or W/C: Mod A (lift or lower assist);3: Chair or W/C > Bed: Mod A (lift or lower assist);4: Sit > Supine: Min A (steadying pt. > 75%/lift 1 leg);4: Supine > Sit: Min A (steadying Pt. > 75%/lift 1 leg) FIM - Locomotion: Wheelchair Distance: 150 Locomotion: Wheelchair: 2: Travels 50 - 149 ft with minimal assistance (Pt.>75%) FIM - Locomotion: Ambulation Locomotion: Ambulation Assistive Devices: Administrator Ambulation/Gait Assistance: 3: Mod assist;1: +2 Total assist (modA x2) Locomotion: Ambulation: 1: Two helpers FIM - Locomotion: Stairs Locomotion: Scientist, physiological: Insurance account manager - 2 Locomotion: Stairs: 2: Up and Down 4 - 11 stairs with moderate assistance (Pt: 50 - 74%)   Refer to Care Plan for Long Term Goals  Recommendations for other services: None  Discharge Criteria: Patient will be discharged from PT if patient refuses treatment 3 consecutive times without medical reason, if treatment goals not met, if there is a change in medical status, if patient makes no progress towards goals or if patient is discharged from hospital.  The above assessment, treatment plan, treatment alternatives and goals were discussed and mutually agreed upon: by patient and wife, both verbalizing understanding.  Carlos Levering 05/26/2014, 6:07 PM

## 2014-05-27 ENCOUNTER — Encounter (HOSPITAL_COMMUNITY): Payer: Self-pay | Admitting: *Deleted

## 2014-05-27 ENCOUNTER — Encounter (HOSPITAL_COMMUNITY): Payer: Medicare Other

## 2014-05-27 ENCOUNTER — Inpatient Hospital Stay (HOSPITAL_COMMUNITY): Payer: Medicare Other | Admitting: Physical Therapy

## 2014-05-27 ENCOUNTER — Inpatient Hospital Stay (HOSPITAL_COMMUNITY): Payer: Medicare Other | Admitting: Speech Pathology

## 2014-05-27 NOTE — Progress Notes (Signed)
Social Work Assessment and Plan  Patient Details  Name: Geoffrey Neal MRN: [id number] Date of Birth: [birth year]  Today's Date: 05/27/2014  Problem List:  Patient Active Problem List   Diagnosis Date Noted  . Meningioma   . Brain tumor 05/07/2014  . CONSTIPATION 11/16/2009  . 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 HYPERGLYCEMIA 09/29/2008  . 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 GERD 02/18/2007  . VITILIGO 02/11/2007  . HYPOTHYROIDISM 02/04/2007  . DEGENERATIVE JOINT DISEASE 02/04/2007  . ARTHRALGIA 02/04/2007   Past Medical History:  Past Medical History  Diagnosis Date  . Elevated PSA   . Frequency of urination   . Mixed hyperlipidemia     MILD PER PT  . Nocturia   . DJD (degenerative joint disease)   . Hypothyroidism   . Diabetes mellitus without complication     ?  Marland Kitchen History of kidney stones     yrs ago  . GERD (gastroesophageal reflux disease)     occ   Past Surgical History:  Past Surgical History  Procedure Laterality Date  . Prostate biopsy N/A 07/19/2012    Procedure: EUA, PROSTATE BIOPSY TRANSRECTAL ULTRASONIC PROSTATE (TUBP);  Surgeon: Fredricka Bonine, MD;  Location: [facility];  Service: Urology;  Laterality: N/A;  . Tonsillectomy    . Radiology with anesthesia N/A 05/07/2014    Procedure: RADIOLOGY WITH ANESTHESIA/EMBOLIZATION;  Surgeon: Rob Hickman, MD;  Location: [place];  Service: Radiology;  Laterality: N/A;  . Craniotomy Bilateral 05/08/2014    Procedure: BILATERAL FRONTAL PARIETAL CRANIOTOMY TUMOR EXCISION;  Surgeon: Ashok Pall, MD;  Location: [place] [facility];  Service: Neurosurgery;  Laterality: Bilateral;  . Cranioplasty N/A 05/22/2014    Procedure: CRANIOPLASTY;  Surgeon: Ashok Pall, MD;  Location: [place];  Service: Neurosurgery;  Laterality: N/A;  Cranioplasty   Social History:  reports that he quit smoking about 33 years ago. His smoking use included Cigarettes. He quit after 15 years of use. He has never used smokeless tobacco. He  reports that he does not drink alcohol or use illicit drugs.  Family / Support Systems Marital Status: Married How Long?: 28 years Patient Roles: Spouse, Parent Children: 5 children - 4 in [place]; 1 in [place]      Yesenia Lamb - dtr [phone] Anticipated Caregiver: wife and daughter Ability/Limitations of Caregiver: daughter works 8-5 weekdays. Wife can provide 24/7 supervision to light min assist Caregiver Availability: 24/7  Social History Preferred language: Spanish Religion: Christian Cultural Background: Pt is from [place].  Pt and wife have been in the [place] for 24 years.  Speaks some English, but prefers [place]. Employment Status: Retired Date Retired/Disabled/Unemployed: 2010 Age Retired: 71 Public relations account executive Issues: none reported Guardian/Conservator: N/A   Abuse/Neglect Physical Abuse: Denies Verbal Abuse: Denies Sexual Abuse: Denies Exploitation of patient/patient's resources: Denies Self-Neglect: Denies  Emotional Status Pt's affect, behavior and adjustment status: Pt has a good sense of humor and uses this to cope with his condition and situation.  He reports feeling okay emotional, although he wants to go home soon, he recognizes that he needs more time to get stronger.  Pt reports feeling tired. Recent Psychosocial Issues: Pt reports that his social security is not enough for he and his wife to live on and that his children help them financially. Psychiatric History: none reported Substance Abuse History: none reported  Patient / Family Perceptions, Expectations & Goals Pt/Family understanding of illness & functional limitations: Pt/wife report good understanding of pt's condition and do not have any  unanswered questions at this time. Premorbid pt/family roles/activities: Pt enjoyed spending time with his family.  Pt and wife do everything together. Anticipated changes in roles/activities/participation: Pt is used to doing the driving, the finances, etc.  He will  need his children to help with this for a little while. Pt/family expectations/goals: Pt wants to get stronger and be able to use his leg more.  He also wants to not be as tired.  Community Resources [employer]s: None Premorbid Home Care/DME Agencies: None Transportation available at discharge: children - wife does not Engineer, water: Spouse/significant other, Children Support Systems: Spouse/significant other, Children Type of Residence: Private residence Insurance Resources: [employer], [employer] (specify county) Sports coach) Museum/gallery curator Resources: Radio broadcast assistant Screen Referred: No Living Expenses: Lives with family Money Management: Patient, Family Does the patient have any problems obtaining your medications?: No Home Management: Pt's family can assist with this. Patient/Family Preliminary Plans: Pt plans to return to his home to the care of his wife and dtr, when dtr is not working. Barriers to Discharge: Steps Social Work Anticipated Follow Up Needs: HH/OP Expected length of stay: 14-17 days  Clinical Impression CSW met with pt and his wife on 05-26-14 with the interpreter to introduce self and role of CSW, as well as to complete assessment.  Pt was appreciative of opportunity for rehab, but is not sure he wants to stay longer than a week.  CSW emphasized the importance of pt getting well and stronger before he returns home so that he will not be too much care for his wife.  Pt seemed to understand this.  Pt's dtr will assist, as well, when she is not at work.  Pt and wife were glad to here that someone would be helping to coordinate pt's care for when he leaves the hospital.  Pt and wife do not have any questions at this time, but stated they will let CSW know when they do.  Interpreter was helpful to explain pt's culture, as pt believes in traditional male/male roles.  CSW shared this with pt's therapy team.  CSW will continue to follow and  assist at needed.  Oleta Gunnoe, Silvestre Mesi 05/27/2014, 1:54 PM

## 2014-05-27 NOTE — Progress Notes (Signed)
Physical Therapy Session Note  Patient Details  Name: Geoffrey Neal MRN: [id number] Date of Birth: 23-Mar-1946  Today's Date: 05/27/2014 PT Individual Time: 1100-1200 PT Individual Time Calculation (min): 60 min   Short Term Goals: Week 1:  PT Short Term Goal 1 (Week 1): pt will perfrom squat pivot t/f bed<>w/c with min A PT Short Term Goal 2 (Week 1): pt will consistently propel w/c 150' with supervision in controlled environment PT Short Term Goal 3 (Week 1): pt will ambulate 46' with LRAD and mod Ax1person PT Short Term Goal 4 (Week 1): pt will negotiate 12 stairs with 1 rail and minA PT Short Term Goal 5 (Week 1): Pt will maintain dynamic standing balance during functional task with min Ax1person  Skilled Therapeutic Interventions/Progress Updates:  1:1. Pt received seated in w/c with wife and translator in room. 62 of communication throughout session was conveyed though Patent attorney. Session focused on functional transfers, dynamic balance assessment, w/c propulsion, maintaining sustained attention to task. Pt performed all stand pivot throughout session with modA, w/c<>commode with grab bar, w/c<>therapy mat with RW. Pt propelled w/c 100'x2 with minA for safety and mild L inattention. It was the intention of student PT to initiate berg balance score but realized that pt was not appropriate for this assesment at this time due to his overall decreased standing balance without assistive device. Three trials of TUG were performed and pt best score was 2 minutes 35 seconds (155 seconds) indicating increased risk for falls; performed with L LE ACE wrap to facilitate dorsiflexion, RW and min-mod multimodal cues for L LE advancement, management of RW and overall safety.  Conversation concerning premorbid ability to concentrate was initiated with wife and pt; they both reported that the pt was experiencing decreased ability to focus on tasks. Education was provided about the necessity of  sustained attention due to overall L side inattention for safety during functional transfers and mobility. Pt was returned to room and left sitting in w/c with all needs within reach, quick release belt in place, wife and translator in room.  Therapy Documentation Precautions:  Precautions Precautions: Fall Restrictions Weight Bearing Restrictions: No Pain: Pain Assessment Pain Assessment: 0-10 Pain Score: 0-No pain  See FIM for current functional status  Therapy/Group: Individual Therapy  Carlos Levering 05/27/2014, 12:13 PM

## 2014-05-27 NOTE — Progress Notes (Signed)
Occupational Therapy Session Note  Patient Details  Name: Geoffrey Neal MRN: [id number] Date of Birth: 1945/10/14  Today's Date: 05/27/2014 OT Individual Time: 0900-1000 OT Individual Time Calculation (min): 60 min    Short Term Goals: Week 1:  OT Short Term Goal 1 (Week 1): Pt will complete LB dressing with min A OT Short Term Goal 2 (Week 1): Pt will completed self-care task in standing for 1 min with min A for balance OT Short Term Goal 3 (Week 1): Pt will complete functional transfer with min A and min cues for positioning of LLE OT Short Term Goal 4 (Week 1): Pt will complete bathing with min A and min cues for initiation and sequencing  Skilled Therapeutic Interventions/Progress Updates:    Pt resting in bed with wife and interpretor present.  Pt agreeable to bathing and dressing with sit<>stand from w/c at sink. Pt required mod A for squat pivot transfer to w/c.  Pt required max multimodal cues to attend to LLE and move LLE during transfer. Pt completed bathing, dressing, and grooming tasks (including shaving) at sink.  Pt transitioned to ADL apartment and practiced stepping over into tub and sitting on tub transfer bench.  Pt transferred out of tub by turning and performing squat pivot transfer into w/c.  Pt required max multimodal cues to attend to and use LLE throughout session.  Pt and wife agreed that tub transfer bench would be beneficial at home.  Will continue to assess need for bench or chair as discharge nears.  Focus on activity tolerance, safety awareness, sit<>stand, standing balance, functional transfers, and BADLs.  Therapy Documentation Precautions:  Precautions Precautions: Fall Restrictions Weight Bearing Restrictions: No   Pain: Pain Assessment Pain Assessment: 0-10 Pain Score: 6  Pain Type: Acute pain Pain Location: Head Pain Orientation: Right;Left Pain Descriptors / Indicators: Aching Pain Onset: On-going Pain Intervention(s): Medications administered by  RN during session  See FIM for current functional status  Therapy/Group: Individual Therapy  Leroy Libman 05/27/2014, 10:03 AM

## 2014-05-27 NOTE — Evaluation (Signed)
Speech Language Pathology Bedside Swallow Evaluation   Patient Details  Name: Geoffrey Neal MRN: [id number] Date of Birth: 1945/12/09  SLP Diagnosis: N/A Rehab Potential: N/A ELOS: N/A   Today's Date: 05/27/2014 SLP Individual Time: 1000-1100 SLP Individual Time Calculation (min): 60 min   Problem List:  Patient Active Problem List   Diagnosis Date Noted  . Meningioma   . Brain tumor 05/07/2014  . CONSTIPATION 11/16/2009  . 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 HYPERGLYCEMIA 09/29/2008  . 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 GERD 02/18/2007  . VITILIGO 02/11/2007  . HYPOTHYROIDISM 02/04/2007  . DEGENERATIVE JOINT DISEASE 02/04/2007  . ARTHRALGIA 02/04/2007   Past Medical History:  Past Medical History  Diagnosis Date  . Elevated PSA   . Frequency of urination   . Mixed hyperlipidemia     MILD PER PT  . Nocturia   . DJD (degenerative joint disease)   . Hypothyroidism   . Diabetes mellitus without complication     ?  Marland Kitchen History of kidney stones     yrs ago  . GERD (gastroesophageal reflux disease)     occ   Past Surgical History:  Past Surgical History  Procedure Laterality Date  . Prostate biopsy N/A 07/19/2012    Procedure: EUA, PROSTATE BIOPSY TRANSRECTAL ULTRASONIC PROSTATE (TUBP);  Surgeon: Fredricka Bonine, MD;  Location: [facility];  Service: Urology;  Laterality: N/A;  . Tonsillectomy    . Radiology with anesthesia N/A 05/07/2014    Procedure: RADIOLOGY WITH ANESTHESIA/EMBOLIZATION;  Surgeon: Rob Hickman, MD;  Location: [facility];  Service: Radiology;  Laterality: N/A;  . Craniotomy Bilateral 05/08/2014    Procedure: BILATERAL FRONTAL PARIETAL CRANIOTOMY TUMOR EXCISION;  Surgeon: Ashok Pall, MD;  Location: [facility];  Service: Neurosurgery;  Laterality: Bilateral;  . Cranioplasty N/A 05/22/2014    Procedure: CRANIOPLASTY;  Surgeon: Ashok Pall, MD;  Location: [facility];  Service: Neurosurgery;  Laterality: N/A;  Cranioplasty    Assessment / Plan  / Recommendation Clinical Impression Patient administered a BSE. Patient consumed regular textures with thin liquids via straw and demonstrated efficient mastication without overt s/s of aspiration, therefore, recommend patient upgrade to regular textures. Skilled SLP intervention is not warranted for dysphagia treatment at this time, however, SLP will continue to follow for cognitive goals. Patient and family verbalized understanding of all information with interpreter present. Continue with current plan of care.   Skilled Therapeutic Interventions          Administered a BSE, please see above for details. Patient appeared brighter today and engaged more in functional conversation. Patient also recalled events from morning therapy sessions with supervision question cues. Patient left in wheelchair with wife and interpreter present. Continue with current plan of care.        Recommendations  Diet Recommendations: Thin liquid;Regular Liquid Administration via: Cup;Straw Medication Administration: Whole meds with liquid Supervision: Patient able to self feed Compensations: Slow rate;Small sips/bites Postural Changes and/or Swallow Maneuvers: Seated upright 90 degrees Oral Care Recommendations: Oral care BID Patient destination: Home Follow up Recommendations: 24 hour supervision/assistance;Outpatient SLP;Home Health SLP Equipment Recommended: None recommended by SLP    SLP Frequency 5 out of 7 days   SLP Treatment/Interventions N/A    Pain No/Denies Pain   Short Term Goals: N/A  See FIM for current functional status Refer to Care Plan for Long Term Goals  Recommendations for other services: None  Discharge Criteria: Patient will be discharged from SLP if patient refuses treatment 3 consecutive times without  medical reason, if treatment goals not met, if there is a change in medical status, if patient makes no progress towards goals or if patient is discharged from hospital.  The above  assessment, treatment plan, treatment alternatives and goals were discussed and mutually agreed upon: by patient and by family  [name] 05/27/2014, 11:28 AM

## 2014-05-27 NOTE — Progress Notes (Signed)
Inpatient Rehabilitation Center Individual Statement of Services  Patient Name:  Geoffrey Neal  Date:  05/27/2014  Welcome to the [facility].  Our goal is to provide you with an individualized program based on your diagnosis and situation, designed to meet your specific needs.  With this comprehensive rehabilitation program, you will be expected to participate in at least 3 hours of rehabilitation therapies Monday-Friday, with modified therapy programming on the weekends.  Your rehabilitation program will include the following services:  Physical Therapy (PT), Occupational Therapy (OT), Speech Therapy (ST), 24 hour per day rehabilitation nursing, Case Management (Social Worker), Rehabilitation Medicine, Nutrition Services and Pharmacy Services  Weekly team conferences will be held on Tuesdays to discuss your progress.  Your Social Worker will talk with you frequently to get your input and to update you on team discussions.  Team conferences with you and your family in attendance may also be held.  Expected length of stay:  14 - 17 days  Overall anticipated outcome:   Supervision  Depending on your progress and recovery, your program may change. Your Social Worker will coordinate services and will keep you informed of any changes. Your Social Worker's name and contact numbers are listed  below.  The following services may also be recommended but are not provided by the [facility] will be made to provide these services after discharge if needed.  Arrangements include referral to agencies that provide these services.  Your insurance has been verified to be:  Medicare and Medicaid Your primary doctor is:  Dr. York Ram  Pertinent information will be shared with your doctor and your insurance company.  Social Worker:  Alfonse Alpers,  LCSW  [phone] or ([phone]  Information discussed with and copy given to patient by: Trey Sailors, 05/27/2014, 2:25 PM

## 2014-05-27 NOTE — Progress Notes (Signed)
[facility] PHYSICAL MEDICINE & REHABILITATION     PROGRESS NOTE    Subjective/Complaints: Had a good day. No problems over night. Mild h/a  Objective: Vital Signs: Blood pressure 127/79, pulse 83, temperature 98.1 F (36.7 C), temperature source Oral, resp. rate 19, weight 88.8 kg (195 lb 12.3 oz), SpO2 96 %. No results found.  Recent Labs  05/25/14 1620 05/26/14 0617  WBC 7.6 7.0  HGB 11.9* 12.2*  HCT 35.2* 36.4*  PLT 202 199    Recent Labs  05/25/14 1620 05/26/14 0617  NA  --  135  K  --  4.2  CL  --  101  GLUCOSE  --  123*  BUN  --  18  CREATININE 0.92 0.77  CALCIUM  --  8.5   CBG (last 3)   Recent Labs  05/24/14 2105 05/25/14 0650 05/25/14 1145  GLUCAP 143* 148* 119*    Wt Readings from Last 3 Encounters:  05/27/14 88.8 kg (195 lb 12.3 oz)  05/07/14 95.7 kg (210 lb 15.7 oz)  08/17/13 93.895 kg (207 lb)    Physical Exam:  HENT: oral mucosa pink, dentition poor Cranioplasty site clean and dry with staples intact  Eyes:  Pupils round and reactive to light without nystagmus  Neck: Normal range of motion. Neck supple. No thyromegaly present.  Cardiovascular: Normal rate and regular rhythm. no murmurs or rubs or gallops Respiratory: Effort normal and breath sounds normal. No respiratory distress. No wheezes GI: Soft. Bowel sounds are normal. He exhibits no distension.  Neurological: no focal CN abnormalities. Tracks to all fields. No tongue or facial deviation Patient is alert and makes good eye contact with examiner. Speaks in broken english.  Patient provides name, age and place. He does follow simple commands. UES: 4/5 delt,tric,bic,wrist, HI. Mild left pronator drift. LES: 3/5 HF, 4- KE and ADF/APF. Withdraws to pain on all 4.  Skin: Skin is warm and dry.  Psych: mood pleasant and cooperative  Assessment/Plan: 1. Functional deficits secondary to meningioma s/p resection/cranioplasty which require 3+ hours per day of interdisciplinary  therapy in a comprehensive inpatient rehab setting. Physiatrist is providing close team supervision and 24 hour management of active medical problems listed below. Physiatrist and rehab team continue to assess barriers to discharge/monitor patient progress toward functional and medical goals. FIM: FIM - Bathing Bathing Steps Patient Completed: Right Arm, Chest, Front perineal area, Buttocks, Right upper leg, Left Arm, Abdomen, Left upper leg Bathing: 4: Min-Patient completes 8-9 45f 10 parts or 75+ percent  FIM - Upper Body Dressing/Undressing Upper body dressing/undressing steps patient completed: Thread/unthread right sleeve of pullover shirt/dresss, Thread/unthread left sleeve of pullover shirt/dress, Put head through opening of pull over shirt/dress Upper body dressing/undressing: 4: Min-Patient completed 75 plus % of tasks FIM - Lower Body Dressing/Undressing Lower body dressing/undressing steps patient completed: Thread/unthread right underwear leg, Thread/unthread right pants leg Lower body dressing/undressing: 2: Max-Patient completed 25-49% of tasks  FIM - Toileting Toileting steps completed by patient: Performs perineal hygiene Toileting Assistive Devices: Grab bar or rail for support Toileting: 2: Max-Patient completed 1 of 3 steps  FIM - Radio producer Devices: Grab bars Toilet Transfers: 3-To toilet/BSC: Mod A (lift or lower assist), 4-From toilet/BSC: Min A (steadying Pt. > 75%)  FIM - Control and instrumentation engineer Devices: Arm rests, Copy: 3: Bed > Chair or W/C: Mod A (lift or lower assist), 3: Chair or W/C > Bed: Mod A (lift or lower assist), 4: Sit >  Supine: Min A (steadying pt. > 75%/lift 1 leg), 4: Supine > Sit: Min A (steadying Pt. > 75%/lift 1 leg)  FIM - Locomotion: Wheelchair Distance: 150 Locomotion: Wheelchair: 2: Travels 50 - 149 ft with minimal assistance (Pt.>75%) FIM - Locomotion:  Ambulation Locomotion: Ambulation Assistive Devices: Administrator Ambulation/Gait Assistance: 3: Mod assist, 1: +2 Total assist (modA x2) Locomotion: Ambulation: 1: Two helpers  Comprehension Comprehension Mode: Auditory Comprehension: 4-Understands basic 75 - 89% of the time/requires cueing 10 - 24% of the time  Expression Expression Mode: Verbal Expression: 5-Expresses basic needs/ideas: With extra time/assistive device  Social Interaction Social Interaction: 4-Interacts appropriately 75 - 89% of the time - Needs redirection for appropriate language or to initiate interaction.  Problem Solving Problem Solving: 3-Solves basic 50 - 74% of the time/requires cueing 25 - 49% of the time  Memory Memory: 3-Recognizes or recalls 50 - 74% of the time/requires cueing 25 - 49% of the time  Medical Problem List and Plan: 1. Functional deficits secondary to bilateral frontoparietal craniectomy for tumor excision of meningioma 05/08/2014 with cranioplasty 05/22/2014 2. DVT Prophylaxis/Anticoagulation: Subcutaneous heparin. Monitor platelet counts of any signs of bleeding 3. Pain Management: Hydrocodone as needed. Monitor with increased mobility 4. Dysphagia. Dysphagia 2 thin liquid diet. Follow-up speech therapy 5. Neuropsych: This patient is capable of making decisions on his own behalf. 6. Skin/Wound Care: Routine skin checks of surgical site--clean 7. Fluids/Electrolytes/Nutrition: Strict I and O in follow-up chemistries normal 8. Seizure prophylaxis. Keppra 500 mg twice a day.  9. Hypothyroidism. Synthroid  LOS (Days) 2 A FACE TO FACE EVALUATION WAS PERFORMED  Geoffrey Neal T 05/27/2014 9:21 AM

## 2014-05-28 ENCOUNTER — Inpatient Hospital Stay (HOSPITAL_COMMUNITY): Payer: Medicare Other

## 2014-05-28 ENCOUNTER — Inpatient Hospital Stay (HOSPITAL_COMMUNITY): Payer: Medicare Other | Admitting: Speech Pathology

## 2014-05-28 ENCOUNTER — Encounter (HOSPITAL_COMMUNITY): Payer: Medicare Other | Admitting: Occupational Therapy

## 2014-05-28 MED ORDER — TOPIRAMATE 25 MG PO TABS
25.0000 mg | ORAL_TABLET | Freq: Every day | ORAL | Status: DC
  Administered 2014-05-28 – 2014-06-04 (×8): 25 mg via ORAL
  Filled 2014-05-28 (×9): qty 1

## 2014-05-28 NOTE — Progress Notes (Signed)
Social Work Patient ID: Geoffrey Neal, male   DOB: 1945/11/19, 69 y.o.   MRN: [id number]   CSW met with pt and his wife, with the assistance of the interpreter, to update them on team conference discussion.  Told pt the expected d/c date and he was disappointed it was two weeks away.  He was hopeful he would leave rehab at the end of this week.  CSW explained to pt a second time that we need to keep him long enough that he will be safe at home and where his wife does not hurt herself helping him.  Pt seemed to understand this.  CSW offered support and assistance with hospital meals.  Interpreter arranged 9-12 daily, except for Sunday.  CSW will continue to follow and assist as needed.

## 2014-05-28 NOTE — Progress Notes (Signed)
Physical Therapy Session Note  Patient Details  Name: Geoffrey Neal MRN: [id number] Date of Birth: 1945-07-02  Today's Date: 05/28/2014 PT Individual Time: 3329-5188 PT Individual Time Calculation (min): 60 min   Short Term Goals: Week 1:  PT Short Term Goal 1 (Week 1): pt will perfrom squat pivot t/f bed<>w/c with min A PT Short Term Goal 2 (Week 1): pt will consistently propel w/c 150' with supervision in controlled environment PT Short Term Goal 3 (Week 1): pt will ambulate 55' with LRAD and mod Ax1person PT Short Term Goal 4 (Week 1): pt will negotiate 12 stairs with 1 rail and minA PT Short Term Goal 5 (Week 1): Pt will maintain dynamic standing balance during functional task with min Ax1person  Skilled Therapeutic Interventions/Progress Updates:  1:1. Pt received supine in bed with bed alarm on. Interpretor present throughout session. Session focused on w/c propulsion, ambulation, NMR, sustained attention to task and functional transfers. MinA for supine>sit, modA for bed<>w/c and minA for toilet transfers this session. Pt propelled w/c 75'x2 with min A and mod cues for attention to task and mild L inattention. Student PT helped pt donn posterior carbon-fiber AFO to address absent LLE dorsiflexion during trial ambulation of 30' with modA, RW, however pt required max multimodal cues for focused>sustained attention in quiet gym environment to generate intermittent reciprocal movement with consistent cues to advance L LE and increased gait speed. With use of AFO noted increased stability and L foot clearance. Pt used NuStep for 10 minutes to address gross deficits including decreased initiation for LE reciprocal pattern, decreased speed of gross movement and decreased focused>sustained attention. Pt required overall total verbal/tactile cue to generate reciprocal pattern +/- UE support.  Pt returned to room after reporting need to use the bathroom and transferred to/from commode. Left sitting in  w/c with SLP, wife and interpretor in room.  Therapy Documentation Precautions:  Precautions Precautions: Fall Restrictions Weight Bearing Restrictions: No Pain: Pain Assessment Pain Assessment: 0-10 Pain Score: 0-No pain  See FIM for current functional status  Therapy/Group: Individual Therapy  Carlos Levering 05/28/2014, 12:13 PM

## 2014-05-28 NOTE — Progress Notes (Signed)
Speech Language Pathology Daily Session Note  Patient Details  Name: Geoffrey Neal MRN: [id number] Date of Birth: [birth year]  Today's Date: 05/28/2014 SLP Individual Time: 7169-6789 SLP Individual Time Calculation (min): 60 min  Short Term Goals: Week 1: SLP Short Term Goal 1 (Week 1): Patient will demonstrate functional problem solving during basic and familiar tasks with Min A multimodal cues.  SLP Short Term Goal 2 (Week 1): Patient will recall new, daily information with Min A multimodal cues.  SLP Short Term Goal 3 (Week 1): Patient will identify 2 physical and 2 cognitive deficits with Min A multimodal cues.  SLP Short Term Goal 4 (Week 1): Patient will demonstrate selective attention in a mildly distracting enviornment for 60 minutes with Min A multimodal cues for redirection.  SLP Short Term Goal 5 (Week 1): Patient will utilize call bell to request assistance with Min A multimodal cues.   Skilled Therapeutic Interventions: Skilled treatment session with wife and translator present focused on patient and family education and cognitive goals. Student facilitated session by providing education about current medications and their functions and patient required Max-Total A multimodal cues for delayed recall of information after ~5 minutes. Student also facilitated session by providing Max-Total A multimodal cues for problem solving and organization during a mildly complex task involving use of external aid with a 2 time daily pill box. Patient left in room in wheelchair with wife and translator present. Continue with current plan of care.   FIM:  Comprehension Comprehension Mode: Auditory Comprehension: 2-Understands basic 25 - 49% of the time/requires cueing 51 - 75% of the time Expression Expression Mode: Verbal Expression: 3-Expresses basic 50 - 74% of the time/requires cueing 25 - 50% of the time. Needs to repeat parts of sentences. Social Interaction Social Interaction: 2-Interacts  appropriately 25 - 49% of time - Needs frequent redirection. Problem Solving Problem Solving: 1-Solves basic less than 25% of the time - needs direction nearly all the time or does not effectively solve problems and may need a restraint for safety Memory Memory: 1-Recognizes or recalls less than 25% of the time/requires cueing greater than 75% of the time  Pain Pain Assessment Pain Assessment: No/denies pain Pain Score: 0-No pain  Therapy/Group: Individual Therapy  Servando Snare 05/28/2014, 12:56 PM

## 2014-05-28 NOTE — Progress Notes (Signed)
Occupational Therapy Session Note  Patient Details  Name: Geoffrey Neal MRN: [id number] Date of Birth: 07/06/1945  Today's Date: 05/28/2014 OT Individual Time: 1100-1200 OT Individual Time Calculation (min): 60 min    Short Term Goals: Week 1:  OT Short Term Goal 1 (Week 1): Pt will complete LB dressing with min A OT Short Term Goal 2 (Week 1): Pt will completed self-care task in standing for 1 min with min A for balance OT Short Term Goal 3 (Week 1): Pt will complete functional transfer with min A and min cues for positioning of LLE OT Short Term Goal 4 (Week 1): Pt will complete bathing with min A and min cues for initiation and sequencing  Skilled Therapeutic Interventions/Progress Updates:    1:1 self care retraining at shower level. Focus on stand pivot transfer w/c<>shower chair in shower. Focus on sit to stand with attention to left LE, standing balance, visual and body awareness of left LE, initiation, activity tolerance, balance reactions. Pt transferred in to shower on shower chair with min A with max tactile cues for awareness of Left LE and to put weight through it with transfer. Pt continued to require max verbal and tactile cues for proper placement of left LE in prep for sit to stand and in standing. Education provided on hemi dressing technique of dressing left LE first.  Required extra time and encouragement to perform. Pt wanted wife to perform for him and wife wanted to help.  Provided education to both about the purpose of OT and goals addressing in therapy prior to d/c.  Towards the end of session pt required way more than reasonable time - 20 min to don socks and shoes.  decr initiation to complete task and attempt to perform on his left. Provided backwards chaining for cues for initiation. Pt left with wife at end of session in w.c with quick release on.   Interpreter present for session.   Therapy Documentation Precautions:  Precautions Precautions:  Fall Restrictions Weight Bearing Restrictions: No Pain: Pain Assessment Pain Assessment: No/denies pain Pain Score: 0-No pain  See FIM for current functional status  Therapy/Group: Individual Therapy  Willeen Cass [facility] 05/28/2014, 12:48 PM

## 2014-05-28 NOTE — Progress Notes (Signed)
Geoffrey Neal PHYSICAL MEDICINE & REHABILITATION     PROGRESS NOTE    Subjective/Complaints: Having more headaches. Didn't ask nurse for any medication  Objective: Vital Signs: Blood pressure 122/73, pulse 75, temperature 98.6 F (37 C), temperature source Oral, resp. rate 18, height 5\' 7"  (1.702 m), weight 88.8 kg (195 lb 12.3 oz), SpO2 99 %. No results found.  Recent Labs  05/25/14 1620 05/26/14 0617  WBC 7.6 7.0  HGB 11.9* 12.2*  HCT 35.2* 36.4*  PLT 202 199    Recent Labs  05/25/14 1620 05/26/14 0617  NA  --  135  K  --  4.2  CL  --  101  GLUCOSE  --  123*  BUN  --  18  CREATININE 0.92 0.77  CALCIUM  --  8.5   CBG (last 3)   Recent Labs  05/25/14 1145  GLUCAP 119*    Wt Readings from Last 3 Encounters:  05/27/14 88.8 kg (195 lb 12.3 oz)  05/07/14 95.7 kg (210 lb 15.7 oz)  08/17/13 93.895 kg (207 lb)    Physical Exam:  HENT: oral mucosa pink, dentition poor Cranioplasty site clean and dry with staples intact  Eyes:  Pupils round and reactive to light without nystagmus  Neck: Normal range of motion. Neck supple. No thyromegaly present.  Cardiovascular: Normal rate and regular rhythm. no murmurs or rubs or gallops Respiratory: Effort normal and breath sounds normal. No respiratory distress. No wheezes GI: Soft. Bowel sounds are normal. He exhibits no distension.  Neurological: no focal CN abnormalities. Tracks to all fields. No tongue or facial deviation Patient is alert and makes good eye contact with examiner. Speaks in broken english.  Patient provides name, age and place. He does follow simple commands. UES: 4/5 delt,tric,bic,wrist, HI. Mild left pronator drift. LES: 3/5 HF, 4- KE and ADF/APF. Withdraws to pain on all 4.  Skin: Skin is warm and dry.  Psych: mood pleasant and cooperative  Assessment/Plan: 1. Functional deficits secondary to meningioma s/p resection/cranioplasty which require 3+ hours per day of interdisciplinary therapy in a  comprehensive inpatient rehab setting. Physiatrist is providing close team supervision and 24 hour management of active medical problems listed below. Physiatrist and rehab team continue to assess barriers to discharge/monitor patient progress toward functional and medical goals. FIM: FIM - Bathing Bathing Steps Patient Completed: Right Arm, Chest, Front perineal area, Buttocks, Right upper leg, Left Arm, Abdomen, Left upper leg Bathing: 4: Min-Patient completes 8-9 20f 10 parts or 75+ percent  FIM - Upper Body Dressing/Undressing Upper body dressing/undressing steps patient completed: Thread/unthread right sleeve of pullover shirt/dresss, Thread/unthread left sleeve of pullover shirt/dress, Put head through opening of pull over shirt/dress, Pull shirt over trunk Upper body dressing/undressing: 5: Supervision: Safety issues/verbal cues FIM - Lower Body Dressing/Undressing Lower body dressing/undressing steps patient completed: Thread/unthread right underwear leg, Thread/unthread right pants leg Lower body dressing/undressing: 2: Max-Patient completed 25-49% of tasks  FIM - Toileting Toileting steps completed by patient: Adjust clothing prior to toileting, Adjust clothing after toileting, Performs perineal hygiene Toileting Assistive Devices: Grab bar or rail for support Toileting: 4: Steadying assist  FIM - Radio producer Devices: Grab bars Toilet Transfers: 4-To toilet/BSC: Min A (steadying Pt. > 75%), 4-From toilet/BSC: Min A (steadying Pt. > 75%)  FIM - Bed/Chair Transfer Bed/Chair Transfer Assistive Devices: Arm rests, Walker, Bed rails Bed/Chair Transfer: 3: Bed > Chair or W/C: Mod A (lift or lower assist), 3: Chair or W/C > Bed: Mod A (  lift or lower assist)  FIM - Locomotion: Wheelchair Distance: 100 Locomotion: Wheelchair: 2: Travels 50 - 149 ft with minimal assistance (Pt.>75%) FIM - Locomotion: Ambulation Locomotion: Ambulation Assistive Devices:  Walker - Rolling (LLE ACE wrap) Ambulation/Gait Assistance: 3: Mod assist Locomotion: Ambulation: 1: Travels less than 50 ft with moderate assistance (Pt: 50 - 74%)  Comprehension Comprehension Mode: Auditory Comprehension: 4-Understands basic 75 - 89% of the time/requires cueing 10 - 24% of the time  Expression Expression Mode: Verbal Expression: 5-Expresses basic needs/ideas: With extra time/assistive device  Social Interaction Social Interaction: 5-Interacts appropriately 90% of the time - Needs monitoring or encouragement for participation or interaction.  Problem Solving Problem Solving: 4-Solves basic 75 - 89% of the time/requires cueing 10 - 24% of the time  Memory Memory: 4-Recognizes or recalls 75 - 89% of the time/requires cueing 10 - 24% of the time  Medical Problem List and Plan: 1. Functional deficits secondary to bilateral frontoparietal craniectomy for tumor excision of meningioma 05/08/2014 with cranioplasty 05/22/2014 2. DVT Prophylaxis/Anticoagulation: Subcutaneous heparin. Monitor platelet counts of any signs of bleeding 3. Pain Management: Hydrocodone as needed. Monitor with increased mobility 4. Dysphagia. Dysphagia 2 thin liquid diet. Follow-up speech therapy 5. Neuropsych: This patient is capable of making decisions on his own behalf. 6. Skin/Wound Care: skin intact 7. Fluids/Electrolytes/Nutrition: Strict I and O in follow-up chemistries normal 8. Seizure prophylaxis. Keppra 500 mg twice a day.  9. Hypothyroidism. Synthroid  LOS (Days) 3 A FACE TO FACE EVALUATION WAS PERFORMED  Kimaya Whitlatch T 05/28/2014 9:25 AM

## 2014-05-28 NOTE — Progress Notes (Signed)
Social Work Patient ID: Geoffrey Neal, male   DOB: 1945/08/10, 69 y.o.   MRN: [id number]   Geoffrey Mesi Falisa Lamora, LCSW Social Worker Signed  Patient Care Conference 05/28/2014 12:24 PM    Expand All Collapse All   Inpatient RehabilitationTeam Conference and Plan of Care Update Date: 05/26/2014   Time: 2:05 PM     Patient Name: Geoffrey Neal       Medical Record Number: [id number]  Date of Birth: 07-14-1945 Sex: Male         Room/Bed: 4W20C/4W20C-01 Payor Info: Payor: MEDICARE / Plan: MEDICARE PART A AND B / Product Type: *No Product type* /    Admitting Diagnosis: TUMOR RESECTION AND CRANIOPLASTY  Admit Date/Time:  05/25/2014  3:00 PM Admission Comments: No comment available   Primary Diagnosis:  <principal problem not specified> Principal Problem: <principal problem not specified>    Patient Active Problem List     Diagnosis  Date Noted   .  Meningioma     .  Brain tumor  05/07/2014   .  CONSTIPATION  11/16/2009   .  HYPERLIPIDEMIA, MIXED  09/29/2008   .  HYPERGLYCEMIA  09/29/2008   .  ROSACEA  04/02/2008   .  GERD  02/18/2007   .  VITILIGO  02/11/2007   .  HYPOTHYROIDISM  02/04/2007   .  DEGENERATIVE JOINT DISEASE  02/04/2007   .  ARTHRALGIA  02/04/2007     Expected Discharge Date: Expected Discharge Date: 06/11/14  Team Members Present: Physician leading conference: Dr. Alger Simons Social Worker Present: Lennart Pall, LCSW Nurse Present: Elliot Cousin, RN PT Present: Melene Plan, Cottie Banda, PT OT Present: Roanna Epley, Griffin Basil, OT SLP Present: Weston Anna, SLP PPS Coordinator present : Daiva Nakayama, RN, CRRN        Current Status/Progress  Goal  Weekly Team Focus   Medical     mengioma s/p resection large cranioplasty, wound issues. pain, h/a  improve activity tolerance  improve pain and initiation   Bowel/Bladder     Continent of bowel and bladder; LBM 1/24; foley removed 2/1 1430   Mod I  Assess for urinary retention and constipation and  treat appropriately   Swallow/Nutrition/ Hydration     D2 thin liquid diet         ADL's     max A LB dressing, min-mod A transfers and bathing   supervision overall   functional transfers, standing balance, safety awarenss, awareness of LLE, education, motor planning    Mobility     Pt is curretly at supervision for bed mobility and sitting balance, modA for standing balance,transfers, and stair negotiation; mod Ax2-persons for ambulation with RW  Supervision for all functional transfers and mobility   Functional endurance, strength, motor initiation, standing balance, ambulation, transfers, stair negotiation, safety awareness and pt/family education.   Communication     Primarily spanish speaking         Safety/Cognition/ Behavioral Observations    Mod A  Supervision  functional problem solving, awareness and working memory     Pain     C/o headache but refuses pain meds   < 4  Assess and treat for pain q shift and prn   Skin     Incision to head CDI with staples; no skin breakdown or infection noted  Skin will remain free from infection or breakdown with mod assist   Assess skin q shift and prn    Rehab Goals Patient on target to  meet rehab goals: Yes Rehab Goals Revised: none as this is pt's first conference *See Care Plan and progress notes for long and short-term goals.    Barriers to Discharge:  awareness, initiation of LLE     Possible Resolutions to Barriers:   continued NMR     Discharge Planning/Teaching Needs:   Pt plans to return home with his wife present for 24/7 supervision.  Wife is present for every therapy session and will be willing to receive family education as needed.      Team Discussion:    Pt had some difficulty with basic math and he managed the family's finances.  Team wonders what pt's level of awareness is of his cognitive deficits.  Pt is currently min/mod with transfers due to motor planning deficits, but goals are supervision.  Pt has poor  initiation of left lower extremity.  Currently pt is still having in and out caths, but team hopes this will resolve by d/c.   Revisions to Treatment Plan:    none    Continued Need for Acute Rehabilitation Level of Care: The patient requires daily medical management by a physician with specialized training in physical medicine and rehabilitation for the following conditions: Daily direction of a multidisciplinary physical rehabilitation program to ensure safe treatment while eliciting the highest outcome that is of practical value to the patient.: Yes Daily medical management of patient stability for increased activity during participation in an intensive rehabilitation regime.: Yes Daily analysis of laboratory values and/or radiology reports with any subsequent need for medication adjustment of medical intervention for : Neurological problems;Post surgical problems  [name], Geoffrey Mesi 05/28/2014, 12:24 PM

## 2014-05-28 NOTE — Patient Care Conference (Signed)
Inpatient RehabilitationTeam Conference and Plan of Care Update Date: 05/26/2014   Time: 2:05 PM    Patient Name: Geoffrey Neal      Medical Record Number: [id number]  Date of Birth: [birth year] Sex: Male         Room/Bed: 4W20C/4W20C-01 Payor Info: Payor: MEDICARE / Plan: MEDICARE PART A AND B / Product Type: *No Product type* /    Admitting Diagnosis: TUMOR RESECTION AND CRANIOPLASTY  Admit Date/Time:  05/25/2014  3:00 PM Admission Comments: No comment available   Primary Diagnosis:  <principal problem not specified> Principal Problem: <principal problem not specified>  Patient Active Problem List   Diagnosis Date Noted  . Meningioma   . Brain tumor 05/07/2014  . CONSTIPATION 11/16/2009  . 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 HYPERGLYCEMIA 09/29/2008  . 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 GERD 02/18/2007  . VITILIGO 02/11/2007  . HYPOTHYROIDISM 02/04/2007  . DEGENERATIVE JOINT DISEASE 02/04/2007  . ARTHRALGIA 02/04/2007    Expected Discharge Date: Expected Discharge Date: 06/11/14  Team Members Present: Physician leading conference: Dr. Alger Simons Social Worker Present: Lennart Pall, LCSW Nurse Present: Elliot Cousin, RN PT Present: Melene Plan, Cottie Banda, PT OT Present: Roanna Epley, Griffin Basil, OT SLP Present: Weston Anna, SLP PPS Coordinator present : Daiva Nakayama, RN, CRRN     Current Status/Progress Goal Weekly Team Focus  Medical   mengioma s/p resection large cranioplasty, wound issues. pain, h/a  improve activity tolerance  improve pain and initiation   Bowel/Bladder   Continent of bowel and bladder; LBM 1/24; foley removed 2/1 1430  Mod I  Assess for urinary retention and constipation and treat appropriately   Swallow/Nutrition/ Hydration   D2 thin liquid diet         ADL's   max A LB dressing, min-mod A transfers and bathing  supervision overall   functional transfers, standing balance, safety awarenss, awareness of LLE, education, motor planning     Mobility   Pt is curretly at supervision for bed mobility and sitting balance, modA for standing balance,transfers, and stair negotiation; mod Ax2-persons for ambulation with RW  Supervision for all functional transfers and mobility  Functional endurance, strength, motor initiation, standing balance, ambulation, transfers, stair negotiation, safety awareness and pt/family education.   Communication   Primarily spanish speaking         Safety/Cognition/ Behavioral Observations  Mod A  Supervision  functional problem solving, awareness and working memory    Pain   C/o headache but refuses pain meds  < 4  Assess and treat for pain q shift and prn   Skin   Incision to head CDI with staples; no skin breakdown or infection noted  Skin will remain free from infection or breakdown with mod assist  Assess skin q shift and prn    Rehab Goals Patient on target to meet rehab goals: Yes Rehab Goals Revised: none as this is pt's first conference *See Care Plan and progress notes for long and short-term goals.  Barriers to Discharge: awareness, initiation of LLE    Possible Resolutions to Barriers:  continued NMR    Discharge Planning/Teaching Needs:  Pt plans to return home with his wife present for 24/7 supervision.  Wife is present for every therapy session and will be willing to receive family education as needed.     Team Discussion:  Pt had some difficulty with basic math and he managed the family's finances.  Team wonders what pt's level of awareness is of his cognitive deficits.  Pt is currently min/mod with transfers due to motor planning deficits, but goals are supervision.  Pt has poor initiation of left lower extremity.  Currently pt is still having in and out caths, but team hopes this will resolve by d/c.  Revisions to Treatment Plan:  none   Continued Need for Acute Rehabilitation Level of Care: The patient requires daily medical management by a physician with specialized training  in physical medicine and rehabilitation for the following conditions: Daily direction of a multidisciplinary physical rehabilitation program to ensure safe treatment while eliciting the highest outcome that is of practical value to the patient.: Yes Daily medical management of patient stability for increased activity during participation in an intensive rehabilitation regime.: Yes Daily analysis of laboratory values and/or radiology reports with any subsequent need for medication adjustment of medical intervention for : Neurological problems;Post surgical problems  Moe Brier, Silvestre Mesi 05/28/2014, 12:24 PM

## 2014-05-28 NOTE — IPOC Note (Signed)
Overall Plan of Care [facility]) Patient Details Name: Geoffrey Neal MRN: [id number] DOB: February 25, 1946  Admitting Diagnosis: TUMOR RESECTION AND [place] [facility] Problems: Active Problems:   Meningioma     Functional Problem List: Nursing Bladder, Bowel, Endurance, Medication Management, Motor, Nutrition, Pain, Safety, Skin Integrity  PT Balance, Endurance, Motor, Perception, Pain, Safety, Skin Integrity, Sensory, Other (comment) (Strength)  OT Balance, Cognition, Endurance, Motor, Sensory, Safety, Perception, Vision  SLP Cognition  TR         Basic ADL's: OT Grooming, Bathing, Dressing, Toileting, Eating     Advanced  ADL's: OT       Transfers: PT Bed Mobility, Bed to Chair, Car, [employer], Futures trader, Metallurgist: PT Ambulation, Emergency planning/management officer, Stairs     Additional Impairments: OT Fuctional Use of Upper Extremity (L hemiplegia)  SLP Social Cognition   Problem Solving, Memory, Attention, Social Interaction, Awareness  TR      Anticipated Outcomes Item Anticipated Outcome  Self Feeding supervision  Swallowing      Basic self-care  supervision   Toileting  supervision    Bathroom Transfers supervision   Bowel/Bladder  Min assist  Transfers  Supervision  Locomotion  Supervision-Min A  Communication     Cognition  Supervision   Pain  < 3  Safety/Judgment  Supervision   Therapy Plan: PT Intensity: Minimum of 1-2 x/day ,45 to 90 minutes PT Frequency: 5 out of 7 days PT Duration Estimated Length of Stay: 14-17days OT Intensity: Minimum of 1-2 x/day, 45 to 90 minutes OT Frequency: 5 out of 7 days OT Duration/Estimated Length of Stay: 14-18 days SLP Intensity: Minumum of 1-2 x/day, 30 to 90 minutes SLP Frequency: 5 out of 7 days SLP Duration/Estimated Length of Stay: 2-2.5 weeks       Team Interventions: Nursing Interventions Patient/Family Education, Bladder Management, Bowel Management, Disease Management/Prevention,  Pain Management, Medication Management, Skin Care/Wound Management, Cognitive Remediation/Compensation, Dysphagia/Aspiration Precaution Training, Discharge Planning  PT interventions Ambulation/gait training, Balance/vestibular training, Cognitive remediation/compensation, Discharge planning, Disease management/prevention, DME/adaptive equipment instruction, Patient/family education, Pain management, Neuromuscular re-education, Functional mobility training, Psychosocial support, Stair training, UE/LE Coordination activities, UE/LE Strength taining/ROM, Therapeutic Exercise, Therapeutic Activities, Skin care/wound management, Wheelchair propulsion/positioning, Visual/perceptual remediation/compensation, Splinting/orthotics  OT Interventions Balance/vestibular training, Cognitive remediation/compensation, Discharge planning, Community reintegration, Functional mobility training, DME/adaptive equipment instruction, Neuromuscular re-education, Functional electrical stimulation, Psychosocial support, Patient/family education, Self Care/advanced ADL retraining, Therapeutic Activities, Splinting/orthotics, Therapeutic Exercise, UE/LE Strength taining/ROM, UE/LE Coordination activities, Visual/perceptual remediation/compensation, Wheelchair propulsion/positioning  SLP Interventions Cognitive remediation/compensation, Cueing hierarchy, Functional tasks, Environmental controls, Internal/external aids, Speech/Language facilitation, Patient/family education, Therapeutic Activities  TR Interventions    SW/CM Interventions Discharge Planning, Psychosocial Support, Patient/Family Education    Team Discharge Planning: Destination: PT-Home ,OT- Home , SLP-Home Projected Follow-up: PT-Home health PT, OT-  Home health OT, SLP-24 hour supervision/assistance, Outpatient SLP, Home Health SLP Projected Equipment Needs: PT-To be determined, OT- To be determined, SLP-None recommended by SLP Equipment Details: PT- , OT-   Patient/family involved in discharge planning: PT- Patient, Family member/caregiver,  OT-Patient, Family member/caregiver, SLP-Patient, Family member/caregiver  MD ELOS: 14-17 days Medical Rehab Prognosis:  Excellent Assessment: The patient has been admitted for CIR therapies with the diagnosis of mengioma s/p resection and major cranioplasty. The team will be addressing functional mobility, strength, stamina, balance, safety, adaptive techniques and equipment, self-care, bowel and bladder mgt, patient and caregiver education, NMR, pain mgt, visual-spatial and cognitive perceptual awareness, community reintegration, family ed. Goals have been  set at supervision for most mobility and self-care tasks, cognition----min assist for locomotion.    Meredith Staggers, MD, FAAPMR      See Team Conference Notes for weekly updates to the plan of care

## 2014-05-29 ENCOUNTER — Inpatient Hospital Stay (HOSPITAL_COMMUNITY): Payer: Medicare Other

## 2014-05-29 ENCOUNTER — Inpatient Hospital Stay (HOSPITAL_COMMUNITY): Payer: Medicare Other | Admitting: Speech Pathology

## 2014-05-29 ENCOUNTER — Inpatient Hospital Stay (HOSPITAL_COMMUNITY): Payer: Medicare Other | Admitting: *Deleted

## 2014-05-29 DIAGNOSIS — R339 Retention of urine, unspecified: Secondary | ICD-10-CM

## 2014-05-29 LAB — URINALYSIS, ROUTINE W REFLEX MICROSCOPIC
Bilirubin Urine: NEGATIVE
Glucose, UA: NEGATIVE mg/dL
HGB URINE DIPSTICK: NEGATIVE
Ketones, ur: NEGATIVE mg/dL
NITRITE: POSITIVE — AB
Protein, ur: NEGATIVE mg/dL
SPECIFIC GRAVITY, URINE: 1.023 (ref 1.005–1.030)
Urobilinogen, UA: 1 mg/dL (ref 0.0–1.0)
pH: 5 (ref 5.0–8.0)

## 2014-05-29 LAB — URINE MICROSCOPIC-ADD ON

## 2014-05-29 MED ORDER — BISACODYL 10 MG RE SUPP
10.0000 mg | Freq: Every day | RECTAL | Status: DC | PRN
  Administered 2014-05-29: 10 mg via RECTAL
  Filled 2014-05-29: qty 1

## 2014-05-29 NOTE — Progress Notes (Signed)
Nursing Note: Pt stood to void w/ staff assist.Took water running and  commode flushing repeatedly for pt to void.Voided 200cc.Pvr 400 cc w/ bladder scan but only in and out cathed for 75 cc.Pt tolerated well.wbb

## 2014-05-29 NOTE — Progress Notes (Addendum)
Physical Therapy Session Note  Patient Details  Name: Geoffrey Neal MRN: [id number] Date of Birth: Apr 10, 1946  Today's Date: 05/29/2014 PT Individual Time: 0900-1000 PT Individual Time Calculation (min): 60 min   Short Term Goals: Week 1:  PT Short Term Goal 1 (Week 1): pt will perfrom squat pivot t/f bed<>w/c with min A PT Short Term Goal 2 (Week 1): pt will consistently propel w/c 150' with supervision in controlled environment PT Short Term Goal 3 (Week 1): pt will ambulate 55' with LRAD and mod Ax1person PT Short Term Goal 4 (Week 1): pt will negotiate 12 stairs with 1 rail and minA PT Short Term Goal 5 (Week 1): Pt will maintain dynamic standing balance during functional task with min Ax1person  Skilled Therapeutic Interventions/Progress Updates:  1:1. Pt received semi-reclined in bed, ready for therapy. Interpreter present throughout session. Focus this session on functional endurance, sustained>selective attention, emergent awareness, initiation, motor planning, coordination, gait training and safety during functional transfers and mobility. Pt able to t/f sup>sit EOB with supervision, squat pivot t/f bed>w/c<>NuStep with min guard A, min A for toilet transfer as well as multiple t/f sit<>stand with consistent verbal/tacitle cues for hand placement. Pt req overall steadying assist for toileting. Pt performed brief light grooming at sink. Pt frequently requesting assistance from wife for completion of basic tasks at start of session, both req consistent redirection regarding purpose of therapy/task for increased functional independence. Strong education this session on pt's current impairments, role of therapies and goals for physical therapy. Both pt and wife verbalized understanding.   Pt able to propel w/c 175'x1 with B UE and supervision, intermittent min cues for redirection to task in busy hallway environment. Pt consistently deviating to L due to distractibility and decreased L UE  strength.    Pt with significantly improved ability to generate reciprocal gait pattern today, req min A with overall consistent min-mod verbal/tactile cues for sustained>selective in mildly busy hallway for consistent advancement of L LE, increased L step length and increased BOS due to intermittent scissoring.     Pt utilized NuStep to target B LE strength, reciprocal coordination and sustained attention to task, excellent tolerance and sustained attention to level 4x42min with variable pace between 40-60s.   Pt demonstrating significantly improved sustained attention to functional tasks this session with initial mod A decreasing to intermittent min cues.   Pt left sitting in w/c at end of session with all needs in reach and quick release belt in place, wife and interpreter present.    Therapy Documentation Precautions:  Precautions Precautions: Fall Restrictions Weight Bearing Restrictions: No Pain: Pain Assessment Pain Assessment: No/denies pain Pain Score: 0-No pain  See FIM for current functional status  Therapy/Group: Individual Therapy  Geoffrey Neal 05/29/2014, 12:31 PM

## 2014-05-29 NOTE — Plan of Care (Signed)
Problem: RH BOWEL ELIMINATION Goal: RH STG MANAGE BOWEL WITH ASSISTANCE STG Manage Bowel with mod I Assistance.  Outcome: Not Progressing Had small BM yesterday following sorbitol, but feels abdominal discomfort

## 2014-05-29 NOTE — Progress Notes (Signed)
Speech Language Pathology Daily Session Note  Patient Details  Name: Geoffrey Neal MRN: [id number] Date of Birth: [birth year]  Today's Date: 05/29/2014 SLP Individual Time: 1005-1105 SLP Individual Time Calculation (min): 60 min  Short Term Goals: Week 1: SLP Short Term Goal 1 (Week 1): Patient will demonstrate functional problem solving during basic and familiar tasks with Min A multimodal cues.  SLP Short Term Goal 2 (Week 1): Patient will recall new, daily information with Min A multimodal cues.  SLP Short Term Goal 3 (Week 1): Patient will identify 2 physical and 2 cognitive deficits with Min A multimodal cues.  SLP Short Term Goal 4 (Week 1): Patient will demonstrate selective attention in a mildly distracting enviornment for 60 minutes with Min A multimodal cues for redirection.  SLP Short Term Goal 5 (Week 1): Patient will utilize call bell to request assistance with Min A multimodal cues.   Skilled Therapeutic Interventions: Skilled treatment session focused on cognitive goals. Upon arrival, patient was sitting upright in the wheelchair and was agreeable to participate in treatment session. SLP facilitated session by initially providing Mod A multimodal cues which faded to Mod I by end of task for functional problem solving and organization during a mildly complex medication management task of organizing a 2 time per day pill box. Patient also required extra time and Mod-Max A multimodal cues for functional problem solving during a mildly complex money management task of balancing a checkbook. Overall, patient's cognitive function appeared improved today, however, patient continues to demonstrate decreased emergent awareness of impairments. Patient left in wheelchair with wife and interpreter present. Continue with current plan of care.    FIM:  Comprehension Comprehension Mode: Auditory Comprehension: 4-Understands basic 75 - 89% of the time/requires cueing 10 - 24% of the  time Expression Expression Mode: Verbal Expression: 4-Expresses basic 75 - 89% of the time/requires cueing 10 - 24% of the time. Needs helper to occlude trach/needs to repeat words. Social Interaction Social Interaction: 5-Interacts appropriately 90% of the time - Needs monitoring or encouragement for participation or interaction. Problem Solving Problem Solving: 5-Solves basic 90% of the time/requires cueing < 10% of the time Memory Memory: 4-Recognizes or recalls 75 - 89% of the time/requires cueing 10 - 24% of the time FIM - Eating Eating Activity: 7: Complete independence:no helper  Pain Pain Assessment Pain Assessment: No/denies pain  Therapy/Group: Individual Therapy  Geoffrey Neal 05/29/2014, 4:23 PM

## 2014-05-29 NOTE — Progress Notes (Signed)
Patient states he "feels better" since he has had a BM, after suppository and soap suds enema.  He says he "think I'm good now."  Marinus Maw, Juanetta Beets, RN

## 2014-05-29 NOTE — Progress Notes (Signed)
[facility] PHYSICAL MEDICINE & REHABILITATION     PROGRESS NOTE    Subjective/Complaints: Headache a little better last night. Was cathed for 300 and 75 cc yesterday  Objective: Vital Signs: Blood pressure 92/57, pulse 81, temperature 99 F (37.2 C), temperature source Oral, resp. rate 19, height 5\' 7"  (1.702 m), weight 88.6 kg (195 lb 5.2 oz), SpO2 97 %. No results found. No results for input(s): WBC, HGB, HCT, PLT in the last 72 hours. No results for input(s): NA, K, CL, GLUCOSE, BUN, CREATININE, CALCIUM in the last 72 hours.  Invalid input(s): CO CBG (last 3)  No results for input(s): GLUCAP in the last 72 hours.  Wt Readings from Last 3 Encounters:  05/29/14 88.6 kg (195 lb 5.2 oz)  05/07/14 95.7 kg (210 lb 15.7 oz)  08/17/13 93.895 kg (207 lb)    Physical Exam:  HENT: oral mucosa pink, dentition poor Cranioplasty site clean and dry with staples intact  Eyes:  Pupils round and reactive to light without nystagmus  Neck: Normal range of motion. Neck supple. No thyromegaly present.  Cardiovascular: Normal rate and regular rhythm. no murmurs or rubs or gallops Respiratory: Effort normal and breath sounds normal. No respiratory distress. No wheezes GI: Soft. Bowel sounds are normal. He exhibits no distension.  Neurological: no focal CN abnormalities. Tracks to all fields. No tongue or facial deviation Patient is alert and makes good eye contact with examiner. He does follow simple commands. UES: 4/5 delt,tric,bic,wrist, HI. Mild left pronator drift. LES: 3+/5 HF, 4- KE and ADF/APF. Senses pain and light touch in all 4.  Skin: Skin is warm and dry.  Psych: mood pleasant and cooperative  Assessment/Plan: 1. Functional deficits secondary to meningioma s/p resection/cranioplasty which require 3+ hours per day of interdisciplinary therapy in a comprehensive inpatient rehab setting. Physiatrist is providing close team supervision and 24 hour management of active medical  problems listed below. Physiatrist and rehab team continue to assess barriers to discharge/monitor patient progress toward functional and medical goals. FIM: FIM - Bathing Bathing Steps Patient Completed: Chest, Right Arm, Left Arm, Abdomen, Front perineal area, Right upper leg, Left upper leg, Right lower leg (including foot) Bathing: 4: Min-Patient completes 8-9 73f 10 parts or 75+ percent  FIM - Upper Body Dressing/Undressing Upper body dressing/undressing steps patient completed: Thread/unthread right sleeve of pullover shirt/dresss, Thread/unthread left sleeve of pullover shirt/dress, Put head through opening of pull over shirt/dress, Pull shirt over trunk Upper body dressing/undressing: 5: Supervision: Safety issues/verbal cues FIM - Lower Body Dressing/Undressing Lower body dressing/undressing steps patient completed: Thread/unthread right underwear leg, Pull underwear up/down, Thread/unthread right pants leg, Don/Doff right shoe, Fasten/unfasten right shoe, Fasten/unfasten left shoe Lower body dressing/undressing: 3: Mod-Patient completed 50-74% of tasks  FIM - Toileting Toileting steps completed by patient: Performs perineal hygiene Toileting Assistive Devices: Grab bar or rail for support Toileting: 2: Max-Patient completed 1 of 3 steps  FIM - Radio producer Devices: Grab bars Toilet Transfers: 4-To toilet/BSC: Min A (steadying Pt. > 75%), 4-From toilet/BSC: Min A (steadying Pt. > 75%)  FIM - Bed/Chair Transfer Bed/Chair Transfer Assistive Devices: Arm rests, Walker, Bed rails Bed/Chair Transfer: 3: Bed > Chair or W/C: Mod A (lift or lower assist), 3: Chair or W/C > Bed: Mod A (lift or lower assist), 4: Supine > Sit: Min A (steadying Pt. > 75%/lift 1 leg)  FIM - Locomotion: Wheelchair Distance: 75 Locomotion: Wheelchair: 2: Travels 50 - 149 ft with minimal assistance (Pt.>75%) FIM -  Locomotion: Ambulation Locomotion: Ambulation Assistive Devices:  Administrator Ambulation/Gait Assistance: 3: Mod assist Locomotion: Ambulation: 1: Travels less than 50 ft with moderate assistance (Pt: 50 - 74%)  Comprehension Comprehension Mode: Auditory Comprehension: 2-Understands basic 25 - 49% of the time/requires cueing 51 - 75% of the time  Expression Expression Mode: Verbal Expression: 3-Expresses basic 50 - 74% of the time/requires cueing 25 - 50% of the time. Needs to repeat parts of sentences.  Social Interaction Social Interaction: 2-Interacts appropriately 25 - 49% of time - Needs frequent redirection.  Problem Solving Problem Solving: 1-Solves basic less than 25% of the time - needs direction nearly all the time or does not effectively solve problems and may need a restraint for safety  Memory Memory: 1-Recognizes or recalls less than 25% of the time/requires cueing greater than 75% of the time  Medical Problem List and Plan: 1. Functional deficits secondary to bilateral frontoparietal craniectomy for tumor excision of meningioma 05/08/2014 with cranioplasty 05/22/2014 2. DVT Prophylaxis/Anticoagulation: Subcutaneous heparin. Monitor platelet counts of any signs of bleeding 3. Pain Management: Hydrocodone as needed. Monitor with increased mobility 4. Dysphagia. Dysphagia 2 thin liquid diet. Follow-up speech therapy 5. Neuropsych: This patient is capable of making decisions on his own behalf. 6. Skin/Wound Care: skin intact 7. Fluids/Electrolytes/Nutrition: good po intake 8. Seizure prophylaxis. Keppra 500 mg twice a day.  9. Hypothyroidism. Synthroid 10. Urine retention: some improvement---I/O cath prn.  LOS (Days) 4 A FACE TO FACE EVALUATION WAS PERFORMED  SWARTZ,ZACHARY T 05/29/2014 8:09 AM

## 2014-05-29 NOTE — Progress Notes (Signed)
Occupational Therapy Session Note  Patient Details  Name: Geoffrey Neal MRN: [id number] Date of Birth: 05/05/1945  Today's Date: 05/29/2014 OT Individual Time:  -   1100-1200  (60 min)      Short Term Goals: Week 1:  OT Short Term Goal 1 (Week 1): Pt will complete LB dressing with min A OT Short Term Goal 2 (Week 1): Pt will completed self-care task in standing for 1 min with min A for balance OT Short Term Goal 3 (Week 1): Pt will complete functional transfer with min A and min cues for positioning of LLE OT Short Term Goal 4 (Week 1): Pt will complete bathing with min A and min cues for initiation and sequencing      Skilled Therapeutic Interventions/Progress Updates:    Skilled OT intervention with treatment focus on the following:  Transfers, sit to stand, standing balance, LUE NMRE, motor planning, endurance, problem solving. Organizing and sequencing.     Pt. Sitting on toilet trying to have BM. Transfer to shower seat with minimal assist.  Assisted pt with ambulating to shower seat.  Pt. Stood during shower with minimal assist and facilitation to Left side to maintain upright posture.  Pt. Stood for 3 -4 minutes x 6 during shower  Dressed in sitting on shower seat and then transferred to wc and completed rest of dressing bedside.  Wife assisted with socks and pt left in bed.   Pt seemed to rely on wife for more of dressing needs rather than do for self.  Pt. Transferred to bed and and laid down for 5 minutes.  Returned to sitting to eat lunch on the EOB.   Nursing came into room to address constipation and medical intervention.    Therapy Documentation Precautions:  Precautions Precautions: Fall Restrictions Weight Bearing Restrictions: No    Pain: Pain Assessment Pain Assessment: 7/10  Right ribs due to constipation.  Pt reports he has not had a BM in 10 days         See FIM for current functional status  Therapy/Group: Individual Therapy  Lisa Roca 05/29/2014,  9:52 AM

## 2014-05-30 ENCOUNTER — Inpatient Hospital Stay (HOSPITAL_COMMUNITY): Payer: Medicare Other

## 2014-05-30 ENCOUNTER — Encounter (HOSPITAL_COMMUNITY): Payer: Medicare Other | Admitting: Occupational Therapy

## 2014-05-30 ENCOUNTER — Inpatient Hospital Stay (HOSPITAL_COMMUNITY): Payer: Medicare Other | Admitting: Speech Pathology

## 2014-05-30 DIAGNOSIS — G8220 Paraplegia, unspecified: Secondary | ICD-10-CM | POA: Diagnosis present

## 2014-05-30 NOTE — Progress Notes (Signed)
Speech Language Pathology Daily Session Note  Patient Details  Name: Geoffrey Neal MRN: [id number] Date of Birth: Jul 04, 1945  Today's Date: 05/30/2014 SLP Individual Time: 4158-3094 SLP Individual Time Calculation (min): 30 min  Short Term Goals: Week 1: SLP Short Term Goal 1 (Week 1): Patient will demonstrate functional problem solving during basic and familiar tasks with Min A multimodal cues.  SLP Short Term Goal 2 (Week 1): Patient will recall new, daily information with Min A multimodal cues.  SLP Short Term Goal 3 (Week 1): Patient will identify 2 physical and 2 cognitive deficits with Min A multimodal cues.  SLP Short Term Goal 4 (Week 1): Patient will demonstrate selective attention in a mildly distracting enviornment for 60 minutes with Min A multimodal cues for redirection.  SLP Short Term Goal 5 (Week 1): Patient will utilize call bell to request assistance with Min A multimodal cues.   Skilled Therapeutic Interventions:  Pt was seen for skilled ST targeting cognitive goals.  Upon arrival, pt was seated upright in wheelchair, awake, alert, and agreeable to participate in [place].  SLP facilitated the session with a semi-complex card game targeting planning, thought organization, and mental flexibility.  Pt completed the abovementioned task with mod faded to min assist-supervision verbal cues.  Pt utilized teach back to demonstrate recall of 2 out of 3 rules of the game which improved to 3 out of 3 recall with min question cues.  Continue per current plan of care.    FIM:  Comprehension Comprehension Mode: Auditory Comprehension: 5-Follows basic conversation/direction: With extra time/assistive device Expression Expression Mode: Verbal Expression: 5-Expresses basic needs/ideas: With extra time/assistive device Social Interaction Social Interaction: 5-Interacts appropriately 90% of the time - Needs monitoring or encouragement for participation or interaction. Problem Solving Problem  Solving: 4-Solves basic 75 - 89% of the time/requires cueing 10 - 24% of the time Memory Memory: 4-Recognizes or recalls 75 - 89% of the time/requires cueing 10 - 24% of the time  Pain Pain Assessment Pain Assessment: No/denies pain  Therapy/Group: Individual Therapy  Ashtin Rosner, Selinda Orion 05/30/2014, 4:12 PM

## 2014-05-30 NOTE — Progress Notes (Signed)
Occupational Therapy Session Note  Patient Details  Name: Geoffrey Neal MRN: [id number] Date of Birth: 10-May-1945  Today's Date: 05/30/2014 OT Individual Time: 0900-1000 OT Individual Time Calculation (min): 60 min    Short Term Goals: Week 1:  OT Short Term Goal 1 (Week 1): Pt will complete LB dressing with min A OT Short Term Goal 2 (Week 1): Pt will completed self-care task in standing for 1 min with min A for balance OT Short Term Goal 3 (Week 1): Pt will complete functional transfer with min A and min cues for positioning of LLE OT Short Term Goal 4 (Week 1): Pt will complete bathing with min A and min cues for initiation and sequencing  Skilled Therapeutic Interventions/Progress Updates:    1:1 Pt in bed when arrive reporting he felt ready to go home and he could get better at home.  Discussed concerns for home, his high fall risk, goals of therapy and the rehab process. Pt agreeable to continue to work hard today. Self care retraining at shower level. Focus of session was on higher level balance, awareness of left LE in standign and in prep for sit to stand, safety with RW, slowing down to remain safe, functional active movement in left LE in bathing and dressing, functional ambulation with RW, and standing balance. Pt ambulated with AFO on left LE to bathroom with RW and into the shower. Pt performed bathing sit to stand  (mulitples times) in shower. Pt continues to demonstrate a narrow BOS in standing and decr awareness of position of Left Le at times resulting in LOB to the left requiring min A to correct. Pt required mod A  Throughout session to slow down with mobility to decr fall risk.  Sat in chair in BR to dress. Pt easily frustrated with trying to dress left LE but able to successfully do it with extra time and encouragement. Pt stood at time to brush teeth and shave with supervision to min A with mod cuing to avoid narrowing his BOS. Continued to work on functional mobility and safety  RW by making up his bed and cleaning up towels.  In side stepping pt required more than reasonable amt of time due to having to be more intentional about stepping with left LE first (on both sides of the bed).  Pt did report fatigue at end of session.   Pt continues to be a high fall risk and continued to stress this to pt.  Interpreter present for session.   Therapy Documentation Precautions:  Precautions Precautions: Fall Restrictions Weight Bearing Restrictions: No Pain:  no c/o pain   See FIM for current functional status  Therapy/Group: Individual Therapy  Willeen Cass [facility] 05/30/2014, 10:32 AM

## 2014-05-30 NOTE — Progress Notes (Signed)
Physical Therapy Session Note  Patient Details  Name: Geoffrey Neal MRN: [id number] Date of Birth: 07-27-1945  Today's Date: 05/30/2014 PT Individual Time: Treatment Session 1: 1000-1100; Treatment Session 2: 1330-1400 PT Individual Time Calculation (min): Treatment Session 1: 60 min; Treatment Session 2: 22min  Short Term Goals: Week 1:  PT Short Term Goal 1 (Week 1): pt will perfrom squat pivot t/f bed<>w/c with min A PT Short Term Goal 2 (Week 1): pt will consistently propel w/c 150' with supervision in controlled environment PT Short Term Goal 3 (Week 1): pt will ambulate 93' with LRAD and mod Ax1person PT Short Term Goal 4 (Week 1): pt will negotiate 12 stairs with 1 rail and minA PT Short Term Goal 5 (Week 1): Pt will maintain dynamic standing balance during functional task with min Ax1person  Skilled Therapeutic Interventions/Progress Updates:  1:1. Pt received sitting EOB with OT in room, PT taking over. Interpreter present. Focus this session on functional endurance, intellectual awareness, safety during functional transfers, gait training and NMR in standing. Pt req overall min A for multiple t/f sit<>stand this session with RW including toileting/toilet transfer with consistent min cues for hand placement.   Pt req min guard-min A for ambulation 175'x1 with RW and L AFO, mod cues for increased L step length, increased BOS and sustained attention to functional task due to distractibility with L foot catching behind R foot 30% of steps. When questioned why pt's L foot was caught behind R foot, especially during turns, pt stating, "oh I'm doing it on purpose."   Pt with questions regarding when he can go home as well as purpose of AFO. Pt educated on L LE impairments and AFO to address decreased stability, decreased L foot clearance, to decrease risk for fall as well as increase level of functional independence. Pt only passively accepting education. Pt amb 175'x2 with RW without use of  AFO demonstrating significantly decreased overall quality of ambulation and consistent min A. Without cues, pt demonstrating non-fluid L step-to pattern with consistent L toe catch. Pt demonstrating no initiation to self-correct, req consistent max cues for improved pattern.  Pt challenged to completion floor transfer without use of AFO to further highlight decreased strength/coordination in L LE req min-mod A for management of L LE.   Pt engaged in high level balance challenge to target distal L LE impairments by throwing basketball at rebounder while standing on balance board to target ankle strategy. Cognitive challenges progressively added during task for increased challenge. Pt req overall mod-max A for balance. Pt req mod encouragement to attempt task stating that it was too difficult, again emphasis on impairments and purpose of therapy.   Pt left sitting in w/c at end of session with all needs in reach, quick release belt in place and interpreter in room.    Treatment Session 2:  1:1. Pt received supine in bed sleeping with family at side, easy to wake and agreeable to therapy. Grandsons providing interpreting basic commands as needed during tx session. Focus this session on gait training and B LE NMR. Pt req supervision for t/f sup>sit EOB without use of hospital bed functions and min A to don shoes, however, immediately asked for assistance with task from grandsons. Re-emphasized pt's need to do as much as he could before asking for help. Pt req overall min A for multiple t/f sit<>stand with RW and consistent cues for hand placement.   Pt req min A for ambulation 165'x2 with min-mod A for  stability and management of RW due to distractibility. AFO not utilized to facilitate intellectual awareness of L LE impairments, promote sustained attention and emphasis on increased L step length as well as heel toe pattern, pt req overall mod cues due to distractibility and lack of acknowledgement regarding  distal LE impairments.   Pt utilized NuStep to target sustained attention to reciprocal B LE movement to address strength and coordination. Pt with good tolerance to level 4x10 minutes with pace in 50s.   Pt left supine in bed at end of session with all needs in reach, bed alarm on and family at side.    Ongoing education all sessions regarding pt's impairments, role of therapies, poor intellectual/safety awareness as well as goals for safe discharge to home environment as pt is consistently asking when he is going home and denying impairments.   Therapy Documentation Precautions:  Precautions Precautions: Fall Restrictions Weight Bearing Restrictions: No  See FIM for current functional status  Therapy/Group: Individual Therapy  [name] 05/30/2014, 1:48 PM

## 2014-05-30 NOTE — Progress Notes (Signed)
[facility] PHYSICAL MEDICINE & REHABILITATION     PROGRESS NOTE    Subjective/Complaints: "can I go home?" Pt states he feels good today Discussed goals of rehab and tent D/C date of 2/18  ROS-limited by language but denies issues with breathing , bowels , headache pain Objective: Vital Signs: Blood pressure 111/71, pulse 74, temperature 98.3 F (36.8 C), temperature source Oral, resp. rate 18, height 5\' 7"  (1.702 m), weight 88.5 kg (195 lb 1.7 oz), SpO2 97 %. No results found. No results for input(s): WBC, HGB, HCT, PLT in the last 72 hours. No results for input(s): NA, K, CL, GLUCOSE, BUN, CREATININE, CALCIUM in the last 72 hours.  Invalid input(s): CO CBG (last 3)  No results for input(s): GLUCAP in the last 72 hours.  Wt Readings from Last 3 Encounters:  05/30/14 88.5 kg (195 lb 1.7 oz)  05/07/14 95.7 kg (210 lb 15.7 oz)  08/17/13 93.895 kg (207 lb)    Physical Exam:  HENT: oral mucosa pink, dentition poor Cranioplasty site clean and dry with staples intact  Eyes:  Pupils round and reactive to light without nystagmus  Neck: Normal range of motion. Neck supple. No thyromegaly present.  Cardiovascular: Normal rate and regular rhythm. no murmurs or rubs or gallops Respiratory: Effort normal and breath sounds normal. No respiratory distress. No wheezes GI: Soft. Bowel sounds are normal. He exhibits no distension.  Neurological: no focal CN abnormalities. Tracks to all fields. No tongue or facial deviation Patient is alert and makes good eye contact with examiner. He does follow simple commands. UES: 4/5 delt,tric,bic,wrist, HI. Mild left pronator drift. LES: 3+/5 HF, 4- KE and ADF/APF. .  Skin: Skin is warm and dry.  Psych: mood pleasant and cooperative  Assessment/Plan: 1. Functional deficits secondary to meningioma s/p resection/cranioplasty which require 3+ hours per day of interdisciplinary therapy in a comprehensive inpatient rehab setting. Physiatrist is  providing close team supervision and 24 hour management of active medical problems listed below. Physiatrist and rehab team continue to assess barriers to discharge/monitor patient progress toward functional and medical goals. FIM: FIM - Bathing Bathing Steps Patient Completed: Chest, Right Arm, Left Arm, Abdomen, Front perineal area, Right upper leg, Left upper leg, Right lower leg (including foot), Buttocks Bathing: 4: Min-Patient completes 8-9 90f 10 parts or 75+ percent  FIM - Upper Body Dressing/Undressing Upper body dressing/undressing steps patient completed: Thread/unthread right sleeve of pullover shirt/dresss, Thread/unthread left sleeve of pullover shirt/dress, Put head through opening of pull over shirt/dress, Pull shirt over trunk Upper body dressing/undressing: 5: Supervision: Safety issues/verbal cues FIM - Lower Body Dressing/Undressing Lower body dressing/undressing steps patient completed: Thread/unthread right underwear leg, Pull underwear up/down, Thread/unthread right pants leg, Don/Doff right shoe, Pull pants up/down Lower body dressing/undressing: 3: Mod-Patient completed 50-74% of tasks  FIM - Toileting Toileting steps completed by patient: Performs perineal hygiene, Adjust clothing prior to toileting, Adjust clothing after toileting Toileting Assistive Devices: Grab bar or rail for support Toileting: 4: Steadying assist  FIM - Radio producer Devices: Grab bars Toilet Transfers: 4-To toilet/BSC: Min A (steadying Pt. > 75%), 4-From toilet/BSC: Min A (steadying Pt. > 75%)  FIM - Bed/Chair Transfer Bed/Chair Transfer Assistive Devices: Arm rests Bed/Chair Transfer: 5: Supine > Sit: Supervision (verbal cues/safety issues), 4: Bed > Chair or W/C: Min A (steadying Pt. > 75%), 4: Chair or W/C > Bed: Min A (steadying Pt. > 75%)  FIM - Locomotion: Wheelchair Distance: 75 Locomotion: Wheelchair: 4: Travels 150 ft  or more: maneuvers on rugs and  over door sillls with minimal assistance (Pt.>75%) FIM - Locomotion: Ambulation Locomotion: Ambulation Assistive Devices: Walker - Rolling Ambulation/Gait Assistance: 4: Min assist Locomotion: Ambulation: 4: Travels 150 ft or more with minimal assistance (Pt.>75%)  Comprehension Comprehension Mode: Auditory Comprehension: 4-Understands basic 75 - 89% of the time/requires cueing 10 - 24% of the time  Expression Expression Mode: Verbal Expression: 4-Expresses basic 75 - 89% of the time/requires cueing 10 - 24% of the time. Needs helper to occlude trach/needs to repeat words.  Social Interaction Social Interaction: 5-Interacts appropriately 90% of the time - Needs monitoring or encouragement for participation or interaction.  Problem Solving Problem Solving: 5-Solves basic 90% of the time/requires cueing < 10% of the time  Memory Memory: 4-Recognizes or recalls 75 - 89% of the time/requires cueing 10 - 24% of the time  Medical Problem List and Plan: 1. Functional deficits secondary to bilateral frontoparietal craniectomy for tumor excision of meningioma 05/08/2014 with cranioplasty 05/22/2014- staple removal soon 2. DVT Prophylaxis/Anticoagulation: Subcutaneous heparin. Monitor platelet counts of any signs of bleeding 3. Pain Management: Hydrocodone as needed. Monitor with increased mobility 4. Dysphagia. Dysphagia 2 thin liquid diet. Follow-up speech therapy 5. Neuropsych: This patient is capable of making decisions on his own behalf. 6. Skin/Wound Care: skin intact 7. Fluids/Electrolytes/Nutrition: good po intake 8. Seizure prophylaxis. Keppra 500 mg twice a day.  9. Hypothyroidism. Synthroid 10. Urine retention: some improvement---I/O cath prn.  LOS (Days) 5 A FACE TO FACE EVALUATION WAS PERFORMED  Geoffrey Neal 05/30/2014 8:07 AM

## 2014-05-31 ENCOUNTER — Inpatient Hospital Stay (HOSPITAL_COMMUNITY): Payer: Medicare Other

## 2014-05-31 MED ORDER — HYDROCORTISONE 2.5 % RE CREA
TOPICAL_CREAM | Freq: Two times a day (BID) | RECTAL | Status: DC
  Administered 2014-05-31: 1 via TOPICAL
  Administered 2014-05-31 – 2014-06-04 (×8): via TOPICAL
  Filled 2014-05-31: qty 28.35

## 2014-05-31 MED ORDER — TEMAZEPAM 7.5 MG PO CAPS
7.5000 mg | ORAL_CAPSULE | Freq: Every evening | ORAL | Status: DC | PRN
  Administered 2014-06-01 – 2014-06-04 (×4): 7.5 mg via ORAL
  Filled 2014-05-31 (×4): qty 1

## 2014-05-31 NOTE — Progress Notes (Signed)
[facility] PHYSICAL MEDICINE & REHABILITATION     PROGRESS NOTE    Subjective/Complaints: Going to PT this am Discussed d/c staples ROS-negative has interpreter this am, no HA Objective: Vital Signs: Blood pressure 112/68, pulse 75, temperature 98 F (36.7 C), temperature source Oral, resp. rate 18, height 5\' 7"  (1.702 m), weight 88.5 kg (195 lb 1.7 oz), SpO2 97 %. No results found. No results for input(s): WBC, HGB, HCT, PLT in the last 72 hours. No results for input(s): NA, K, CL, GLUCOSE, BUN, CREATININE, CALCIUM in the last 72 hours.  Invalid input(s): CO CBG (last 3)  No results for input(s): GLUCAP in the last 72 hours.  Wt Readings from Last 3 Encounters:  05/30/14 88.5 kg (195 lb 1.7 oz)  05/07/14 95.7 kg (210 lb 15.7 oz)  08/17/13 93.895 kg (207 lb)    Physical Exam:  HENT: oral mucosa pink, dentition poor Cranioplasty site clean and dry with staples intact  Eyes:  Pupils round and reactive to light without nystagmus  Neck: Normal range of motion. Neck supple. No thyromegaly present.  Cardiovascular: Normal rate and regular rhythm. no murmurs or rubs or gallops Respiratory: Effort normal and breath sounds normal. No respiratory distress. No wheezes GI: Soft. Bowel sounds are normal. He exhibits no distension.  Neurological: no focal CN abnormalities. Tracks to all fields. No tongue or facial deviation Patient is alert and makes good eye contact with examiner. He does follow simple commands. UES: 4/5 delt,tric,bic,wrist, HI. Mild left pronator drift. LES: 3+/5 HF, 4- KE and ADF/APF. .  Skin: Skin is warm and dry.  Psych: mood pleasant and cooperative  Assessment/Plan: 1. Functional deficits secondary to meningioma s/p resection/cranioplasty which require 3+ hours per day of interdisciplinary therapy in a comprehensive inpatient rehab setting. Physiatrist is providing close team supervision and 24 hour management of active medical problems listed  below. Physiatrist and rehab team continue to assess barriers to discharge/monitor patient progress toward functional and medical goals. FIM: FIM - Bathing Bathing Steps Patient Completed: Chest, Right Arm, Left Arm, Abdomen, Front perineal area, Right upper leg, Left upper leg, Right lower leg (including foot), Buttocks, Left lower leg (including foot) Bathing: 4: Steadying assist  FIM - Upper Body Dressing/Undressing Upper body dressing/undressing steps patient completed: Thread/unthread right sleeve of pullover shirt/dresss, Thread/unthread left sleeve of pullover shirt/dress, Put head through opening of pull over shirt/dress, Pull shirt over trunk Upper body dressing/undressing: 5: Supervision: Safety issues/verbal cues FIM - Lower Body Dressing/Undressing Lower body dressing/undressing steps patient completed: Thread/unthread right underwear leg, Thread/unthread left underwear leg, Pull underwear up/down, Thread/unthread right pants leg, Thread/unthread left pants leg, Pull pants up/down, Don/Doff left sock, Don/Doff right shoe, Don/Doff right sock, Fasten/unfasten right shoe, Fasten/unfasten left shoe Lower body dressing/undressing: 4: Min-Patient completed 75 plus % of tasks  FIM - Toileting Toileting steps completed by patient: Performs perineal hygiene, Adjust clothing prior to toileting, Adjust clothing after toileting Toileting Assistive Devices: Grab bar or rail for support Toileting: 4: Steadying assist  FIM - Radio producer Devices: Grab bars Toilet Transfers: 4-To toilet/BSC: Min A (steadying Pt. > 75%), 4-From toilet/BSC: Min A (steadying Pt. > 75%)  FIM - Control and instrumentation engineer Devices: Arm rests, Copy: 4: Bed > Chair or W/C: Min A (steadying Pt. > 75%), 4: Chair or W/C > Bed: Min A (steadying Pt. > 75%), 5: Supine > Sit: Supervision (verbal cues/safety issues), 5: Sit > Supine: Supervision (verbal  cues/safety issues)  FIM -  Locomotion: Wheelchair Distance: 75 Locomotion: Wheelchair: 2: Travels 95 - 149 ft with supervision, cueing or coaxing FIM - Locomotion: Ambulation Locomotion: Ambulation Assistive Devices: Administrator Ambulation/Gait Assistance: 4: Min assist, 4: Min guard, 3: Mod assist Locomotion: Ambulation: 3: Travels 150 ft or more with moderate assistance (Pt: 50 - 74%)  Comprehension Comprehension Mode: Auditory Comprehension: 5-Follows basic conversation/direction: With extra time/assistive device  Expression Expression Mode: Verbal Expression: 5-Expresses basic needs/ideas: With extra time/assistive device  Social Interaction Social Interaction: 5-Interacts appropriately 90% of the time - Needs monitoring or encouragement for participation or interaction.  Problem Solving Problem Solving: 4-Solves basic 75 - 89% of the time/requires cueing 10 - 24% of the time  Memory Memory: 4-Recognizes or recalls 75 - 89% of the time/requires cueing 10 - 24% of the time  Medical Problem List and Plan: 1. Functional deficits secondary to bilateral frontoparietal craniectomy for tumor excision of meningioma 05/08/2014 with cranioplasty 05/22/2014- staple removal soon 2. DVT Prophylaxis/Anticoagulation: Subcutaneous heparin. Monitor platelet counts of any signs of bleeding 3. Pain Management: Hydrocodone as needed. Monitor with increased mobility 4. Dysphagia. Dysphagia 2 thin liquid diet. Follow-up speech therapy 5. Neuropsych: This patient is capable of making decisions on his own behalf. 6. Skin/Wound Care: skin intact 7. Fluids/Electrolytes/Nutrition: good po intake 8. Seizure prophylaxis. Keppra 500 mg twice a day.  9. Hypothyroidism. Synthroid 10. Urine retention: some improvement---I/O cath prn.  LOS (Days) 6 A FACE TO FACE EVALUATION WAS PERFORMED  Charlett Blake 05/31/2014 9:32 AM

## 2014-05-31 NOTE — Progress Notes (Signed)
Physical Therapy Session Note  Patient Details  Name: Geoffrey Neal MRN: [id number] Date of Birth: [birth year]  Today's Date: 05/31/2014 PT Individual Time: 0900-0950 PT Individual Time Calculation (min): 50 min  and Today's Date: 05/31/2014 PT Missed Time: 10 Minutes Missed Time Reason: Toileting  Short Term Goals: Week 1:  PT Short Term Goal 1 (Week 1): pt will perfrom squat pivot t/f bed<>w/c with min A PT Short Term Goal 2 (Week 1): pt will consistently propel w/c 150' with supervision in controlled environment PT Short Term Goal 3 (Week 1): pt will ambulate 67' with LRAD and mod Ax1person PT Short Term Goal 4 (Week 1): pt will negotiate 12 stairs with 1 rail and minA PT Short Term Goal 5 (Week 1): Pt will maintain dynamic standing balance during functional task with min Ax1person  Skilled Therapeutic Interventions/Progress Updates:    Pt received supine in bed, agreeable to participate in therapy. Interpreter present throughout session. Session focused on functional endurance, gait training, w/c propulsion and parts management. Pt donned shorts w/ set up assist and socks/shoes w/ MinA to don shoe on L foot. Instructed pt in blocked practice transfers w/c<>bed w/ overall CGA and mod fading to min cueing for sequencing and hand placement. Instructed pt in w/c parts management w/ pt able to don/doff leg rests on w/c w/ Mod fading to MinA w/ consistent max cueing for technique. Gait training 200' w/ RW and Min guard A w/ L AFO, no foot drag or scissoring noted except during turns to R. Trialed gait trainign w/ 2# weight on L foot to increase proprioceptive feedback during turns, however noted increased foot drag on L w/ weight and d/c'ed it for remainder of gait training. With verbal cueing to keep feet widened during turns pt demonstrated improved technique and carryover. With fatigue noted decreased step length on L and pt benefited from verbal cueing to take larger steps w/ L. Instructed pt in  standing balance/coordination exercise of tapping foot at visual target on 5" stair w/ 2 progressing to 1 UE support, Min Guard to maintain balance. Instructed pt in stair training 5' then full flight w/ 2 rails and MinA w/ min fading to no cueing for sequencing. Pt expressed need for bathroom. Propelled w/c 150' back to room then ambulated 10' w/ RW and Min Guard to toilet. Pt left seated on toilet with interpreter in room and NT on her way to room. Pt missed 10 minutes of scheduled PT time for toileting.   Therapy Documentation Precautions:  Precautions Precautions: Fall Restrictions Weight Bearing Restrictions: No Pain:  No/denies pain  See FIM for current functional status  Therapy/Group: Individual Therapy  Rada Hay  Rada Hay, PT, DPT 05/31/2014, 7:35 AM

## 2014-06-01 ENCOUNTER — Encounter (HOSPITAL_COMMUNITY): Payer: Medicare Other

## 2014-06-01 ENCOUNTER — Inpatient Hospital Stay (HOSPITAL_COMMUNITY): Payer: Medicare Other

## 2014-06-01 ENCOUNTER — Inpatient Hospital Stay (HOSPITAL_COMMUNITY): Payer: Medicare Other | Admitting: Speech Pathology

## 2014-06-01 DIAGNOSIS — G839 Paralytic syndrome, unspecified: Secondary | ICD-10-CM

## 2014-06-01 LAB — URINE CULTURE

## 2014-06-01 MED ORDER — CIPROFLOXACIN HCL 250 MG PO TABS
250.0000 mg | ORAL_TABLET | Freq: Two times a day (BID) | ORAL | Status: DC
  Administered 2014-06-01 – 2014-06-05 (×9): 250 mg via ORAL
  Filled 2014-06-01 (×11): qty 1

## 2014-06-01 NOTE — Progress Notes (Signed)
Occupational Therapy Session Note  Patient Details  Name: Geoffrey Neal MRN: [id number] Date of Birth: July 26, 1945  Today's Date: 06/01/2014 OT Individual Time: 0900-1000 OT Individual Time Calculation (min): 60 min    Short Term Goals: Week 1:  OT Short Term Goal 1 (Week 1): Pt will complete LB dressing with min A OT Short Term Goal 2 (Week 1): Pt will completed self-care task in standing for 1 min with min A for balance OT Short Term Goal 3 (Week 1): Pt will complete functional transfer with min A and min cues for positioning of LLE OT Short Term Goal 4 (Week 1): Pt will complete bathing with min A and min cues for initiation and sequencing  Skilled Therapeutic Interventions/Progress Updates:    Pt resting in bed with interpreter present.  Pt engaged in BADL retraining including bathing at shower level and dressing with sit<>stand from EOB.  Pt required min verbal cues for safety awareness throughout session.  Pt amb with RW to bathroom for shower.  Pt completed bathing and dressing tasks with occasional steady A when standing.  Pt initiated conversation this morning using English but also required use on interpreter for more involved communications.  Pt exhibited LOB X 1 when ambulating out of bathroom and required min A to correct.  Focus on activity tolerance, sit<>stand, standing balance, functional ambulation with RW, and safety awareness.  Therapy Documentation Precautions:  Precautions Precautions: Fall Restrictions Weight Bearing Restrictions: No   Pain: Pain Assessment Pain Assessment: No/denies pain Pain Score: 0-No pain  See FIM for current functional status  Therapy/Group: Individual Therapy  Leroy Libman 06/01/2014, 10:05 AM

## 2014-06-01 NOTE — Progress Notes (Signed)
Physical Therapy Session Note  Patient Details  Name: Geoffrey Neal MRN: [id number] Date of Birth: 1945/05/03  Today's Date: 06/01/2014 PT Individual Time: 1030-1200 PT Individual Time Calculation (min): 90 min   Short Term Goals: Week 1:  PT Short Term Goal 1 (Week 1): pt will perfrom squat pivot t/f bed<>w/c with min A PT Short Term Goal 2 (Week 1): pt will consistently propel w/c 150' with supervision in controlled environment PT Short Term Goal 3 (Week 1): pt will ambulate 21' with LRAD and mod Ax1person PT Short Term Goal 4 (Week 1): pt will negotiate 12 stairs with 1 rail and minA PT Short Term Goal 5 (Week 1): Pt will maintain dynamic standing balance during functional task with min Ax1person  Skilled Therapeutic Interventions/Progress Updates:  1:1. Pt received seated in w/c ready and willing to participate in physical therapy. Interpreter present throughout session. Session focused on functional w/c propulsion, standing endurance and balance, high level ambulation exercises, sustained attention and dual tasks. All sit<>stand transfers were performed at min HH assist. Pt propelled room<>gym 170'x2 with close(S) with use of B UE however required minA for propulsion technique of B LE. Standing with R LE on foam to force L LE to bear weight, pt engaged 10 minutes in game of checkers to work on sustained attention, standing endurance, lateral weight shifts, and overall standing balance with no UE assist.  Pt ambulated, without AFO, 200' with close(S)-minA in controlled environment with limited distractors. With distractors present, pt is observed to have overall decreased attention, often making his L LE inattention more pronounced requiring up to consistent min A. Pt engaged in high level dynamic ambulation activities including sidestepping, grape[place], tandem walking, and kicking a yoga block and weighted ball with L LE to a target all with minA to occasional mod A HH assist to maintain  balance. Decreased L foot clearance continues to be observed during ambulation and high level ambulatory activities requiring consistent cues for attention to L LE clearance. Pt challenged with forward/backward ambulation, with added challenge of a simultaneous cognitive task (counting backwards by 2 from 100) and toss ball with partner to target ability to dual task, pt demonstrated significant decreased in gait speed, cognitive processing, step initiation and overall decreased gait quality indicating that the pt continues to be impaired when asked to complete high level activities.  Pt continues to display decreased intellectual awareness often using humor and various external excuses to minimize his deficits. Even when prompted to acknowledge deficits after a failed attempt to complete a task (i.e. Stand on one leg for >5 seconds), pt continues to minimize. Pt returned to room and left seated in w/c with NT present to assist pt to bathroom.  Therapy Documentation Precautions:  Precautions Precautions: Fall Restrictions Weight Bearing Restrictions: No  See FIM for current functional status  Therapy/Group: Individual Therapy  Carlos Levering 06/01/2014, 4:30 PM

## 2014-06-01 NOTE — Progress Notes (Signed)
PHYSICAL MEDICINE & REHABILITATION     PROGRESS NOTE    Subjective/Complaints: No headache this am. In general they are much better  Objective: Vital Signs: Blood pressure 117/77, pulse 76, temperature 98.2 F (36.8 C), temperature source Oral, resp. rate 17, height 5\' 7"  (1.702 m), weight 88.5 kg (195 lb 1.7 oz), SpO2 98 %. No results found. No results for input(s): WBC, HGB, HCT, PLT in the last 72 hours. No results for input(s): NA, K, CL, GLUCOSE, BUN, CREATININE, CALCIUM in the last 72 hours.  Invalid input(s): CO CBG (last 3)  No results for input(s): GLUCAP in the last 72 hours.  Wt Readings from Last 3 Encounters:  05/30/14 88.5 kg (195 lb 1.7 oz)  05/07/14 95.7 kg (210 lb 15.7 oz)  08/17/13 93.895 kg (207 lb)    Physical Exam:  HENT: oral mucosa pink, dentition poor Cranioplasty site clean and dry with staples intact  Eyes:  Pupils round and reactive to light without nystagmus  Neck: Normal range of motion. Neck supple. No thyromegaly present.  Cardiovascular: Normal rate and regular rhythm. no murmurs or rubs or gallops Respiratory: Effort normal and breath sounds normal. No respiratory distress. No wheezes GI: Soft. Bowel sounds are normal. He exhibits no distension.  Neurological: no focal CN abnormalities. Tracks to all fields. No tongue or facial deviation Patient is alert and makes good eye contact with examiner. He does follow simple commands. UES: 4/5 delt,tric,bic,wrist, HI. Mild left pronator drift. LES: 3+/5 HF, 4- KE and ADF/APF. .  Skin: Skin is warm and dry.  Psych: mood pleasant and cooperative  Assessment/Plan: 1. Functional deficits secondary to meningioma s/p resection/cranioplasty which require 3+ hours per day of interdisciplinary therapy in a comprehensive inpatient rehab setting. Physiatrist is providing close team supervision and 24 hour management of active medical problems listed below. Physiatrist and rehab team continue  to assess barriers to discharge/monitor patient progress toward functional and medical goals. FIM: FIM - Bathing Bathing Steps Patient Completed: Chest, Right Arm, Left Arm, Abdomen, Front perineal area, Right upper leg, Left upper leg, Right lower leg (including foot), Buttocks, Left lower leg (including foot) Bathing: 4: Steadying assist  FIM - Upper Body Dressing/Undressing Upper body dressing/undressing steps patient completed: Thread/unthread right sleeve of pullover shirt/dresss, Thread/unthread left sleeve of pullover shirt/dress, Put head through opening of pull over shirt/dress, Pull shirt over trunk Upper body dressing/undressing: 5: Supervision: Safety issues/verbal cues FIM - Lower Body Dressing/Undressing Lower body dressing/undressing steps patient completed: Thread/unthread right underwear leg, Thread/unthread left underwear leg, Pull underwear up/down, Thread/unthread right pants leg, Thread/unthread left pants leg, Pull pants up/down, Don/Doff left sock, Don/Doff right shoe, Don/Doff right sock, Fasten/unfasten right shoe, Fasten/unfasten left shoe Lower body dressing/undressing: 4: Min-Patient completed 75 plus % of tasks  FIM - Toileting Toileting steps completed by patient: Performs perineal hygiene, Adjust clothing prior to toileting, Adjust clothing after toileting Toileting Assistive Devices: Grab bar or rail for support Toileting: 4: Steadying assist  FIM - Radio producer Devices: Grab bars Toilet Transfers: 4-To toilet/BSC: Min A (steadying Pt. > 75%)  FIM - Control and instrumentation engineer Devices: Arm rests, Copy: 5: Supine > Sit: Supervision (verbal cues/safety issues), 4: Bed > Chair or W/C: Min A (steadying Pt. > 75%), 4: Chair or W/C > Bed: Min A (steadying Pt. > 75%)  FIM - Locomotion: Wheelchair Distance: 75 Locomotion: Wheelchair: 5: Travels 150 ft or more: maneuvers on rugs and over door  sills  with supervision, cueing or coaxing FIM - Locomotion: Ambulation Locomotion: Ambulation Assistive Devices: Walker - Rolling Ambulation/Gait Assistance: 4: Min guard, 4: Min assist, 5: Supervision Locomotion: Ambulation: 4: Travels 150 ft or more with minimal assistance (Pt.>75%)  Comprehension Comprehension Mode: Auditory Comprehension: 5-Understands basic 90% of the time/requires cueing < 10% of the time  Expression Expression Mode: Verbal Expression: 5-Expresses complex 90% of the time/cues < 10% of the time  Social Interaction Social Interaction: 5-Interacts appropriately 90% of the time - Needs monitoring or encouragement for participation or interaction.  Problem Solving Problem Solving: 4-Solves basic 75 - 89% of the time/requires cueing 10 - 24% of the time  Memory Memory: 4-Recognizes or recalls 75 - 89% of the time/requires cueing 10 - 24% of the time  Medical Problem List and Plan: 1. Functional deficits secondary to bilateral frontoparietal craniectomy for tumor excision of meningioma 05/08/2014 with cranioplasty 05/22/2014- dc staples today 2. DVT Prophylaxis/Anticoagulation: Subcutaneous heparin. Monitor platelet counts of any signs of bleeding 3. Pain Management: Hydrocodone as needed. Monitor with increased mobility 4. Dysphagia. Dysphagia 2 thin liquid diet. Follow-up speech therapy 5. Neuropsych: This patient is capable of making decisions on his own behalf. 6. Skin/Wound Care: skin intact 7. Fluids/Electrolytes/Nutrition: good po intake 8. Seizure prophylaxis. Keppra 500 mg twice a day.  9. Hypothyroidism. Synthroid 10. Urine retention: some improvement---I/O cath prn 11. Stooling: dc senna  LOS (Days) 7 A FACE TO FACE EVALUATION WAS PERFORMED  Geoffrey Neal T 06/01/2014 9:05 AM

## 2014-06-01 NOTE — Progress Notes (Signed)
Speech Language Pathology Weekly Progress and Session Note  Patient Details  Name: Geoffrey Neal MRN: [id number] Date of Birth: [birth year]  Beginning of progress report period: May 25, 2014 End of progress report period: June 01, 2014  Today's Date: 06/01/2014 SLP Individual Time: 1000 - 38 SLP Individual Treatment Time (min): 30 minutes  Short Term Goals: Week 1: SLP Short Term Goal 1 (Week 1): Patient will demonstrate functional problem solving during basic and familiar tasks with Min A multimodal cues.  SLP Short Term Goal 1 - Progress (Week 1): Met SLP Short Term Goal 2 (Week 1): Patient will recall new, daily information with Min A multimodal cues.  SLP Short Term Goal 2 - Progress (Week 1): Met SLP Short Term Goal 3 (Week 1): Patient will identify 2 physical and 2 cognitive deficits with Min A multimodal cues.  SLP Short Term Goal 3 - Progress (Week 1): Not met SLP Short Term Goal 4 (Week 1): Patient will demonstrate selective attention in a mildly distracting enviornment for 60 minutes with Min A multimodal cues for redirection.  SLP Short Term Goal 4 - Progress (Week 1): Not met SLP Short Term Goal 5 (Week 1): Patient will utilize call bell to request assistance with Min A multimodal cues.  SLP Short Term Goal 5 - Progress (Week 1): Met    New Short Term Goals: Week 2: SLP Short Term Goal 1 (Week 2): Patient will demonstrate functional problem solving during basic and familiar tasks with Supervision A multimodal cues. SLP Short Term Goal 2 (Week 2): Patient will identify 2 physical and 2 cognitive deficits with Min A multimodal cues.  SLP Short Term Goal 3 (Week 2): Patient will self-monitor and correct errors during completion of functional tasks with Min A multimodal cues. SLP Short Term Goal 4 (Week 2): Patient will recall new, daily information with Supervision multimodal cues. SLP Short Term Goal 5 (Week 2): Patient will demonstrate selective attention in a mildly  distracting environment for 60 minutes with Min A multimodal cues for redirection.  SLP Short Term Goal 6 (Week 2): Patient will utilize call bell to request assistance with Supervision A multimodal cues.   Weekly Progress Updates: Patient has made functional gains this reporting period and met 3 out of 5 short term goals. Currently patient continues to present with decreased emergent awareness of impairments, suspect that patient may be using humor to mask deficits. Patient continues to need Min-Mod A multimodal cues for safe completion of functional, mildly complex  tasks in regards to functional problem solving, working memory, selective attention, and emerging awareness. Patient's speech and language abilities are [facility], with patient beginning to utilize English more during conversations. Patient and family education is ongoing; patient would continue to benefit from skilled SLP intervention in order to maximize his safety and functional independence and reduce caregiver burden prior to discharge.   Intensity: Minumum of 1-2 x/day, 30 to 90 minutes Frequency: 5 out of 7 days Duration/Length of Stay: 2/18 Treatment/Interventions: Cognitive remediation/compensation;Cueing hierarchy;Functional tasks;Environmental controls;Internal/external aids;Speech/Language facilitation;Patient/family education;Therapeutic Activities   Daily Session  Skilled Therapeutic Interventions:  Skilled treatment session with patient's wife and translator present focused on cognitive goals. Student facilitated session through patient teach-back of mildly complex problem-solving card task learned two days ago. Patient required Min A multimodal cues for recall of game setup and rules, and needed Min A multimodal cues for planning, error awareness, and mental flexibility to complete task. Patient needed Supervision A multimodal cues for use of both [place]  and Spanish to clarify meanings when having a functional conversation  while student spoke basic Spanish. Patient left in room in wheelchair with wife and translator present. Continue with current plan of care.     FIM:  Comprehension Comprehension Mode: Auditory Comprehension: 5-Understands basic 90% of the time/requires cueing < 10% of the time Expression Expression Mode: Verbal Expression: 5-Expresses complex 90% of the time/cues < 10% of the time Social Interaction Social Interaction: 5-Interacts appropriately 90% of the time - Needs monitoring or encouragement for participation or interaction. Problem Solving Problem Solving: 4-Solves basic 75 - 89% of the time/requires cueing 10 - 24% of the time Memory Memory: 4-Recognizes or recalls 75 - 89% of the time/requires cueing 10 - 24% of the time    Pain No/Denies Pain  Therapy/Group: Individual Therapy  Servando Snare 06/01/2014, 4:40 PM

## 2014-06-02 ENCOUNTER — Inpatient Hospital Stay (HOSPITAL_COMMUNITY): Payer: Medicare Other

## 2014-06-02 ENCOUNTER — Encounter (HOSPITAL_COMMUNITY): Payer: Medicare Other

## 2014-06-02 NOTE — Progress Notes (Signed)
Physical Therapy Session Note  Patient Details  Name: Geoffrey Neal MRN: [id number] Date of Birth: Oct 18, 1945  Today's Date: 06/02/2014 PT Individual Time: 1100-1200 PT Individual Time Calculation (min): 60 min   Short Term Goals: Week 1:  PT Short Term Goal 1 (Week 1): pt will perfrom squat pivot t/f bed<>w/c with min A PT Short Term Goal 2 (Week 1): pt will consistently propel w/c 150' with supervision in controlled environment PT Short Term Goal 3 (Week 1): pt will ambulate 59' with LRAD and mod Ax1person PT Short Term Goal 4 (Week 1): pt will negotiate 12 stairs with 1 rail and minA PT Short Term Goal 5 (Week 1): Pt will maintain dynamic standing balance during functional task with min Ax1person  Skilled Therapeutic Interventions/Progress Updates:  1:1. Pt received in room, supine in bed with wife and interpreter present. Interpreter was present throughout session. Session focused on community ambulation, pt and family education addressing safety and intellectual awareness, car transfers, and balance activities with biofeedback. Pt required supervision-min guard for all bed mobility and transfers this session, including sit>supine, sit<>stand and stand pivot transfers. Pt ambulated >500'x3 with close(S)-min A without AD in community environment off unit in busy hallways, over thresholds and navigating common obstacles. Occasional LOB due to distractability requiring min A or use of wall/railing to maintain balance and intermittent breaks to allow pt to rest. Pt demonstrated improved consistency of L foot clearance this session. Pt demonstrated significantly decreased ambulatory speed as reported by wife and difficulty blocking out distractions of the community environment and focusing on task. Pt conversational throughout ambulation bouts, indicating improved ability to multitask however deficits continue to be seen when asked to multitask when a complex physical task is involved. Car transfer was  performed with overall supervision in/out of standard height car. Balance exercises with biofeedback on Bi[employer] to address decreased ankle balance strategy and weight shift to the L in all planes of motion including circular patterns. Pt reported exercises were difficult and required intermittent rest breaks.  Pt/family education was performed throughout session including reinforcing continued need for 24/7 supervision upon discharge, safety with transfers with an emphasis on car transfer and the fact that pt is currently not safe to drive. Education also focused on pt's deficits, focusing on decreased balance and functional endurance far from PLOF, and the continued need for skilled physical therapy both during the remainder of his time in CIR and after d/c. Pt and wife verbalized understanding.  Pt was returned to room and left seated in chair, quick release belt in place, wife in room and all needs within reach.  Therapy Documentation Precautions:  Precautions Precautions: Fall Restrictions Weight Bearing Restrictions: No  Pain: Pain Assessment Pain Assessment: No/denies pain Pain Score: 0-No pain Mobility:  See FIM for current functional status  Therapy/Group: Individual Therapy  Carlos Levering 06/02/2014, 12:17 PM

## 2014-06-02 NOTE — Progress Notes (Signed)
Occupational Therapy Session Note  Patient Details  Name: Geoffrey Neal MRN: [id number] Date of Birth: [birth year]  Today's Date: 06/02/2014 OT Individual Time: 1300-1400 OT Individual Time Calculation (min): 60 min    Short Term Goals: Week 1:  OT Short Term Goal 1 (Week 1): Pt will complete LB dressing with min A OT Short Term Goal 2 (Week 1): Pt will completed self-care task in standing for 1 min with min A for balance OT Short Term Goal 3 (Week 1): Pt will complete functional transfer with min A and min cues for positioning of LLE OT Short Term Goal 4 (Week 1): Pt will complete bathing with min A and min cues for initiation and sequencing  Skilled Therapeutic Interventions/Progress Updates:    Engaged in therapeutic activity with focus on functional transfers and dynamic balance during higher level balance activity.  Pt in bathroom upon arrival, completed toileting and stand pivot transfer toilet > w/c with distant supervision.  In ADL apt completed transfer in/out of tub/shower by stepping over ledge, pt with heavy use of grab bars during this transfer and decreased awareness of LLE during transfer increasing fall risk.  Recommend pt use tub transfer bench to increase safety with transfers, pt reports son can install grab bars as well.  Engaged in high level balance activity to focus on balance as well as awareness of deficits.  Pt required min assist with soccer kicking and dribbling activity due to occasional dragging of Lt foot.    Therapy Documentation Precautions:  Precautions Precautions: Fall Restrictions Weight Bearing Restrictions: No General:   Vital Signs: Therapy Vitals Temp: 97.9 F (36.6 C) Temp Source: Oral Pulse Rate: 81 Resp: 20 BP: 98/62 mmHg Patient Position (if appropriate): Lying Oxygen Therapy SpO2: 96 % O2 Device: Not Delivered Pain:  Pt with no c/o pain  See FIM for current functional status  Therapy/Group: Individual Therapy  Simonne Come 06/02/2014, 3:15 PM

## 2014-06-02 NOTE — Progress Notes (Signed)
[place] PHYSICAL MEDICINE & REHABILITATION     PROGRESS NOTE    Subjective/Complaints: Pain improved. Staples out. Feeling well. Anxious to go home sooner than date.  Objective: Vital Signs: Blood pressure 106/77, pulse 72, temperature 98 F (36.7 C), temperature source Oral, resp. rate 18, height 5\' 7"  (1.702 m), weight 88.5 kg (195 lb 1.7 oz), SpO2 99 %. No results found. No results for input(s): WBC, HGB, HCT, PLT in the last 72 hours. No results for input(s): NA, K, CL, GLUCOSE, BUN, CREATININE, CALCIUM in the last 72 hours.  Invalid input(s): CO CBG (last 3)  No results for input(s): GLUCAP in the last 72 hours.  Wt Readings from Last 3 Encounters:  05/30/14 88.5 kg (195 lb 1.7 oz)  05/07/14 95.7 kg (210 lb 15.7 oz)  08/17/13 93.895 kg (207 lb)    Physical Exam:  HENT: oral mucosa pink, dentition poor Cranioplasty site clean and dry with staples intact  Eyes:  Pupils round and reactive to light without nystagmus  Neck: Normal range of motion. Neck supple. No thyromegaly present.  Cardiovascular: Normal rate and regular rhythm. no murmurs or rubs or gallops Respiratory: Effort normal and breath sounds normal. No respiratory distress. No wheezes GI: Soft. Bowel sounds are normal. He exhibits no distension.  Neurological: no focal CN abnormalities. Tracks to all fields. No tongue or facial deviation Patient is alert and makes good eye contact with examiner. He does follow simple commands. UES: 4/5 delt,tric,bic,wrist, HI. Mild left pronator drift. LES: 3+/5 HF, 4- KE and ADF/APF. .  Skin: Skin is warm and dry.  Psych: mood pleasant and cooperative  Assessment/Plan: 1. Functional deficits secondary to meningioma s/p resection/cranioplasty which require 3+ hours per day of interdisciplinary therapy in a comprehensive inpatient rehab setting. Physiatrist is providing close team supervision and 24 hour management of active medical problems listed below. Physiatrist  and rehab team continue to assess barriers to discharge/monitor patient progress toward functional and medical goals. FIM: FIM - Bathing Bathing Steps Patient Completed: Chest, Right Arm, Left Arm, Abdomen, Front perineal area, Right upper leg, Left upper leg, Right lower leg (including foot), Buttocks, Left lower leg (including foot) Bathing: 5: Supervision: Safety issues/verbal cues  FIM - Upper Body Dressing/Undressing Upper body dressing/undressing steps patient completed: Thread/unthread right sleeve of pullover shirt/dresss, Thread/unthread left sleeve of pullover shirt/dress, Put head through opening of pull over shirt/dress, Pull shirt over trunk Upper body dressing/undressing: 5: Supervision: Safety issues/verbal cues FIM - Lower Body Dressing/Undressing Lower body dressing/undressing steps patient completed: Thread/unthread right underwear leg, Thread/unthread left underwear leg, Pull underwear up/down, Thread/unthread right pants leg, Thread/unthread left pants leg, Pull pants up/down, Don/Doff left sock, Don/Doff right shoe, Don/Doff right sock, Fasten/unfasten right shoe, Fasten/unfasten left shoe Lower body dressing/undressing: 4: Min-Patient completed 75 plus % of tasks  FIM - Toileting Toileting steps completed by patient: Adjust clothing prior to toileting, Performs perineal hygiene, Adjust clothing after toileting Toileting Assistive Devices: Grab bar or rail for support Toileting: 4: Steadying assist  FIM - Radio producer Devices: Grab bars, Insurance account manager Transfers: 5-To toilet/BSC: Supervision (verbal cues/safety issues), 5-From toilet/BSC: Supervision (verbal cues/safety issues)  FIM - Control and instrumentation engineer Devices: Arm rests, Copy: 4: Bed > Chair or W/C: Min A (steadying Pt. > 75%), 4: Chair or W/C > Bed: Min A (steadying Pt. > 75%)  FIM - Locomotion: Wheelchair Distance: 170 Locomotion:  Wheelchair: 2: Travels 50 - 149 ft with minimal assistance (Pt.>75%) FIM -  Locomotion: Ambulation Locomotion: Ambulation Assistive Devices: Other (comment) (None) Ambulation/Gait Assistance: 4: Min guard, 4: Min assist, 5: Supervision Locomotion: Ambulation: 4: Travels 150 ft or more with minimal assistance (Pt.>75%)  Comprehension Comprehension Mode: Auditory Comprehension: 5-Understands basic 90% of the time/requires cueing < 10% of the time  Expression Expression Mode: Verbal Expression: 5-Expresses complex 90% of the time/cues < 10% of the time  Social Interaction Social Interaction: 5-Interacts appropriately 90% of the time - Needs monitoring or encouragement for participation or interaction.  Problem Solving Problem Solving: 4-Solves basic 75 - 89% of the time/requires cueing 10 - 24% of the time  Memory Memory: 4-Recognizes or recalls 75 - 89% of the time/requires cueing 10 - 24% of the time  Medical Problem List and Plan: 1. Functional deficits secondary to bilateral frontoparietal craniectomy for tumor excision of meningioma 05/08/2014 with cranioplasty 05/22/2014- dc staples today 2. DVT Prophylaxis/Anticoagulation: Subcutaneous heparin. Monitor platelet counts of any signs of bleeding 3. Pain Management: Hydrocodone as needed. Monitor with increased mobility 4. Dysphagia. Dysphagia 2 thin liquid diet. Follow-up speech therapy 5. Neuropsych: This patient is capable of making decisions on his own behalf. 6. Skin/Wound Care: skin intact 7. Fluids/Electrolytes/Nutrition: good po intake 8. Seizure prophylaxis. Keppra 500 mg twice a day.  9. Hypothyroidism. Synthroid 10. Urine retention: some improvement---I/O cath prn 11. Stooling: dc senna  LOS (Days) 8 A FACE TO FACE EVALUATION WAS PERFORMED  Geoffrey Neal T 06/02/2014 8:31 AM

## 2014-06-02 NOTE — Progress Notes (Signed)
Occupational Therapy Session Note  Patient Details  Name: Geoffrey Neal MRN: [id number] Date of Birth: 06/09/1945  Today's Date: 06/02/2014 OT Individual Time: 0900-1000 OT Individual Time Calculation (min): 60 min    Short Term Goals: Week 1:  OT Short Term Goal 1 (Week 1): Pt will complete LB dressing with min A OT Short Term Goal 2 (Week 1): Pt will completed self-care task in standing for 1 min with min A for balance OT Short Term Goal 3 (Week 1): Pt will complete functional transfer with min A and min cues for positioning of LLE OT Short Term Goal 4 (Week 1): Pt will complete bathing with min A and min cues for initiation and sequencing  Skilled Therapeutic Interventions/Progress Updates:    Pt engaged in BADL retraining. Interpretor present.  Pt amb with HHA into bathroom for transfer to shower.  Pt donned underpants in bathroom prior to amb back into room to complete dressing and grooming.  Pt stood at sink to shave and brush teeth before returning to bathroom to void while standing at toilet.  Pt continues to require occasional steady A with standing and min verbal cues for safety awareness.  Focus on functional amb without AD, sit<>stand, standing balance, activity tolerance, and safety awareness.   Therapy Documentation Precautions:  Precautions Precautions: Fall Restrictions Weight Bearing Restrictions: No   Pain: Pain Assessment Pain Assessment: No/denies pain Pain Score: 0-No pain  See FIM for current functional status  Therapy/Group: Individual Therapy  Leroy Libman 06/02/2014, 10:01 AM

## 2014-06-02 NOTE — Progress Notes (Signed)
Physical Therapy Weekly Progress Note  Patient Details  Name: AUBERY DOUTHAT MRN: [id number] Date of Birth: 1945/12/07  Beginning of progress report period: May 26, 2014 End of progress report period: June 02, 2014  Patient has met 5 of 5 short term goals. Pt is currently at S for bed mobility and sitting balance; up to minA for standing balance, overall functional transfers, ambulation without AD, and stair negotiation. Pt continues to demonstrate generalized decrease L foot clearance and overall decreased speed especially with distractor's and fatigue. Continued pt and family education on pt deficits with an emphasis on overall intellectual and safety awareness.  Patient continues to demonstrate the following deficits: decreased intellectual awareness/denial regarding impairments, decreased selective attention, poor static/dynamic standing balance, decreased ambulatory speed, decreased endurance, decreased safety and decreased independence with functional transfers and mobility and therefore will continue to benefit from skilled PT intervention to enhance overall performance with activity tolerance, balance, attention, awareness and strength, and functional transfers and ambulation..  Patient progressing toward long term goals..  Continue plan of care.  PT Short Term Goals Week 1:  PT Short Term Goal 1 (Week 1): pt will perfrom squat pivot t/f bed<>w/c with min A PT Short Term Goal 1 - Progress (Week 1): Met PT Short Term Goal 2 (Week 1): pt will consistently propel w/c 150' with supervision in controlled environment PT Short Term Goal 2 - Progress (Week 1): Met PT Short Term Goal 3 (Week 1): pt will ambulate 72' with LRAD and mod Ax1person PT Short Term Goal 3 - Progress (Week 1): Met PT Short Term Goal 4 (Week 1): pt will negotiate 12 stairs with 1 rail and minA PT Short Term Goal 4 - Progress (Week 1): Met PT Short Term Goal 5 (Week 1): Pt will maintain dynamic standing balance  during functional task with min Ax1person PT Short Term Goal 5 - Progress (Week 1): Met  Week 2: STG = LTG due to estimated length of stay  Therapy Documentation Precautions:  Precautions Precautions: Fall Restrictions Weight Bearing Restrictions: No  See FIM for current functional status  Carlos Levering 06/02/2014, 3:46 PM

## 2014-06-03 ENCOUNTER — Inpatient Hospital Stay (HOSPITAL_COMMUNITY): Payer: Medicare Other | Admitting: *Deleted

## 2014-06-03 ENCOUNTER — Inpatient Hospital Stay (HOSPITAL_COMMUNITY): Payer: Medicare Other | Admitting: Speech Pathology

## 2014-06-03 ENCOUNTER — Encounter (HOSPITAL_COMMUNITY): Payer: Medicare Other

## 2014-06-03 NOTE — Progress Notes (Signed)
Occupational Therapy Weekly Progress Note  Patient Details  Name: Geoffrey Neal MRN: [id number] Date of Birth: [birth year]  Beginning of progress report period: May 26, 2014 End of progress report period: June 03, 2014     Patient has met 4 of 4 short term goals.  Pt has progressed well with BADLs since admission.  Pt currently requires occasional steady A with functional ambulation and when practicing stepping over into tub.  Pt has agreed that a tub transfer bench is the best option for showering and will have son install grab bars in tub.  Pt is currently close supervision for BADLs with occasional min A for higher level balance tasks.  Pt continues to require min verbal cues for safety awareness.  Pt's wife has been present for therapy sessions.  Patient continues to demonstrate the following deficits: decreased awareness of deficits, LLE weakness, impaired problem solving and sequencing and therefore will continue to benefit from skilled OT intervention to enhance overall performance with BADL and Reduce care partner burden.  Patient progressing toward long term goals..  Continue plan of care.  OT Short Term Goals Week 1:  OT Short Term Goal 1 (Week 1): Pt will complete LB dressing with min A OT Short Term Goal 1 - Progress (Week 1): Met OT Short Term Goal 2 (Week 1): Pt will completed self-care task in standing for 1 min with min A for balance OT Short Term Goal 2 - Progress (Week 1): Met OT Short Term Goal 3 (Week 1): Pt will complete functional transfer with min A and min cues for positioning of LLE OT Short Term Goal 3 - Progress (Week 1): Met OT Short Term Goal 4 (Week 1): Pt will complete bathing with min A and min cues for initiation and sequencing OT Short Term Goal 4 - Progress (Week 1): Met Week 2:  OT Short Term Goal 1 (Week 2): LTG=STG secondary to ELOS   Therapy Documentation Precautions:  Precautions Precautions: Fall Restrictions Weight Bearing  Restrictions: No  See FIM for current functional status   Leroy Libman 06/03/2014, 6:51 AM

## 2014-06-03 NOTE — Progress Notes (Signed)
[place] [facility]ICINE & REHABILITATION     PROGRESS NOTE    Subjective/Complaints: No issues.   Objective: Vital Signs: Blood pressure 116/75, pulse 72, temperature 97.6 F (36.4 C), temperature source Oral, resp. rate 19, height 5\' 7"  (1.702 m), weight 88.5 kg (195 lb 1.7 oz), SpO2 94 %. No results found. No results for input(s): WBC, HGB, HCT, PLT in the last 72 hours. No results for input(s): NA, K, CL, GLUCOSE, BUN, CREATININE, CALCIUM in the last 72 hours.  Invalid input(s): CO CBG (last 3)  No results for input(s): GLUCAP in the last 72 hours.  Wt Readings from Last 3 Encounters:  05/30/14 88.5 kg (195 lb 1.7 oz)  05/07/14 95.7 kg (210 lb 15.7 oz)  08/17/13 93.895 kg (207 lb)    Physical Exam:  HENT: oral mucosa pink, dentition poor Cranioplasty site clean and dry with staples intact  Eyes:  Pupils round and reactive to light without nystagmus  Neck: Normal range of motion. Neck supple. No thyromegaly present.  Cardiovascular: Normal rate and regular rhythm. no murmurs or rubs or gallops Respiratory: Effort normal and breath sounds normal. No respiratory distress. No wheezes GI: Soft. Bowel sounds are normal. He exhibits no distension.  Neurological: no focal CN abnormalities. Tracks to all fields. No tongue or facial deviation Patient is alert and makes good eye contact with examiner. He does follow simple commands. UES: 4/5 delt,tric,bic,wrist, HI. Mild left pronator drift. LES: 3+/5 HF, 4- KE and ADF/APF. .  Skin: Skin is warm and dry.  Psych: mood pleasant and cooperative  Assessment/Plan: 1. Functional deficits secondary to meningioma s/p resection/cranioplasty which require 3+ hours per day of interdisciplinary therapy in a comprehensive inpatient rehab setting. Physiatrist is providing close team supervision and 24 hour management of active medical problems listed below. Physiatrist and rehab team continue to assess barriers to discharge/monitor  patient progress toward functional and medical goals.  Spent extensive time with patient and translator regarding prognosis, follow up care, safety issues, family involvement, etc. Patient expressed an understanding and stated he was willing to cede to family/wife control  until he was able to be independent on his own. Pt was very appreciative of time spent.     FIM: FIM - Bathing Bathing Steps Patient Completed: Chest, Right Arm, Left Arm, Abdomen, Front perineal area, Right upper leg, Left upper leg, Right lower leg (including foot), Buttocks, Left lower leg (including foot) Bathing: 5: Supervision: Safety issues/verbal cues  FIM - Upper Body Dressing/Undressing Upper body dressing/undressing steps patient completed: Thread/unthread right sleeve of pullover shirt/dresss, Thread/unthread left sleeve of pullover shirt/dress, Put head through opening of pull over shirt/dress, Pull shirt over trunk Upper body dressing/undressing: 5: Supervision: Safety issues/verbal cues FIM - Lower Body Dressing/Undressing Lower body dressing/undressing steps patient completed: Thread/unthread right underwear leg, Thread/unthread left underwear leg, Pull underwear up/down, Thread/unthread right pants leg, Thread/unthread left pants leg, Pull pants up/down, Don/Doff left sock, Don/Doff right shoe, Don/Doff right sock, Fasten/unfasten right shoe, Fasten/unfasten left shoe, Don/Doff left shoe Lower body dressing/undressing: 4: Steadying Assist  FIM - Toileting Toileting steps completed by patient: Adjust clothing prior to toileting, Performs perineal hygiene, Adjust clothing after toileting Toileting Assistive Devices: Grab bar or rail for support Toileting: 5: Supervision: Safety issues/verbal cues  FIM - Radio producer Devices: Grab bars Toilet Transfers: 5-From toilet/BSC: Supervision (verbal cues/safety issues)  FIM - Control and instrumentation engineer Devices:  Arm rests Bed/Chair Transfer: 5: Bed > Chair or W/C: Supervision (verbal  cues/safety issues), 5: Chair or W/C > Bed: Supervision (verbal cues/safety issues)  FIM - Locomotion: Wheelchair Distance: 170 Locomotion: Wheelchair: 0: Activity did not occur FIM - Locomotion: Ambulation Locomotion: Ambulation Assistive Devices: Other (comment) (None) Ambulation/Gait Assistance: 4: Min guard, 4: Min assist, 5: Supervision Locomotion: Ambulation: 4: Travels 150 ft or more with minimal assistance (Pt.>75%)  Comprehension Comprehension Mode: Auditory Comprehension: 4-Understands basic 75 - 89% of the time/requires cueing 10 - 24% of the time  Expression Expression Mode: Verbal Expression: 5-Expresses complex 90% of the time/cues < 10% of the time  Social Interaction Social Interaction: 5-Interacts appropriately 90% of the time - Needs monitoring or encouragement for participation or interaction.  Problem Solving Problem Solving: 4-Solves basic 75 - 89% of the time/requires cueing 10 - 24% of the time  Memory Memory: 4-Recognizes or recalls 75 - 89% of the time/requires cueing 10 - 24% of the time  Medical Problem List and Plan: 1. Functional deficits secondary to bilateral frontoparietal craniectomy for tumor excision of meningioma 05/08/2014 with cranioplasty 05/22/2014- dc staples today 2. DVT Prophylaxis/Anticoagulation: Subcutaneous heparin. Monitor platelet counts of any signs of bleeding 3. Pain Management: Hydrocodone as needed. Monitor with increased mobility 4. Dysphagia. Dysphagia 2 thin liquid diet. Follow-up speech therapy 5. Neuropsych: This patient is capable of making decisions on his own behalf. 6. Skin/Wound Care: skin intact 7. Fluids/Electrolytes/Nutrition: good po intake 8. Seizure prophylaxis. Keppra 500 mg twice a day.  9. Hypothyroidism. Synthroid 10. Urine retention: improved 11. Stooling: dc senna  LOS (Days) 9 A FACE TO FACE EVALUATION WAS  PERFORMED  Callie Facey T 06/03/2014 8:38 AM

## 2014-06-03 NOTE — Progress Notes (Signed)
Physical Therapy Session Note  Patient Details  Name: Geoffrey Neal MRN: [id number] Date of Birth: 01/24/1946  Today's Date: 06/03/2014 PT Individual Time: 0900-1030 PT Individual Time Calculation (min): 90 min   Short Term Goals: Week 2:  PT Short Term Goal 1 (Week 2): LTG = STG due to anticipated length of stay  Skilled Therapeutic Interventions/Progress Updates:  Patient received supine in bed with interpreter present. Session focused on functional mobility, high level balance, L LE NMR, cognitive remediation, emergent and anticipatory awareness, and planned failures. Patient performing all functional transfers at supervision level today, gait training >150' x2 without AD with close supervision, min guard required with increased fatigue and when patient turning head to observe people/obstacles in halls; verbal cues for increased gait speed. Stair negotiation x12 steps with R handrail and close supervision ascending, L handrail and min guard descending with one rest break required mid-descent secondary to patient attempting to descend with improper sequence, requiring minA.  High Level Balance:  -gait with kicking yoga block x200' -gait with bouncing physioball x200' -gait with increased speed x200' -standing LE number taps with increasing amount of numbers in sequence -half kneeling with varying levels of UE support progressing to ball toss (see below for attempted combined cognitive activity) -lateral side stepping with blue theraband   NMR: -L LE anterior and lateral step ups without UE support and min guard-maxA with LOB -R LE forward/retro step up and over to facilitate R LE strengthening and L LE NMR to increase L hip/knee flexion and L DF to clear step  Cognitive remediation activities added to physical tasks throughout session (in half kneeling ball toss combined with naming food in alphabetical order with each catch or naming food in alphabetical order during gait while bouncing  ball), with patient having to stop physical task and requiring increased time to complete cognitive task with mod-max cues to complete correctly. Throughout session, patient terminating certain activities with failure or LOB, but able to be encouraged to continue after rest breaks. Several times, patient admitting to difficulty with tasks and even asking therapist to "hold me please" during activities with increased LOB, demonstrating improved emergent awareness. Upon demonstrating certain activities, patient initially refusing to perform, stating he would "fall over" or it would be "too hard", demonstrating basic anticipatory awareness.  Patient returned to room and left sitting EOB, awaiting arrival of SLP; wife and interpreter present  Therapy Documentation Precautions:  Precautions Precautions: Fall Precaution Comments: Bifrontal craniectomy--NO SKULL FLAP Restrictions Weight Bearing Restrictions: No Pain: Pain Assessment Pain Assessment: No/denies pain Pain Score: 0-No pain Locomotion : Ambulation Ambulation/Gait Assistance: 5: Supervision;4: Min guard   See FIM for current functional status  Therapy/Group: Individual Therapy  Lillia Abed. Minta Fair, PT, DPT 06/03/2014, 10:34 AM

## 2014-06-03 NOTE — Progress Notes (Signed)
Occupational Therapy Session Note  Patient Details  Name: Geoffrey Neal MRN: [id number] Date of Birth: 09/05/1945  Today's Date: 06/03/2014 OT Individual Time: 1100-1200 OT Individual Time Calculation (min): 60 min    Short Term Goals: Week 1:  OT Short Term Goal 1 (Week 1): Pt will complete LB dressing with min A OT Short Term Goal 1 - Progress (Week 1): Met OT Short Term Goal 2 (Week 1): Pt will completed self-care task in standing for 1 min with min A for balance OT Short Term Goal 2 - Progress (Week 1): Met OT Short Term Goal 3 (Week 1): Pt will complete functional transfer with min A and min cues for positioning of LLE OT Short Term Goal 3 - Progress (Week 1): Met OT Short Term Goal 4 (Week 1): Pt will complete bathing with min A and min cues for initiation and sequencing OT Short Term Goal 4 - Progress (Week 1): Met Week 2:  OT Short Term Goal 1 (Week 2): LTG=STG secondary to ELOS  Skilled Therapeutic Interventions/Progress Updates:    Pt resting in bed with wife and interpreter present.  Pt stated he was very tired from earlier PT session but still wanted to take shower.  Pt completed all bathing at shower level and dressing with sit<>stand from chair.  Pt required steady A X 1 with minimal LOB when transferring from shower.  Pt completed grooming tasks standing at sink before returning to bed.  Pt did not converse much during this session secondary to fatigue.  Pt asked about something to sit on in shower we going home.  I reminded patient that I had ordered a tub bench for his use at home.  Pt excited about going home on Saturday but verbalized understanding that he is not to shower or move around without someone being present.  Focus on activity tolerance, sit<>stand, standing balance, functional amb without AD, and safety awareness.  Therapy Documentation Precautions:  Precautions Precautions: Fall Precaution Comments: Bifrontal craniectomy--NO SKULL FLAP Restrictions Weight  Bearing Restrictions: No   Pain: Pain Assessment Pain Assessment: No/denies pain Pain Score: 0-No pain  See FIM for current functional status  Therapy/Group: Individual Therapy  Leroy Libman 06/03/2014, 12:17 PM

## 2014-06-03 NOTE — Progress Notes (Signed)
Speech Language Pathology Daily Session Note  Patient Details  Name: Geoffrey Neal MRN: [id number] Date of Birth: [birth year]  Today's Date: 06/03/2014 SLP Individual Time: 1030-1100 SLP Individual Time Calculation (min): 30 min  Short Term Goals: Week 2: SLP Short Term Goal 1 (Week 2): Patient will demonstrate functional problem solving during basic and familiar tasks with Supervision A multimodal cues. SLP Short Term Goal 2 (Week 2): Patient will identify 2 physical and 2 cognitive deficits with Min A multimodal cues.  SLP Short Term Goal 3 (Week 2): Patient will self-monitor and correct errors during completion of functional tasks with Min A multimodal cues. SLP Short Term Goal 4 (Week 2): Patient will recall new, daily information with Supervision multimodal cues. SLP Short Term Goal 5 (Week 2): Patient will demonstrate selective attention in a mildly distracting environment for 60 minutes with Min A multimodal cues for redirection.  SLP Short Term Goal 6 (Week 2): Patient will utilize call bell to request assistance with Supervision A multimodal cues.   Skilled Therapeutic Interventions: Skilled treatment session with patient's wife and translator present focused on cognitive goals. Student facilitated session with Mod A multimodal cues for recall during moderately complex memory association task. Patient required Min A multimodal cues to sustain attention for ~3 minutes in a moderately distracting environment. Patient was Total A multimodal cues for emerging awareness of deficits. Per translator, patient requires multiple requests for repetitions of the same information, indicating difficulty with short term memory. Patient left in room in wheelchair with quick-release belt in place and wife present. Continue with current plan of care.   FIM:  Comprehension Comprehension Mode: Auditory Comprehension: 4-Understands basic 75 - 89% of the time/requires cueing 10 - 24% of the  time Expression Expression Mode: Verbal Expression: 5-Expresses basic 90% of the time/requires cueing < 10% of the time. Social Interaction Social Interaction: 5-Interacts appropriately 90% of the time - Needs monitoring or encouragement for participation or interaction. Problem Solving Problem Solving: 4-Solves basic 75 - 89% of the time/requires cueing 10 - 24% of the time Memory Memory: 3-Recognizes or recalls 50 - 74% of the time/requires cueing 25 - 49% of the time  Pain    Therapy/Group: Individual Therapy  Servando Snare 06/03/2014, 3:05 PM

## 2014-06-03 NOTE — Progress Notes (Signed)
SLP Cancellation Note (late entry)  Patient Details Name: Geoffrey Neal MRN: [id number] DOB: 07-20-1945   Cancelled treatment:        Pt missed 60 minutes of scheduled therapy time due to scheduling error.                                                                                             Germain Osgood, M.A. CCC-SLP [phone]  Germain Osgood 06/03/2014, 2:50 PM

## 2014-06-04 ENCOUNTER — Inpatient Hospital Stay (HOSPITAL_COMMUNITY): Payer: Medicare Other | Admitting: Speech Pathology

## 2014-06-04 ENCOUNTER — Inpatient Hospital Stay (HOSPITAL_COMMUNITY): Payer: Medicare Other

## 2014-06-04 ENCOUNTER — Encounter (HOSPITAL_COMMUNITY): Payer: Medicare Other

## 2014-06-04 NOTE — Progress Notes (Addendum)
Occupational Therapy Session Note  Patient Details  Name: Geoffrey Neal MRN: [id number] Date of Birth: 06-29-1945  Today's Date: 06/04/2014 OT Individual Time: 0900-1000 OT Individual Time Calculation (min): 60 min    Short Term Goals: Week 2:  OT Short Term Goal 1 (Week 2): LTG=STG secondary to ELOS  Skilled Therapeutic Interventions/Progress Updates:    Pt seated in w/c upon arrival.  Interpretor present. Pt stated he did not want to shower this morning and preferred to wash up at sink and shave.  Pt requested to use toilet prior to bathing and amb into bathroom with supervision.  Pt completed all toileting tasks with supervision.  Pt completed bathing and grooming tasks standing at sink except to bathe feet (seated).  Pt did not experience any LOB during session.  Continued education regarding recommendation for supervision during bathing and dressing tasks and when walking.  Pt and wife verbalized understanding.  Focus on standing balance, functional amb without AD, activity tolerance, and safety awareness.  Therapy Documentation Precautions:  Precautions Precautions: Fall Precaution Comments: Bifrontal craniectomy--NO SKULL FLAP Restrictions Weight Bearing Restrictions: No Pain: Pain Assessment Pain Assessment: No/denies pain  See FIM for current functional status  Therapy/Group: Individual Therapy  Leroy Libman 06/04/2014, 10:01 AM

## 2014-06-04 NOTE — Progress Notes (Signed)
Speech Language Pathology Daily Session Note  Patient Details  Name: Geoffrey Neal MRN: [id number] Date of Birth: 14-Feb-1946  Today's Date: 06/04/2014 SLP Individual Time: 1000-1030 SLP Individual Time Calculation (min): 30 min  Short Term Goals: Week 2: SLP Short Term Goal 1 (Week 2): Patient will demonstrate functional problem solving during basic and familiar tasks with Supervision A multimodal cues. SLP Short Term Goal 2 (Week 2): Patient will identify 2 physical and 2 cognitive deficits with Min A multimodal cues.  SLP Short Term Goal 3 (Week 2): Patient will self-monitor and correct errors during completion of functional tasks with Min A multimodal cues. SLP Short Term Goal 4 (Week 2): Patient will recall new, daily information with Supervision multimodal cues. SLP Short Term Goal 5 (Week 2): Patient will demonstrate selective attention in a mildly distracting environment for 60 minutes with Min A multimodal cues for redirection.  SLP Short Term Goal 6 (Week 2): Patient will utilize call bell to request assistance with Supervision A multimodal cues.   Skilled Therapeutic Interventions: Skilled treatment session with patient's spouse and interpreter present focused on cognitive goals. Student facilitated session with cognitive assessment task evaluating attention, working memory, and problem solving. Patient needed Supervision A multimodal cues for sustained attention in a quiet environment. Patient required Max A multimodal cues for comprehension of assessment task involving executive functioning and Min A multimodal cues for assessment tasks involving problem solving. Entire assessment not completed due to time constraints. Patient continues to verbalize poor emerging awareness of deficits, however, suspect awareness of cognitive deficits may be increasing as indicated by patient's low frustration tolerance for challenging cognitive tasks. Patient handed off to PT at end of session. Continue  with current plan of care.  FIM:  Comprehension Comprehension Mode: Auditory Comprehension: 5-Understands basic 90% of the time/requires cueing < 10% of the time Expression Expression Mode: Verbal Expression: 5-Expresses basic 90% of the time/requires cueing < 10% of the time. Social Interaction Social Interaction: 5-Interacts appropriately 90% of the time - Needs monitoring or encouragement for participation or interaction. Problem Solving Problem Solving: 4-Solves basic 75 - 89% of the time/requires cueing 10 - 24% of the time Memory Memory: 3-Recognizes or recalls 50 - 74% of the time/requires cueing 25 - 49% of the time  Pain Pain Assessment Pain Assessment: 0-10  Therapy/Group: Individual Therapy  Servando Snare 06/04/2014, 1:02 PM

## 2014-06-04 NOTE — Progress Notes (Signed)
Social Work Patient ID: Geoffrey Neal, male   DOB: 06-24-1945, 69 y.o.   MRN: [id number]   Geoffrey Mesi Gerry Heaphy, LCSW Social Worker Signed  Patient Care Conference 06/04/2014 10:44 AM    Expand All Collapse All   Inpatient RehabilitationTeam Conference and Plan of Care Update Date: 06/02/2014   Time: 2:55 PM     Patient Name: Geoffrey Neal       Medical Record Number: [id number]  Date of Birth: 1946-04-14 Sex: Male         Room/Bed: 4W20C/4W20C-01 Payor Info: Payor: MEDICARE / Plan: MEDICARE PART A AND B / Product Type: *No Product type* /    Admitting Diagnosis: TUMOR RESECTION AND CRANIOPLASTY  Admit Date/Time:  05/25/2014  3:00 PM Admission Comments: No comment available   Primary Diagnosis:  <principal problem not specified> Principal Problem: <principal problem not specified>    Patient Active Problem List     Diagnosis  Date Noted   .  Paraparesis  05/30/2014   .  Meningioma     .  Brain tumor  05/07/2014   .  CONSTIPATION  11/16/2009   .  HYPERLIPIDEMIA, MIXED  09/29/2008   .  HYPERGLYCEMIA  09/29/2008   .  ROSACEA  04/02/2008   .  GERD  02/18/2007   .  VITILIGO  02/11/2007   .  HYPOTHYROIDISM  02/04/2007   .  DEGENERATIVE JOINT DISEASE  02/04/2007   .  ARTHRALGIA  02/04/2007     Expected Discharge Date: Expected Discharge Date: 06/06/14  Team Members Present: Physician leading conference: Dr. Alger Simons Social Worker Present: Lennart Pall, LCSW Nurse Present: Elliot Cousin, RN PT Present: Otis Brace, PT;Bridgett Ripa, PT OT Present: Roanna Epley, [place], OT SLP Present: Gunnar Fusi, SLP PPS Coordinator present : Daiva Nakayama, RN, CRRN        Current Status/Progress  Goal  Weekly Team Focus   Medical     staples out. still with safety issues---needs supervision  finalize dc planning  pain, wound, safety   Bowel/Bladder     Continent of bowel and bladder; LBM 2/8; +UTI- started on cipro 2/8  Mod I  Assess and treat for constipation as needed     Swallow/Nutrition/ Hydration     Regular diet- tolerating well         ADL's     bathing/dressing-steady A; functional transfers-steady A; decreased safety awareness  supervision overall   functional transfers, standing balance, safety awareness, activity tolerance   Mobility     Pt is currently at S for bed mobility, sitting balance, supervision-minA for standing balance, transfers, ambulation without AD, and stair negotiation. Pt continues to demonstrate poor intellectual awareness/denial regarding impairments.  Supervision for all functional transfers and mobility   Functional endurance, strength, intellectual and saftey awareness, overall functional transfers and mobility, stair negotiation,  patient and family education.   Communication               Safety/Cognition/ Behavioral Observations    Min A  Supervision  functional problem solving, awareness and working memory     Pain     C/o headach at times; po prn vicodin in use  < 4  Assess and treat for pain q shift and prn   Skin     Incision to head- staples to be removed; no skin breakdown or infection noted  Min assist  Assess skin q shift and prn    Rehab Goals Patient on target to meet rehab goals:  Yes Rehab Goals Revised: None *See Care Plan and progress notes for long and short-term goals.    Barriers to Discharge:  see prior     Possible Resolutions to Barriers:   family ed, pt ed regarding safety neeed for supervision at home      Discharge Planning/Teaching Needs:   Pt plans to return home with his wife present for 24/7 supervision.  Wife is present for every therapy session and therapists have been educating her along the way via the interpreter.    Team Discussion:    Pt is doing well overall, although safety continues to be a concern.  MD plans to discuss this with the pt on 06-03-14 via the interpreter, as pt disregards anything the wife tries to direct.  Team feels pt will reach supervision goals by 06-06-14.   Pt will need a tub bench and PT/OT/ST at home.   Revisions to Treatment Plan:    None, beside moving d/c date up.    Continued Need for Acute Rehabilitation Level of Care: The patient requires daily medical management by a physician with specialized training in physical medicine and rehabilitation for the following conditions: Daily direction of a multidisciplinary physical rehabilitation program to ensure safe treatment while eliciting the highest outcome that is of practical value to the patient.: Yes Daily medical management of patient stability for increased activity during participation in an intensive rehabilitation regime.: Yes Daily analysis of laboratory values and/or radiology reports with any subsequent need for medication adjustment of medical intervention for : Post surgical problems;Neurological problems  Reta Norgren, Geoffrey Mesi 06/04/2014, 10:44 AM

## 2014-06-04 NOTE — Patient Care Conference (Signed)
Inpatient RehabilitationTeam Conference and Plan of Care Update Date: 06/02/2014   Time: 2:55 PM    Patient Name: Geoffrey Neal      Medical Record Number: [id number]  Date of Birth: 06-11-1945 Sex: Male         Room/Bed: 4W20C/4W20C-01 Payor Info: Payor: MEDICARE / Plan: MEDICARE PART A AND B / Product Type: *No Product type* /    Admitting Diagnosis: TUMOR RESECTION AND CRANIOPLASTY  Admit Date/Time:  05/25/2014  3:00 PM Admission Comments: No comment available   Primary Diagnosis:  <principal problem not specified> Principal Problem: <principal problem not specified>  Patient Active Problem List   Diagnosis Date Noted  . Paraparesis 05/30/2014  . Meningioma   . Brain tumor 05/07/2014  . CONSTIPATION 11/16/2009  . 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 HYPERGLYCEMIA 09/29/2008  . 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 GERD 02/18/2007  . VITILIGO 02/11/2007  . HYPOTHYROIDISM 02/04/2007  . DEGENERATIVE JOINT DISEASE 02/04/2007  . ARTHRALGIA 02/04/2007    Expected Discharge Date: Expected Discharge Date: 06/06/14  Team Members Present: Physician leading conference: Dr. Alger Simons Social Worker Present: Lennart Pall, LCSW Nurse Present: Elliot Cousin, RN PT Present: Otis Brace, PT;Bridgett Ripa, PT OT Present: Roanna Epley, [place], OT SLP Present: Gunnar Fusi, SLP PPS Coordinator present : Daiva Nakayama, RN, CRRN     Current Status/Progress Goal Weekly Team Focus  Medical   staples out. still with safety issues---needs supervision  finalize dc planning  pain, wound, safety   Bowel/Bladder   Continent of bowel and bladder; LBM 2/8; +UTI- started on cipro 2/8  Mod I  Assess and treat for constipation as needed   Swallow/Nutrition/ Hydration   Regular diet- tolerating well         ADL's   bathing/dressing-steady A; functional transfers-steady A; decreased safety awareness  supervision overall   functional transfers, standing balance, safety awareness, activity tolerance    Mobility   Pt is currently at S for bed mobility, sitting balance, supervision-minA for standing balance, transfers, ambulation without AD, and stair negotiation. Pt continues to demonstrate poor intellectual awareness/denial regarding impairments.  Supervision for all functional transfers and mobility  Functional endurance, strength, intellectual and saftey awareness, overall functional transfers and mobility, stair negotiation,  patient and family education.   Communication             Safety/Cognition/ Behavioral Observations  Min A  Supervision  functional problem solving, awareness and working memory    Pain   C/o headach at times; po prn vicodin in use  < 4  Assess and treat for pain q shift and prn   Skin   Incision to head- staples to be removed; no skin breakdown or infection noted  Min assist  Assess skin q shift and prn    Rehab Goals Patient on target to meet rehab goals: Yes Rehab Goals Revised: None *See Care Plan and progress notes for long and short-term goals.  Barriers to Discharge: see prior    Possible Resolutions to Barriers:  family ed, pt ed regarding safety neeed for supervision at home    Discharge Planning/Teaching Needs:  Pt plans to return home with his wife present for 24/7 supervision.  Wife is present for every therapy session and therapists have been educating her along the way via the interpreter.   Team Discussion:  Pt is doing well overall, although safety continues to be a concern.  MD plans to discuss this with the pt on 06-03-14 via the interpreter, as pt disregards  anything the wife tries to direct.  Team feels pt will reach supervision goals by 06-06-14.  Pt will need a tub bench and PT/OT/ST at home.  Revisions to Treatment Plan:  None, beside moving d/c date up.   Continued Need for Acute Rehabilitation Level of Care: The patient requires daily medical management by a physician with specialized training in physical medicine and  rehabilitation for the following conditions: Daily direction of a multidisciplinary physical rehabilitation program to ensure safe treatment while eliciting the highest outcome that is of practical value to the patient.: Yes Daily medical management of patient stability for increased activity during participation in an intensive rehabilitation regime.: Yes Daily analysis of laboratory values and/or radiology reports with any subsequent need for medication adjustment of medical intervention for : Post surgical problems;Neurological problems  Elizabet Schweppe, Silvestre Mesi 06/04/2014, 10:44 AM

## 2014-06-04 NOTE — Progress Notes (Signed)
[facility] PHYSICAL MEDICINE & REHABILITATION     PROGRESS NOTE    Subjective/Complaints: No complaints, feeling well  Objective: Vital Signs: Blood pressure 96/56, pulse 84, temperature 98.3 F (36.8 C), temperature source Oral, resp. rate 18, height 5\' 7"  (1.702 m), weight 88.3 kg (194 lb 10.7 oz), SpO2 98 %. No results found. No results for input(s): WBC, HGB, HCT, PLT in the last 72 hours. No results for input(s): NA, K, CL, GLUCOSE, BUN, CREATININE, CALCIUM in the last 72 hours.  Invalid input(s): CO CBG (last 3)  No results for input(s): GLUCAP in the last 72 hours.  Wt Readings from Last 3 Encounters:  06/03/14 88.3 kg (194 lb 10.7 oz)  05/07/14 95.7 kg (210 lb 15.7 oz)  08/17/13 93.895 kg (207 lb)    Physical Exam:  HENT: oral mucosa pink, dentition poor Cranioplasty site clean and dry with staples intact  Eyes:  Pupils round and reactive to light without nystagmus  Neck: Normal range of motion. Neck supple. No thyromegaly present.  Cardiovascular: Normal rate and regular rhythm. no murmurs or rubs or gallops Respiratory: Effort normal and breath sounds normal. No respiratory distress. No wheezes GI: Soft. Bowel sounds are normal. He exhibits no distension.  Neurological: no focal CN abnormalities. Tracks to all fields. No tongue or facial deviation Patient is alert and makes good eye contact with examiner. He does follow simple commands. UES: 4/5 delt,tric,bic,wrist, HI. Mild left pronator drift. LES: 3+/5 HF, 4- KE and ADF/APF. .  Skin: Skin is warm and dry.  Psych: mood pleasant and cooperative  Assessment/Plan: 1. Functional deficits secondary to meningioma s/p resection/cranioplasty which require 3+ hours per day of interdisciplinary therapy in a comprehensive inpatient rehab setting. Physiatrist is providing close team supervision and 24 hour management of active medical problems listed below. Physiatrist and rehab team continue to assess barriers to  discharge/monitor patient progress toward functional and medical goals.      FIM: FIM - Bathing Bathing Steps Patient Completed: Chest, Right Arm, Left Arm, Abdomen, Front perineal area, Right upper leg, Left upper leg, Right lower leg (including foot), Buttocks, Left lower leg (including foot) Bathing: 5: Supervision: Safety issues/verbal cues  FIM - Upper Body Dressing/Undressing Upper body dressing/undressing steps patient completed: Thread/unthread right sleeve of pullover shirt/dresss, Thread/unthread left sleeve of pullover shirt/dress, Put head through opening of pull over shirt/dress, Pull shirt over trunk Upper body dressing/undressing: 5: Supervision: Safety issues/verbal cues FIM - Lower Body Dressing/Undressing Lower body dressing/undressing steps patient completed: Thread/unthread right underwear leg, Thread/unthread left underwear leg, Pull underwear up/down, Thread/unthread right pants leg, Thread/unthread left pants leg, Pull pants up/down, Don/Doff left sock, Don/Doff right shoe, Don/Doff right sock, Fasten/unfasten right shoe, Fasten/unfasten left shoe, Don/Doff left shoe Lower body dressing/undressing: 4: Steadying Assist  FIM - Toileting Toileting steps completed by patient: Adjust clothing prior to toileting, Performs perineal hygiene, Adjust clothing after toileting Toileting Assistive Devices: Grab bar or rail for support Toileting: 5: Supervision: Safety issues/verbal cues  FIM - Radio producer Devices: Product manager Transfers: 5-To toilet/BSC: Supervision (verbal cues/safety issues), 5-From toilet/BSC: Supervision (verbal cues/safety issues)  FIM - Control and instrumentation engineer Devices: Arm rests Bed/Chair Transfer: 5: Bed > Chair or W/C: Supervision (verbal cues/safety issues), 5: Chair or W/C > Bed: Supervision (verbal cues/safety issues), 5: Supine > Sit: Supervision (verbal cues/safety issues)  FIM -  Locomotion: Wheelchair Distance: 170 Locomotion: Wheelchair: 0: Activity did not occur FIM - Locomotion: Ambulation Locomotion: Ambulation Assistive Devices:  Other (comment) (none) Ambulation/Gait Assistance: 5: Supervision, 4: Min guard Locomotion: Ambulation: 4: Travels 150 ft or more with minimal assistance (Pt.>75%)  Comprehension Comprehension Mode: Auditory Comprehension: 4-Understands basic 75 - 89% of the time/requires cueing 10 - 24% of the time  Expression Expression Mode: Verbal Expression: 5-Expresses basic 90% of the time/requires cueing < 10% of the time.  Social Interaction Social Interaction: 5-Interacts appropriately 90% of the time - Needs monitoring or encouragement for participation or interaction.  Problem Solving Problem Solving: 4-Solves basic 75 - 89% of the time/requires cueing 10 - 24% of the time  Memory Memory: 3-Recognizes or recalls 50 - 74% of the time/requires cueing 25 - 49% of the time  Medical Problem List and Plan: 1. Functional deficits secondary to bilateral frontoparietal craniectomy for tumor excision of meningioma 05/08/2014 with cranioplasty 05/22/2014- dc staples today 2. DVT Prophylaxis/Anticoagulation: Subcutaneous heparin. Monitor platelet counts of any signs of bleeding 3. Pain Management: Hydrocodone as needed. Monitor with increased mobility 4. Dysphagia. Dysphagia 2 thin liquid diet. Follow-up speech therapy 5. Neuropsych: This patient is capable of making decisions on his own behalf. 6. Skin/Wound Care: skin intact 7. Fluids/Electrolytes/Nutrition: good po intake 8. Seizure prophylaxis. Keppra 500 mg twice a day.  9. Hypothyroidism. Synthroid 10. Urine retention: improved 11. Stooling: dc senna  LOS (Days) 10 A FACE TO FACE EVALUATION WAS PERFORMED  Bartt Gonzaga T 06/04/2014 8:08 AM

## 2014-06-04 NOTE — Discharge Instructions (Signed)
Inpatient Rehab Discharge Instructions  Geoffrey Neal Discharge date and time: No discharge date for patient encounter.   Activities/Precautions/ Functional Status: Activity: activity as tolerated Diet: regular diet Wound Care: keep wound clean and dry Functional status:  ___ No restrictions     ___ Walk up steps independently _x__ 24/7 supervision/assistance   ___ Walk up steps with assistance ___ Intermittent supervision/assistance  ___ Bathe/dress independently ___ Walk with walker     ___ Bathe/dress with assistance ___ Walk Independently    ___ Shower independently _x__ Walk with assistance    ___ Shower with assistance ___ No alcohol     ___ Return to work/school ________  COMMUNITY REFERRALS UPON DISCHARGE:   Home Health:   PT     OT     ST      Agency:  [facility] Phone:  [phone] Medical Equipment/Items Ordered:  Tub bench  Agency/Supplier:  [facility]     Phone:  [phone]  Special Instructions:    My questions have been answered and I understand these instructions. I will adhere to these goals and the provided educational materials after my discharge from the hospital.  Patient/Caregiver Signature _______________________________ Date __________  Clinician Signature _______________________________________ Date __________  Please bring this form and your medication list with you to all your follow-up doctor's appointments.

## 2014-06-04 NOTE — Progress Notes (Signed)
Physical Therapy Session Note  Patient Details  Name: Geoffrey Neal MRN: [id number] Date of Birth: 1946/03/04  Today's Date: 06/04/2014 PT Individual Time: 1030-1200 PT Individual Time Calculation (min): 90 min   Short Term Goals: Week 2:  PT Short Term Goal 1 (Week 2): LTG = STG due to anticipated length of stay  Skilled Therapeutic Interventions/Progress Updates: 1:1. Pt received seated EOB with SLP student present and interpreter and wife in room; ready and willing to participate in physical therapy. Interpreter present throughout session. Session focused on functional ambulation and transfers, formal balance re-assessment, floor and furniture transfers, high level balance activities and treadmill gait training. Pt ambulated in controlled environment 500'x1 and 250'x1 with no AD and overall supervision. TUG was reassessed and Berg Balance test was performed. Results noted below (Berg=50, TUG = 13 sec) indicating continued overall moderate risk for falls but demonstrating significant improvements since initial evaluation period (Berg = unable to perform, TUG = 155 sec). Bed mobility and furniture transfers performed in simulated therapy apartment with overall supervision; pt demonstrating continued very mild L LE inattention (i.e leaving L LE partially hanging off bed but immediatly corrected once prompted with min verbal cue). Pt performed both car and floor transfer with overall supervision; pt received education and verbalized understanding of safe techniques for both transfer types and how to proceed if fall occurs.  High level balance activities performed including: 30'x3 tandem walking, 30'x4 grapevine with various levels of assistance - pt performed high level tasks well with stabilization (holding on to therapist arm or two fingers on rail) however, significant decreases in performance quality noted when pt attempted tasks without external support further emphasizing need for 24/7 supervision  upon d/c, pt and wife demonstrated understanding. Inverted Bosu standing with mod A to achieve positioning and hands on wall for support with pt reports of task being very difficult, unable to stand on Bosu for >10 sec without UE support on wall or minA from therapist.  Ambulation on treadmill x10 min at 1.2-1.31mph for a total of 1,185ft with emphasis on increased gait speed, decreased use of B UE for support, increased L foot clearance, and incorporating arm swing. Pt tolerated activity well with min guard and min-mod cues for speed/foot clearance but demonstrated increasing fatigue as activity progressed.  Heavy emphasis this session on pt education and planned failures to improve overall intellectual/emergent awareness of residual impairments and overall safety during therapeutic activities. Reinforced pt/wife education about need for 24/7 supervision, altering home environment to decrease fall risk (i.e good lighting at night, removing throw rugs, rearranging furniture), and pt inability to drive at this time. Education was received well and pt/wife verbalized good understanding.  Pt reported 6/10 low back pain at beginning of session, however with repositioning and rest pt reported that it had resolved by end of session. Pt was returned to room, left supine in bed with bed alarm on and interpreter/wife in room.  Therapy Documentation Precautions:  Precautions Precautions: Fall Precaution Comments: Bifrontal craniectomy--NO SKULL FLAP Restrictions Weight Bearing Restrictions: No Pain: Pain Assessment Pain Assessment: 0-10 Pain Score: 6  Pain Type: Acute pain Pain Location: Back Pain Orientation: Lower Pain Descriptors / Indicators: Aching Pain Intervention(s): Rest;Repositioned;Distraction Multiple Pain Sites: No Balance: Standardized Balance Assessment Standardized Balance Assessment: Berg Balance Test;Timed Up and Go Test Berg Balance Test Sit to Stand: Able to stand without using  hands and stabilize independently Standing Unsupported: Able to stand safely 2 minutes Sitting with Back Unsupported but Feet Supported on Floor  or Stool: Able to sit safely and securely 2 minutes Stand to Sit: Sits safely with minimal use of hands Transfers: Able to transfer safely, minor use of hands Standing Unsupported with Eyes Closed: Able to stand 10 seconds safely Standing Ubsupported with Feet Together: Able to place feet together independently and stand for 1 minute with supervision From Standing, Reach Forward with Outstretched Arm: Can reach forward >12 cm safely (5") From Standing Position, Pick up Object from Floor: Able to pick up shoe, needs supervision From Standing Position, Turn to Look Behind Over each Shoulder: Looks behind from both sides and weight shifts well Turn 360 Degrees: Able to turn 360 degrees safely in 4 seconds or less Standing Unsupported, Alternately Place Feet on Step/Stool: Able to stand independently and safely and complete 8 steps in 20 seconds Standing Unsupported, One Foot in Front: Able to plae foot ahead of the other independently and hold 30 seconds Standing on One Leg: Able to lift leg independently and hold equal to or more than 3 seconds Total Score: 50 Timed Up and Go Test TUG: Normal TUG Normal TUG (seconds): 13  See FIM for current functional status  Therapy/Group: Individual Therapy  Carlos Levering 06/04/2014, 12:08 PM

## 2014-06-04 NOTE — Progress Notes (Signed)
Social Work Patient ID: [name], male   DOB: Sep 09, 1945, 69 y.o.   MRN: [id number]   CSW met with pt and wife and spoke to his dtr via telephone to update them on team conference discussion.  Pt is excited about d/c date being moved up.  CSW told pt/family that we will set up home health and order tub bench for pt.  Dtr to come in on Friday morning at 8:30 AM to hear d/c instructions from PA.  CSW informed Linna Hoff, [place] of this and arranged interpreter for this purpose.  No other needs/concerns/questions at this time.  CSW will continue to follow.

## 2014-06-05 ENCOUNTER — Inpatient Hospital Stay (HOSPITAL_COMMUNITY): Payer: Medicare Other

## 2014-06-05 ENCOUNTER — Inpatient Hospital Stay (HOSPITAL_COMMUNITY): Payer: Medicare Other | Admitting: Speech Pathology

## 2014-06-05 ENCOUNTER — Encounter (HOSPITAL_COMMUNITY): Payer: Medicare Other

## 2014-06-05 MED ORDER — TOPIRAMATE 25 MG PO TABS: 25.0000 mg | ORAL_TABLET | Freq: Every day | ORAL | Status: DC

## 2014-06-05 MED ORDER — HYDROCODONE-ACETAMINOPHEN 5-325 MG PO TABS: 1.0000 | ORAL_TABLET | ORAL | Status: DC | PRN

## 2014-06-05 MED ORDER — CIPROFLOXACIN HCL 250 MG PO TABS: 250.0000 mg | ORAL_TABLET | Freq: Two times a day (BID) | ORAL | Status: DC

## 2014-06-05 MED ORDER — LEVETIRACETAM 500 MG PO TABS: 500.0000 mg | ORAL_TABLET | Freq: Two times a day (BID) | ORAL | Status: DC

## 2014-06-05 MED ORDER — LEVOTHYROXINE SODIUM 175 MCG PO TABS: 175.0000 ug | ORAL_TABLET | Freq: Every day | ORAL | Status: AC

## 2014-06-05 MED ORDER — ACETAMINOPHEN 325 MG PO TABS: 325.0000 mg | ORAL_TABLET | ORAL | Status: AC | PRN

## 2014-06-05 NOTE — Plan of Care (Signed)
Problem: RH Wheelchair Mobility Goal: LTG Patient will propel w/c in controlled environment (PT) LTG: Patient will propel wheelchair in controlled environment, # of feet with assist (PT)  Outcome: Not Applicable Date Met:  63/87/56 Pt at functional ambulatory level Goal: LTG Patient will propel w/c in home environment (PT) LTG: Patient will propel wheelchair in home environment, # of feet with assistance (PT).  Outcome: Not Applicable Date Met:  43/32/95 Pt at functional ambulatory level

## 2014-06-05 NOTE — Progress Notes (Signed)
Occupational Therapy Discharge Summary  Patient Details  Name: Geoffrey Neal MRN: [id number] Date of Birth: 25-Oct-1945   Patient has met 67 of 11 long term goals due to improved activity tolerance, improved balance and ability to compensate for deficits.  Pt made steady progress with BADLs during this admission.  Pt is supervision for BADLs and functional transfers.  Pt's wife has been present during therapy sessions.  Pt and wife have verbalized understanding of recommendation for 24 hour supervision.  Pt continues to required verbal cues for safety approx ~10% of time.Patient to discharge at overall Supervision level.  Patient's care partner is independent to provide the necessary cognitive assistance at discharge.    Recommendation:  Patient will benefit from ongoing skilled OT services in home health setting to continue to advance functional skills in the area of BADL, iADL and Reduce care partner burden.  Equipment: Tub Producer, television/film/video  Reasons for discharge: treatment goals met and discharge from hospital  Patient/family agrees with progress made and goals achieved: Yes  OT Discharge   Vision/Perception  Vision- History Baseline Vision/History: Wears glasses Wears Glasses: Reading only Patient Visual Report: No change from baseline Vision- Assessment Vision Assessment?: Yes Eye Alignment: Within Functional Limits Ocular Range of Motion: Within Functional Limits Alignment/Gaze Preference: Within Defined Limits Tracking/Visual Pursuits: Able to track stimulus in all quads without difficulty Saccades: Within functional limits Convergence: Within functional limits Visual Fields: No apparent deficits  Cognition Overall Cognitive Status: Impaired/Different from baseline Arousal/Alertness: Awake/alert Orientation Level: Oriented X4 Attention: Sustained;Selective;Alternating Focused Attention: Appears intact Sustained Attention: Appears intact Selective Attention: Appears  intact Alternating Attention: Impaired Memory: Impaired Memory Impairment: Decreased recall of new information Awareness Impairment: Emergent impairment Problem Solving: Impaired Safety/Judgment: Impaired Sensation Sensation Light Touch: Appears Intact Hot/Cold: Appears Intact Proprioception: Appears Intact Coordination Gross Motor Movements are Fluid and Coordinated: Yes Fine Motor Movements are Fluid and Coordinated: Yes    Trunk/Postural Assessment  Cervical Assessment Cervical Assessment: Within Functional Limits Thoracic Assessment Thoracic Assessment: Within Functional Limits Lumbar Assessment Lumbar Assessment: Exceptions to WFL (decreased lordotic curve) Postural Control Postural Control: Within Functional Limits  Balance Static Sitting Balance Static Sitting - Level of Assistance: 7: Independent Dynamic Sitting Balance Dynamic Sitting - Level of Assistance: 6: Modified independent (Device/Increase time) Extremity/Trunk Assessment RUE Assessment RUE Assessment: Within Functional Limits LUE Assessment LUE Assessment: Within Functional Limits  See FIM for current functional status  Leroy Libman 06/05/2014, 8:25 AM

## 2014-06-05 NOTE — Progress Notes (Signed)
[facility] PHYSICAL MEDICINE & REHABILITATION     PROGRESS NOTE    Subjective/Complaints: Very pleased with progress. Happy to go home. Family in at 0830 today for ed  Objective: Vital Signs: Blood pressure 102/61, pulse 66, temperature 97.8 F (36.6 C), temperature source Oral, resp. rate 20, height 5\' 7"  (1.702 m), weight 88.3 kg (194 lb 10.7 oz), SpO2 95 %. No results found. No results for input(s): WBC, HGB, HCT, PLT in the last 72 hours. No results for input(s): NA, K, CL, GLUCOSE, BUN, CREATININE, CALCIUM in the last 72 hours.  Invalid input(s): CO CBG (last 3)  No results for input(s): GLUCAP in the last 72 hours.  Wt Readings from Last 3 Encounters:  06/03/14 88.3 kg (194 lb 10.7 oz)  05/07/14 95.7 kg (210 lb 15.7 oz)  08/17/13 93.895 kg (207 lb)    Physical Exam:  HENT: oral mucosa pink, dentition poor Cranioplasty site clean and dry with staples intact  Eyes:  Pupils round and reactive to light without nystagmus  Neck: Normal range of motion. Neck supple. No thyromegaly present.  Cardiovascular: Normal rate and regular rhythm. no murmurs or rubs or gallops Respiratory: Effort normal and breath sounds normal. No respiratory distress. No wheezes GI: Soft. Bowel sounds are normal. He exhibits no distension.  Neurological: no focal CN abnormalities. Tracks to all fields. No tongue or facial deviation Patient is alert and makes good eye contact with examiner. He does follow simple commands. UES: 4/5 delt,tric,bic,wrist, HI. Mild left pronator drift. LES: 3+/5 HF, 4- KE and ADF/APF. .  Skin: Skin is warm and dry.  Psych: mood pleasant and cooperative  Assessment/Plan: 1. Functional deficits secondary to meningioma s/p resection/cranioplasty which require 3+ hours per day of interdisciplinary therapy in a comprehensive inpatient rehab setting. Physiatrist is providing close team supervision and 24 hour management of active medical problems listed  below. Physiatrist and rehab team continue to assess barriers to discharge/monitor patient progress toward functional and medical goals.      FIM: FIM - Bathing Bathing Steps Patient Completed: Chest, Right Arm, Left Arm, Abdomen, Front perineal area, Right upper leg, Left upper leg, Right lower leg (including foot), Buttocks, Left lower leg (including foot) Bathing: 5: Supervision: Safety issues/verbal cues  FIM - Upper Body Dressing/Undressing Upper body dressing/undressing steps patient completed: Thread/unthread right sleeve of pullover shirt/dresss, Thread/unthread left sleeve of pullover shirt/dress, Put head through opening of pull over shirt/dress, Pull shirt over trunk Upper body dressing/undressing: 5: Supervision: Safety issues/verbal cues FIM - Lower Body Dressing/Undressing Lower body dressing/undressing steps patient completed: Thread/unthread right underwear leg, Thread/unthread left underwear leg, Pull underwear up/down, Thread/unthread right pants leg, Thread/unthread left pants leg, Pull pants up/down, Don/Doff left sock, Don/Doff right shoe, Don/Doff right sock, Fasten/unfasten right shoe, Fasten/unfasten left shoe, Don/Doff left shoe Lower body dressing/undressing: 5: Supervision: Safety issues/verbal cues  FIM - Toileting Toileting steps completed by patient: Adjust clothing prior to toileting, Performs perineal hygiene, Adjust clothing after toileting Toileting Assistive Devices: Grab bar or rail for support Toileting: 5: Supervision: Safety issues/verbal cues  FIM - Radio producer Devices: Product manager Transfers: 5-To toilet/BSC: Supervision (verbal cues/safety issues), 5-From toilet/BSC: Supervision (verbal cues/safety issues)  FIM - Control and instrumentation engineer Devices: Arm rests Bed/Chair Transfer: 5: Bed > Chair or W/C: Supervision (verbal cues/safety issues), 5: Chair or W/C > Bed: Supervision (verbal  cues/safety issues), 5: Supine > Sit: Supervision (verbal cues/safety issues), 5: Sit > Supine: Supervision (verbal cues/safety issues)  FIM - Locomotion: Wheelchair Distance: 170 Locomotion: Wheelchair: 0: Activity did not occur FIM - Locomotion: Ambulation Locomotion: Ambulation Assistive Devices: Other (comment) (None) Ambulation/Gait Assistance: 5: Supervision Locomotion: Ambulation: 5: Travels 150 ft or more with supervision/safety issues  Comprehension Comprehension Mode: Auditory Comprehension: 5-Understands basic 90% of the time/requires cueing < 10% of the time  Expression Expression Mode: Verbal Expression: 5-Expresses basic 90% of the time/requires cueing < 10% of the time.  Social Interaction Social Interaction: 5-Interacts appropriately 90% of the time - Needs monitoring or encouragement for participation or interaction.  Problem Solving Problem Solving: 4-Solves basic 75 - 89% of the time/requires cueing 10 - 24% of the time  Memory Memory: 3-Recognizes or recalls 50 - 74% of the time/requires cueing 25 - 49% of the time  Medical Problem List and Plan: 1. Functional deficits secondary to bilateral frontoparietal craniectomy for tumor excision of meningioma 05/08/2014 with cranioplasty 05/22/2014- staples out 2. DVT Prophylaxis/Anticoagulation: Subcutaneous heparin. Monitor platelet counts of any signs of bleeding 3. Pain Management: Hydrocodone as needed. Monitor with increased mobility 4. Dysphagia. Resolved---on regular diet 5. Neuropsych: This patient is capable of making decisions on his own behalf. 6. Skin/Wound Care: skin intact 7. Fluids/Electrolytes/Nutrition: good po intake 8. Seizure prophylaxis. Keppra 500 mg twice a day.  9. Hypothyroidism. Synthroid 10. Urine retention: improved   LOS (Days) 11 A FACE TO FACE EVALUATION WAS PERFORMED  Nickayla Mcinnis T 06/05/2014 7:37 AM

## 2014-06-05 NOTE — Progress Notes (Signed)
Physical Therapy Discharge Summary  Patient Details  Name: Geoffrey Neal MRN: [id number] Date of Birth: 1945/07/03  Today's Date: 06/05/2014 PT Individual Time: 1030-1200 PT Individual Time Calculation (min): 90 min   Patient has met 10 of 10 long term goals due to improved activity tolerance, improved balance, improved postural control, increased strength, functional use of  left lower extremity, improved attention, improved awareness, improved coordination and improved ability to perform functional bed mobility, transfers and ambulation.  Patient to discharge at an ambulatory level Supervision. Patient's wife is independent to provide the necessary physical and cognitive assistance at discharge.  Reasons goals not met: N/A  Recommendation:  Patient will benefit from ongoing skilled PT services in home health setting to continue to advance safe functional mobility, address ongoing impairments in memory, attention, awareness, safety, standing balance, ambulation, L LE strength and coordination, increased independence with function transfers and mobility, and minimize fall risk.  Equipment: N/A  Reasons for discharge: treatment goals met and discharge from hospital  Patient/family agrees with progress made and goals achieved: Yes   Skilled Therapeutic Interventions/Progress Updates: 1:1. Pt received supine in bed w/ interpreter and wife in room, ready and willing to participate in physical therapy. Interpreter present for first 60 minutes of session. Session focused on discharge planning, functional ambulation and transfers, stair negotiation, high level balance activities and endurance, and family education. Pt ambulated 400'x2 and 200'x4 with overall supervision and no AD, negotiated 12 stairs x2 with overall supervision and L hand rail, demonstrated numerous stand pivot transfers from various levels - including car and furniture transfers - at supervision level, and safely demonstrated  independence with bed mobility without use of hospital bed functions in simulated therapy apartment. High level balance activities were initiated including weaving around, stepping over and stepping on obstacles in an open environment with intermittent distractor's. Pt demonstrated decreased foot clearance over obstacles and occasional misjudgment of step timing with min guard, highlighting continued need for high level balance activities and continued need for supervision at home. Pt's ability to perform tasks was signigicant more impaired with distractors present; pt again educated about the need for sustained attention during even simple tasks.  NuStep exercise machine level 2 x12 minutes maintaining 35-45 RPM's. Pt returned to room and transferred to/from toilet with overall supervision and use of grab bar. Pt verbalized understanding that help was necessary to get off commode, therapist stepped out with door partially closed and returned to bathroom after hearing toilet flush to pt standing; intellectual awareness continues to appear decreased even after consistent pt education regarding deficits and continued need for overall supervision.  Pt's wife demonstrated ability to provide safe supervision for functional ambulation, stair negotiation and transfers. Although pt currently at overall supervision level, wife educated on hand positioning and minimal physical assistance for steadying if the need were to arise upon d/c. Pt and wife verbalized understanding that pt continues to need 24/7 supervision at home.  Pt left in room, supine in bed with wife present and bed alarm on.  PT Discharge Precautions/Restrictions Precautions Precautions: Fall Restrictions Weight Bearing Restrictions: No Vision/Perception  See OT discharge note Cognition Overall Cognitive Status: Impaired/Different from baseline Arousal/Alertness: Awake/alert Orientation Level: Oriented X4 Attention:  Sustained;Selective;Alternating Focused Attention: Appears intact Sustained Attention: Appears intact Selective Attention: Appears intact Selective Attention Impairment: Functional basic Alternating Attention: Impaired Alternating Attention Impairment: Verbal basic;Functional basic Memory: Impaired Memory Impairment: Decreased recall of new information Awareness: Impaired Awareness Impairment: Emergent impairment;Intellectual impairment Problem Solving: Impaired Problem Solving Impairment:  Functional basic Executive Function: Self Monitoring;Self Correcting;Reasoning Reasoning: Impaired Reasoning Impairment: Functional basic Sequencing: Appears intact Organizing: Appears intact Initiating: Appears intact Self Monitoring: Impaired Self Monitoring Impairment: Functional basic Self Correcting: Impaired Self Correcting Impairment: Functional basic Safety/Judgment: Impaired Sensation Sensation Light Touch: Appears Intact Proprioception: Appears Intact Coordination Gross Motor Movements are Fluid and Coordinated: Yes Fine Motor Movements are Fluid and Coordinated: Yes Motor  Motor Motor: Within Functional Limits;Other (comment) Motor - Discharge Observations: timing/sequencing of stepping with L LE during higher level tasks  Mobility Bed Mobility Bed Mobility: Supine to Sit;Rolling Left;Rolling Right;Sit to Supine Rolling Right: 7: Independent Rolling Left: 7: Independent Supine to Sit: 7: Independent Sit to Supine: 7: Independent Transfers Transfers: Yes Sit to Stand: With armrests;From bed;5: Supervision;With upper extremity assist Sit to Stand Details: Verbal cues for precautions/safety Stand to Sit: 5: Supervision;With armrests;To bed;With upper extremity assist Stand to Sit Details (indicate cue type and reason): Verbal cues for precautions/safety Stand Pivot Transfers: 5: Supervision Stand Pivot Transfer Details: Verbal cues for precautions/safety Locomotion   Ambulation Ambulation: Yes Ambulation/Gait Assistance: 5: Supervision Assistive device:  (None) Ambulation/Gait Assistance Details: Verbal cues for precautions/safety Gait Gait: Yes Gait Pattern: Impaired Gait Pattern: Decreased dorsiflexion - left;Poor foot clearance - left;Step-through pattern;Decreased step length - left Gait velocity: Increased since admission but continues to be slower than pre-morbid according to wife report Stairs / Additional Locomotion Stairs: Yes Stairs Assistance: 5: Supervision Stairs Assistance Details: Visual cues/gestures for precautions/safety Stair Management Technique: One rail Right Number of Stairs: 24 Wheelchair Mobility Wheelchair Mobility: No (Pt at functional ambulatory level)  Trunk/Postural Assessment  Cervical Assessment Cervical Assessment: Within Functional Limits Thoracic Assessment Thoracic Assessment: Within Functional Limits Lumbar Assessment Lumbar Assessment: Exceptions to [facility] (decreased lordotic curve) Postural Control Postural Control: Within Functional Limits Righting Reactions: delayed in standing, utilizes primarily stepping strategy to regain balance  Balance Standardized Balance Assessment Standardized Balance Assessment: Berg Balance Test;Timed Up and Go Test Berg Balance Test Sit to Stand: Able to stand without using hands and stabilize independently Standing Unsupported: Able to stand safely 2 minutes Sitting with Back Unsupported but Feet Supported on Floor or Stool: Able to sit safely and securely 2 minutes Stand to Sit: Sits safely with minimal use of hands Transfers: Able to transfer safely, minor use of hands Standing Unsupported with Eyes Closed: Able to stand 10 seconds safely Standing Ubsupported with Feet Together: Able to place feet together independently and stand for 1 minute with supervision From Standing, Reach Forward with Outstretched Arm: Can reach forward >12 cm safely (5") From Standing  Position, Pick up Object from Floor: Able to pick up shoe, needs supervision From Standing Position, Turn to Look Behind Over each Shoulder: Looks behind from both sides and weight shifts well Turn 360 Degrees: Able to turn 360 degrees safely in 4 seconds or less Standing Unsupported, Alternately Place Feet on Step/Stool: Able to stand independently and safely and complete 8 steps in 20 seconds Standing Unsupported, One Foot in Front: Able to plae foot ahead of the other independently and hold 30 seconds Standing on One Leg: Able to lift leg independently and hold equal to or more than 3 seconds Total Score: 50 Timed Up and Go Test TUG: Normal TUG Normal TUG (seconds): 13 Static Sitting Balance Static Sitting - Balance Support: Feet unsupported;No upper extremity supported Static Sitting - Level of Assistance: 7: Independent Dynamic Sitting Balance Dynamic Sitting - Balance Support: No upper extremity supported;During functional activity;Feet unsupported Dynamic Sitting - Level of Assistance:  5: Stand by assistance Static Standing Balance Static Standing - Balance Support: No upper extremity supported;During functional activity Static Standing - Level of Assistance: 5: Stand by assistance Dynamic Standing Balance Dynamic Standing - Balance Support: No upper extremity supported;During functional activity Dynamic Standing - Level of Assistance: 5: Stand by assistance Extremity Assessment  RUE Assessment RUE Assessment: Within Functional Limits (Minimal weakness, grossly 4/5) LUE Assessment LUE Assessment: Within Functional Limits (Minimal weakness, 4/5 grossly) RLE Assessment RLE Assessment: Within Functional Limits LLE Assessment LLE Assessment: Within Functional Limits LLE AROM (degrees) Overall AROM Left Lower Extremity: Within functional limits for tasks assessed LLE Strength LLE Overall Strength: Within Functional Limits for tasks assessed LLE Overall Strength Comments: Improved  strength demonstrated since admission, grossly [facility] Left Hip Flexion: 4/5 Left Hip ABduction: 4-/5 Left Knee Flexion: 4/5 Left Knee Extension: 4/5 Left Ankle Dorsiflexion: 3+/5 Left Ankle Plantar Flexion: 3+/5  See FIM for current functional status  Carlos Levering 06/05/2014, 6:29 PM

## 2014-06-05 NOTE — Progress Notes (Signed)
Occupational Therapy Session Note  Patient Details  Name: Geoffrey Neal MRN: [id number] Date of Birth: December 15, 1945  Today's Date: 06/05/2014 OT Individual Time: 0900-1000 OT Individual Time Calculation (min): 60 min    Short Term Goals: Week 2:  OT Short Term Goal 1 (Week 2): LTG=STG secondary to ELOS  Skilled Therapeutic Interventions/Progress Updates:    Pt engaged in BADL retraining including bathing at shower level, dressing with sit<>stand from chair, toilet transfers, toileting, and grooming while standing at sink.  Pt completed all tasks at supervision/mod I level.  Pt's wife and daughter present for continued family education.  Pt, wife, and daughter verbalized understanding of recommendation for 24 hour supervision.  Pt pleased with progress and ready for discharge later today.  Therapy Documentation Precautions:  Precautions Precautions: Fall Precaution Comments: Bifrontal craniectomy--NO SKULL FLAP Restrictions Weight Bearing Restrictions: No Pain: Pain Assessment Pain Assessment: No/denies pain Pain Score: 0-No pain  See FIM for current functional status  Therapy/Group: Individual Therapy  Leroy Libman 06/05/2014, 10:02 AM

## 2014-06-05 NOTE — Progress Notes (Signed)
Speech Language Pathology Session Note & Discharge Summary  Patient Details  Name: Geoffrey Neal MRN: [id number] Date of Birth: 03/11/1946  Today's Date: 06/05/2014 SLP Individual Time: 1000-1030 SLP Individual Time Calculation (min): 30 min   Skilled Therapeutic Interventions:  Skilled treatment session focused on cognitive goals and completion of family education with the patient's wife. SLP facilitated session by administering the [employer] in [place] with interpreter present. Patient scored a 19/30 with a score of 26 or above considered normal. Patient demonstrated deficits in working memory and attention throughout the evaluation. SLP also facilitated session by providing education to the patient's wife in regards to his current cognitive function and strategies to utilize to increase attention, working memory and Product/process development scientist. She verbalized understanding of all information.   Patient has met 3 of 4 long term goals.  Patient to discharge at overall Supervision;Min level.   Reasons goals not met: Patient continues to require Min A multimodal cues for selective attention    Clinical Impression/Discharge Summary: Patient has made functional gains and has met 3 of 4 LTG's this reporting period due to increased functional problem solving, working Marine scientist and awareness. Currently, patient requires supervision multimodal cues and extra time for functional problem solving with basic and familiar tasks, recall of daily, functional information and emergent awareness. Patient also requires Min A for selective attention in a mildly distracting environment. Patient/family education complete and patient will discharge home with 24 hour supervision from family. Patient would benefit from f/u SLP services to maximize his cognitive function and overall functional independence to reduce caregiver burden.   Care Partner:  Caregiver Able to Provide Assistance: Yes  Type of Caregiver Assistance:  Physical;Cognitive  Recommendation:  24 hour supervision/assistance;Home Health SLP  Rationale for SLP Follow Up: Maximize cognitive function and independence;Reduce caregiver burden   Equipment: N/A   Reasons for discharge: Treatment goals met;Discharged from hospital   Patient/Family Agrees with Progress Made and Goals Achieved: Yes   See FIM for current functional status  [name] 06/05/2014, 3:52 PM

## 2014-06-05 NOTE — Discharge Summary (Signed)
Discharge summary job [id number]

## 2014-06-06 NOTE — Discharge Summary (Signed)
Geoffrey Neal, Geoffrey Neal                 ACCOUNT NO.:  [id number]  MEDICAL RECORD NO.:  [id number]  LOCATION:  4W20C                        FACILITY:  [facility]  PHYSICIAN:  Meredith Staggers, M.D.DATE OF BIRTH:  04-10-1946  DATE OF ADMISSION:  05/25/2014 DATE OF DISCHARGE:  06/06/2014                              DISCHARGE SUMMARY   DISCHARGE DIAGNOSES: 1. Functional deficits secondary to bilateral frontoparietal     craniectomy for tumor excision of meningioma May 08, 2014, with     cranioplasty, May 22, 2014. 2. Subcutaneous heparin for DVT prophylaxis. 3. Dysphagia. 4. Seizure prophylaxis. 5. Hypothyroidism. 6. Urinary retention, improved. 7. Klebsiella urinary tract infection.  HISTORY OF PRESENT ILLNESS:  This is a 69 year old right-handed limited English-speaking male history of 2 months right frontal scalp mass, intermittent headaches, and blurred vision.  X-rays and imaging revealed aggressive extra-axial intracranial mass consistent with meningioma.  He was admitted May 08, 2014, underwent cerebral angiogram, embolization of the mass, followed by bilateral frontoparietal craniectomy, tumor excision per Dr. Christella Noa and maintained on Decadron protocol with Keppra for seizure prophylaxis.  The patient later underwent cranioplasty for skull defect post meningioma resection on May 22, 2014.  Subcutaneous heparin initiated for DVT prophylaxis on May 10, 2014.  He was on a dysphagia #2 thin liquid diet.  Physical and occupational therapy ongoing.  The patient was admitted for a comprehensive rehab program.  PAST MEDICAL HISTORY:  See discharge diagnoses.  SOCIAL HISTORY:  Lives with spouse.  FUNCTIONAL HISTORY PRIOR TO ADMISSION:  Independent.  FUNCTIONAL STATUS UPON ADMISSION TO REHAB SERVICES:  Moderate assist, +2 physical assist stand pivot transfers, as well as max assist squat pivot transfers, max assist to ambulate 4 feet with a rolling walker, min  to mod assist activities of daily living.  PHYSICAL EXAMINATION:  VITAL SIGNS:  Blood pressure 106/69, pulse 69, temperature 98, respirations 18. GENERAL:  This was an alert male, made good eye contact with examiner. His language was fair for general questioning.  He was able to provide his name and age, follow simple commands.  His cranioplasty site was clean and dry with staples intact. LUNGS:  Clear to auscultation. CARDIAC:  Regular rate and rhythm. ABDOMEN:  Soft, nontender.  Good bowel sounds.  REHABILITATION HOSPITAL COURSE:  The patient was admitted to Inpatient Rehab Services with therapies initiated on a 3-hour daily basis consisting of physical therapy, occupational therapy, speech therapy, and rehabilitation nursing.  The following issues were addressed during the patient's rehabilitation stay.  Pertaining to Mr. Vanatta craniectomy for tumor excision of meningioma on May 08, 2014 with cranioplasty on May 22, 2014, surgical site healing nicely, staples removed, he would follow up with Neurosurgery, Dr. Christella Noa. Subcutaneous heparin for DVT prophylaxis.  No bleeding episodes. Discontinued at the time of discharge.  He was using hydrocodone on limited basis for pain.  His diet had been advanced to regular, tolerating well.  He continued on Keppra for seizure prophylaxis.  No seizure activity noted.  Hormone supplement with Synthroid.  He had some initial urinary retention improved.  There was findings of a Klebsiella urinary tract infection and he was completing a course of Cipro.  The patient  received weekly collaborative interdisciplinary team conferences to discuss estimated length of stay, family teaching, and any barriers to discharge.  He was ambulating in a controlled environment 500 feet without assistive device overall supervision.  Tug was reassessed and Berg balance testing performed results noted of 50 for Berg and 13 second for Tug indicating continued  overall risk for falls and again discussed with family the need for supervision for safety and this was provided through a Optometrist.  He performed both car and floor transfers with supervision.  He was able to gather his belongings for activities of daily living in dressing and grooming.  Overall, the patient continued to do quite nicely.  All issues again were discussed through family with an interpreter.  He was discharged to home.  DISCHARGE MEDICATIONS: 1. Cipro 250 mg b.i.d. x3 more days and stop. 2. Hydrocodone 1 tablet every 4 hours as needed pain dispense of 90     tablets. 3. Keppra 500 mg p.o. b.i.d. 4. Synthroid 175 mcg p.o. daily. 5. Topamax 25 mg p.o. at bedtime.  DIET:  Regular.  FOLLOWUP:  The patient would follow up Dr. Alger Simons at the outpatient Rehab Service office on July 13, 2014; Dr. Ashok Pall 2 weeks call for appointment; Dr. York Ram medical management.  SPECIAL INSTRUCTIONS:  A 24-hour supervision for patient safety.     Lauraine Rinne, P.A.   ______________________________ Meredith Staggers, M.D.    DA/MEDQ  D:  06/05/2014  T:  06/06/2014  Job:  [id number]  cc:   York Ram, MD Ashok Pall, M.D.

## 2014-06-07 ENCOUNTER — Emergency Department (HOSPITAL_COMMUNITY): Payer: Medicare Other

## 2014-06-07 ENCOUNTER — Emergency Department (HOSPITAL_COMMUNITY)
Admission: EM | Admit: 2014-06-07 | Discharge: 2014-06-07 | Disposition: A | Discharge status: Home / Self Care | Payer: Medicare Other | Attending: Emergency Medicine | Admitting: Emergency Medicine

## 2014-06-07 ENCOUNTER — Encounter (HOSPITAL_COMMUNITY): Payer: Self-pay | Admitting: Emergency Medicine

## 2014-06-07 DIAGNOSIS — Z792 Long term (current) use of antibiotics: Secondary | ICD-10-CM | POA: Insufficient documentation

## 2014-06-07 DIAGNOSIS — T797XXA Traumatic subcutaneous emphysema, initial encounter: Secondary | ICD-10-CM

## 2014-06-07 DIAGNOSIS — Z87891 Personal history of nicotine dependence: Secondary | ICD-10-CM | POA: Insufficient documentation

## 2014-06-07 DIAGNOSIS — Z8719 Personal history of other diseases of the digestive system: Secondary | ICD-10-CM | POA: Insufficient documentation

## 2014-06-07 DIAGNOSIS — Z79899 Other long term (current) drug therapy: Secondary | ICD-10-CM | POA: Insufficient documentation

## 2014-06-07 DIAGNOSIS — Z8739 Personal history of other diseases of the musculoskeletal system and connective tissue: Secondary | ICD-10-CM | POA: Diagnosis not present

## 2014-06-07 DIAGNOSIS — T8182XA Emphysema (subcutaneous) resulting from a procedure, initial encounter: Secondary | ICD-10-CM | POA: Diagnosis not present

## 2014-06-07 DIAGNOSIS — Z87442 Personal history of urinary calculi: Secondary | ICD-10-CM | POA: Diagnosis not present

## 2014-06-07 DIAGNOSIS — E039 Hypothyroidism, unspecified: Secondary | ICD-10-CM | POA: Insufficient documentation

## 2014-06-07 DIAGNOSIS — E119 Type 2 diabetes mellitus without complications: Secondary | ICD-10-CM | POA: Insufficient documentation

## 2014-06-07 DIAGNOSIS — Y836 Removal of other organ (partial) (total) as the cause of abnormal reaction of the patient, or of later complication, without mention of misadventure at the time of the procedure: Secondary | ICD-10-CM | POA: Diagnosis not present

## 2014-06-07 DIAGNOSIS — R51 Headache: Secondary | ICD-10-CM | POA: Diagnosis present

## 2014-06-07 LAB — CBC WITH DIFFERENTIAL/PLATELET
BASOS ABS: 0 10*3/uL (ref 0.0–0.1)
Basophils Relative: 0 % (ref 0–1)
EOS ABS: 0.1 10*3/uL (ref 0.0–0.7)
EOS PCT: 1 % (ref 0–5)
HEMATOCRIT: 32.2 % — AB (ref 39.0–52.0)
Hemoglobin: 10.4 g/dL — ABNORMAL LOW (ref 13.0–17.0)
LYMPHS PCT: 20 % (ref 12–46)
Lymphs Abs: 1.3 10*3/uL (ref 0.7–4.0)
MCH: 29.1 pg (ref 26.0–34.0)
MCHC: 32.3 g/dL (ref 30.0–36.0)
MCV: 89.9 fL (ref 78.0–100.0)
MONO ABS: 0.4 10*3/uL (ref 0.1–1.0)
Monocytes Relative: 7 % (ref 3–12)
NEUTROS ABS: 4.7 10*3/uL (ref 1.7–7.7)
NEUTROS PCT: 72 % (ref 43–77)
Platelets: 311 10*3/uL (ref 150–400)
RBC: 3.58 MIL/uL — ABNORMAL LOW (ref 4.22–5.81)
RDW: 13.2 % (ref 11.5–15.5)
WBC: 6.5 10*3/uL (ref 4.0–10.5)

## 2014-06-07 LAB — COMPREHENSIVE METABOLIC PANEL
ALK PHOS: 95 U/L (ref 39–117)
ALT: 38 U/L (ref 0–53)
AST: 26 U/L (ref 0–37)
Albumin: 3.1 g/dL — ABNORMAL LOW (ref 3.5–5.2)
Anion gap: 9 (ref 5–15)
BUN: 14 mg/dL (ref 6–23)
CO2: 24 mmol/L (ref 19–32)
Calcium: 8.9 mg/dL (ref 8.4–10.5)
Chloride: 108 mmol/L (ref 96–112)
Creatinine, Ser: 0.85 mg/dL (ref 0.50–1.35)
GFR calc Af Amer: >90 mL/min (ref >90)
GFR calc non Af Amer: 86 mL/min — ABNORMAL LOW (ref >90)
GLUCOSE: 156 mg/dL — AB (ref 70–99)
POTASSIUM: 4.4 mmol/L (ref 3.5–5.1)
SODIUM: 141 mmol/L (ref 135–145)
Total Bilirubin: 0.3 mg/dL (ref 0.3–1.2)
Total Protein: 6.3 g/dL (ref 6.0–8.3)

## 2014-06-07 LAB — CBG MONITORING, ED: GLUCOSE-CAPILLARY: 149 mg/dL — AB (ref 70–99)

## 2014-06-07 NOTE — ED Notes (Signed)
CBG 149. 

## 2014-06-07 NOTE — ED Notes (Signed)
Pt had brain surgery January 15th. Family reports that pt is c/o pain and swelling to head. When family palpates pt head they feel "water" there. Pt is AAOx4 per norm.

## 2014-06-07 NOTE — ED Notes (Signed)
MD at bedside. 

## 2014-06-07 NOTE — ED Notes (Signed)
Assisted patient to rest room, collected urine sample. Sample at bed side.

## 2014-06-07 NOTE — ED Provider Notes (Signed)
CSN: [id number]     Arrival date & time 06/07/14  1116 History   First MD Initiated Contact with Patient 06/07/14 1150     Chief Complaint  Patient presents with  . Post-op Problem  . Headache     HPI The patient was just released from the hospital on February 13 after being in rehabilitation  For Functional deficits secondary to bilateral frontoparietal craniectomy for tumor excision of meningioma May 08, 2014, with  cranioplasty, May 22, 2014.  The patient has been feeling well. He has been able to get up and walk around and fixes breakfast. Morning however he was feeling along his surgical site on the right side of his skull and he felt that there was fluid that he was able to move beneath the skin. The patient was concerned about this. They decided to come to the emergency room to be evaluated. He has not had any other symptoms. He has not noticed any drainage. He denies any fevers.  Past Medical History  Diagnosis Date  . Elevated PSA   . Frequency of urination   . Mixed hyperlipidemia     MILD PER PT  . Nocturia   . DJD (degenerative joint disease)   . Hypothyroidism   . Diabetes mellitus without complication     ?  Marland Kitchen History of kidney stones     yrs ago  . GERD (gastroesophageal reflux disease)     occ   Past Surgical History  Procedure Laterality Date  . Prostate biopsy N/A 07/19/2012    Procedure: EUA, PROSTATE BIOPSY TRANSRECTAL ULTRASONIC PROSTATE (TUBP);  Surgeon: Fredricka Bonine, MD;  Location: [facility];  Service: Urology;  Laterality: N/A;  . Tonsillectomy    . Radiology with anesthesia N/A 05/07/2014    Procedure: RADIOLOGY WITH ANESTHESIA/EMBOLIZATION;  Surgeon: Rob Hickman, MD;  Location: [facility];  Service: Radiology;  Laterality: N/A;  . Craniotomy Bilateral 05/08/2014    Procedure: BILATERAL FRONTAL PARIETAL CRANIOTOMY TUMOR EXCISION;  Surgeon: Ashok Pall, MD;  Location: [facility];  Service: Neurosurgery;  Laterality:  Bilateral;  . Cranioplasty N/A 05/22/2014    Procedure: CRANIOPLASTY;  Surgeon: Ashok Pall, MD;  Location: [facility];  Service: Neurosurgery;  Laterality: N/A;  Cranioplasty   No family history on file. History  Substance Use Topics  . Smoking status: Former Smoker -- 15 years    Types: Cigarettes    Quit date: 05/05/1981  . Smokeless tobacco: Never Used     Comment: QUIT SMOKING 34 YRS AGO--  APPROX. 1980'S  . Alcohol Use: No     Comment: quit 34 yrs ago    Review of Systems  All other systems reviewed and are negative.     Allergies  Review of patient's allergies indicates no known allergies.  Home Medications   Prior to Admission medications   Medication Sig Start Date End Date Taking? Authorizing Provider  acetaminophen (TYLENOL) 325 MG tablet Take 1-2 tablets (325-650 mg total) by mouth every 4 (four) hours as needed for mild pain. 06/05/14  Yes Daniel J Angiulli, PA-C  ciprofloxacin (CIPRO) 250 MG tablet Take 1 tablet (250 mg total) by mouth 2 (two) times daily. 06/05/14  Yes Daniel J Angiulli, PA-C  HYDROcodone-acetaminophen (NORCO/VICODIN) 5-325 MG per tablet Take 1 tablet by mouth every 4 (four) hours as needed for moderate pain. 06/05/14  Yes Daniel J Angiulli, PA-C  levETIRAcetam (KEPPRA) 500 MG tablet Take 1 tablet (500 mg total) by mouth 2 (two) times daily.  06/05/14  Yes Daniel J Angiulli, PA-C  levothyroxine (SYNTHROID, LEVOTHROID) 175 MCG tablet Take 1 tablet (175 mcg total) by mouth daily before breakfast. 06/05/14  Yes Daniel J Angiulli, PA-C  topiramate (TOPAMAX) 25 MG tablet Take 1 tablet (25 mg total) by mouth at bedtime. 06/05/14  Yes Daniel J Angiulli, PA-C   BP 109/67 mmHg  Pulse 69  Temp(Src) 98.2 F (36.8 C) (Oral)  Resp 19  Ht 5\' 7"  (1.702 m)  Wt 210 lb (95.255 kg)  BMI 32.88 kg/m2  SpO2 98% Physical Exam  Constitutional: He appears well-developed and well-nourished. No distress.  HENT:  Head: Normocephalic and atraumatic.  Right Ear: External ear  normal.  Left Ear: External ear normal.  Status post craniotomy on the right side of the skull,  small amount of edema or air subcutaneously that I am able to palpate, the fluid is easily moved beneath the skin, no erythema, skin is intact  Eyes: Conjunctivae are normal. Right eye exhibits no discharge. Left eye exhibits no discharge. No scleral icterus.  Neck: Neck supple. No tracheal deviation present.  Cardiovascular: Normal rate, regular rhythm and intact distal pulses.   Pulmonary/Chest: Effort normal and breath sounds normal. No stridor. No respiratory distress. He has no wheezes. He has no rales.  Abdominal: Soft. Bowel sounds are normal. He exhibits no distension. There is no tenderness. There is no rebound and no guarding.  Musculoskeletal: He exhibits no edema or tenderness.  Neurological: He is alert. He has normal strength. No cranial nerve deficit (no facial droop, extraocular movements intact, no slurred speech) or sensory deficit. He exhibits normal muscle tone. He displays no seizure activity. Coordination normal.  Skin: Skin is warm and dry. No rash noted.  Psychiatric: He has a normal mood and affect.  Nursing note and vitals reviewed.   ED Course  Procedures (including critical care time) Labs Review Labs Reviewed  CBC WITH DIFFERENTIAL/PLATELET - Abnormal; Notable for the following:    RBC 3.58 (*)    Hemoglobin 10.4 (*)    HCT 32.2 (*)    All other components within normal limits  COMPREHENSIVE METABOLIC PANEL - Abnormal; Notable for the following:    Glucose, Bld 156 (*)    Albumin 3.1 (*)    GFR calc non Af Amer 86 (*)    All other components within normal limits  CBG MONITORING, ED - Abnormal; Notable for the following:    Glucose-Capillary 149 (*)    All other components within normal limits    Imaging Review Ct Head Wo Contrast  06/07/2014   CLINICAL DATA:  The patient reports scalp fluid on the RIGHT which has developed following discharge from the  hospital 2 days ago. Previous meningioma removal followed by cranioplasty.  EXAM: CT HEAD WITHOUT CONTRAST  TECHNIQUE: Contiguous axial images were obtained from the base of the skull through the vertex without intravenous contrast.  COMPARISON:  Most recent exam 05/09/2014.  FINDINGS: Since the previous CT scan, the patient has undergone cranioplasty 05/22/2014. There is intracranial air directly under the cranioplasty in the bifrontal region. This is an expected postoperative finding.  There is an additional collection of air over the RIGHT temporalis muscle, external to the cranioplasty flap. Its significance is unknown. There is a small amount of subgaleal fluid posterior to the air, less than 1 cm thick, which appears non worrisome (see for instance image 20 series 2).  The PEEK cranioplasty is well-seated, with no evidence for malposition. No lucency is seen in  the native calvarium to suggest infection. No obvious connection of the flap with the frontal sinuses or mastoids.  No significant intracranial extra-axial fluid collections. Moderate cerebral and cerebellar atrophy is stable from priors. No midline shift, hydrocephalus, or significant intra-axial abnormalities.  IMPRESSION: The reported scalp fluid on the RIGHT appears to represent subcutaneous air. This is associated with a mild amount of intracranial pneumocephalus. Its significance is uncertain.   Electronically Signed   By: Rolla Flatten M.D.   On: 06/07/2014 14:16      MDM   Final diagnoses:  Subcutaneous air, initial encounter   Discussed findings with Dr Ronnald Ramp.  Pt did have air on previous CT scans.  No concerning findings on CT scan.  Stable for dicharge  Discussed findings with family.  Reassured them.  Dorie Rank, MD 06/07/14 [phone]

## 2014-06-08 NOTE — Progress Notes (Signed)
Social Work Discharge Note  The overall goal for the admission was met for:   Discharge location: Yes - home  Length of Stay: Yes - 11 days  Discharge activity level: Yes - supervision  Home/community participation: Yes  Services provided included: MD, RD, PT, OT, SLP, RN, Pharmacy and [place]: Medicare and Medicaid  Follow-up services arranged: Home Health: PT/OT/ST with [facility] and DME: tub bench from [facility] (or additional information):  Pt decided to d/c after therapies on 06-05-14 unstead of 06-06-14.  He will have f/u therapies from [place].  Pt's dtr to take pt to drop in hours at pt's primary care physician for post hospital f/u visit.  Patient/Family verbalized understanding of follow-up arrangements: Yes  Individual responsible for coordination of the follow-up plan: pt and dtr  Confirmed correct DME delivered: Trey Sailors 06/08/2014    Carine Nordgren, Silvestre Mesi

## 2014-07-13 ENCOUNTER — Encounter: Payer: Medicare Other | Admitting: Physical Medicine & Rehabilitation

## 2014-08-10 ENCOUNTER — Other Ambulatory Visit: Payer: Self-pay | Admitting: Neurosurgery

## 2014-08-10 DIAGNOSIS — D329 Benign neoplasm of meninges, unspecified: Secondary | ICD-10-CM

## 2014-08-12 ENCOUNTER — Ambulatory Visit
Admission: RE | Admit: 2014-08-12 | Discharge: 2014-08-12 | Disposition: A | Discharge status: Home / Self Care | Payer: Medicare Other | Source: Ambulatory Visit | Attending: Neurosurgery | Admitting: Neurosurgery

## 2014-08-12 DIAGNOSIS — D329 Benign neoplasm of meninges, unspecified: Secondary | ICD-10-CM

## 2014-10-19 ENCOUNTER — Other Ambulatory Visit: Payer: Self-pay

## 2015-06-22 ENCOUNTER — Other Ambulatory Visit: Payer: Self-pay | Admitting: Neurosurgery

## 2015-06-22 DIAGNOSIS — D329 Benign neoplasm of meninges, unspecified: Secondary | ICD-10-CM

## 2015-07-02 ENCOUNTER — Ambulatory Visit
Admission: RE | Admit: 2015-07-02 | Discharge: 2015-07-02 | Disposition: A | Discharge status: Home / Self Care | Payer: Medicare Other | Source: Ambulatory Visit | Attending: Neurosurgery | Admitting: Neurosurgery

## 2015-07-02 DIAGNOSIS — D329 Benign neoplasm of meninges, unspecified: Secondary | ICD-10-CM

## 2015-07-02 MED ORDER — GADOBENATE DIMEGLUMINE 529 MG/ML IV SOLN
20.0000 mL | Freq: Once | INTRAVENOUS | Status: AC | PRN
  Administered 2015-07-02: 20 mL via INTRAVENOUS

## 2015-11-22 ENCOUNTER — Other Ambulatory Visit: Payer: Self-pay | Admitting: Neurosurgery

## 2015-11-22 DIAGNOSIS — D329 Benign neoplasm of meninges, unspecified: Secondary | ICD-10-CM

## 2015-11-26 ENCOUNTER — Ambulatory Visit
Admission: RE | Admit: 2015-11-26 | Discharge: 2015-11-26 | Disposition: A | Discharge status: Home / Self Care | Payer: Medicare Other | Source: Ambulatory Visit | Attending: Neurosurgery | Admitting: Neurosurgery

## 2015-11-26 ENCOUNTER — Other Ambulatory Visit: Payer: Self-pay | Admitting: Neurosurgery

## 2015-11-26 DIAGNOSIS — D329 Benign neoplasm of meninges, unspecified: Secondary | ICD-10-CM

## 2015-11-29 ENCOUNTER — Ambulatory Visit
Admission: RE | Admit: 2015-11-29 | Discharge: 2015-11-29 | Disposition: A | Discharge status: Home / Self Care | Payer: Medicare Other | Source: Ambulatory Visit | Attending: Neurosurgery | Admitting: Neurosurgery

## 2015-11-29 DIAGNOSIS — D329 Benign neoplasm of meninges, unspecified: Secondary | ICD-10-CM

## 2015-11-29 MED ORDER — GADOBENATE DIMEGLUMINE 529 MG/ML IV SOLN
20.0000 mL | Freq: Once | INTRAVENOUS | Status: AC | PRN
  Administered 2015-11-29: 20 mL via INTRAVENOUS

## 2015-12-07 ENCOUNTER — Other Ambulatory Visit (HOSPITAL_COMMUNITY): Payer: Self-pay | Admitting: Neurosurgery

## 2015-12-07 DIAGNOSIS — D329 Benign neoplasm of meninges, unspecified: Secondary | ICD-10-CM

## 2015-12-23 ENCOUNTER — Other Ambulatory Visit (HOSPITAL_COMMUNITY): Payer: Self-pay | Admitting: Neurosurgery

## 2015-12-23 DIAGNOSIS — T888XXD Other specified complications of surgical and medical care, not elsewhere classified, subsequent encounter: Secondary | ICD-10-CM

## 2015-12-30 ENCOUNTER — Encounter (HOSPITAL_COMMUNITY): Payer: Self-pay | Admitting: *Deleted

## 2015-12-30 ENCOUNTER — Other Ambulatory Visit: Payer: Self-pay | Admitting: Neurosurgery

## 2015-12-30 NOTE — Progress Notes (Signed)
I called patient using [employer], [place] # [id number].  I instructed patient to check CBG to check CBG and if it is less than 70 to treat it with Glucose Gel, Glucose tablets or 1/2 cup of clear juice like apple juice or cranberry juice, or 1/2 cup of regular soda. (not cream soda). I instructed patient to recheck CBG in 15 minutes and if CBG is not greater than 70, to  Call [phone] (pre- op). If it is before pre-op opens to retreat as before and recheck CBG in 15 minutes. I told patient to make note of time that liquid is taken and amount, that surgical time may have to be adjusted.

## 2015-12-31 ENCOUNTER — Encounter (HOSPITAL_COMMUNITY)
Admission: RE | Disposition: A | Discharge status: Home / Self Care | Payer: Self-pay | Source: Ambulatory Visit | Attending: Neurosurgery

## 2015-12-31 ENCOUNTER — Ambulatory Visit (HOSPITAL_COMMUNITY): Payer: Medicare Other | Admitting: Anesthesiology

## 2015-12-31 ENCOUNTER — Ambulatory Visit (HOSPITAL_COMMUNITY): Admission: RE | Admit: 2015-12-31 | Payer: Medicare Other | Source: Ambulatory Visit

## 2015-12-31 ENCOUNTER — Ambulatory Visit (HOSPITAL_COMMUNITY)
Admission: RE | Admit: 2015-12-31 | Discharge: 2015-12-31 | Disposition: A | Discharge status: Home / Self Care | Payer: Medicare Other | Source: Ambulatory Visit | Attending: Neurosurgery | Admitting: Neurosurgery

## 2015-12-31 ENCOUNTER — Encounter (HOSPITAL_COMMUNITY): Payer: Self-pay | Admitting: *Deleted

## 2015-12-31 DIAGNOSIS — Z7984 Long term (current) use of oral hypoglycemic drugs: Secondary | ICD-10-CM | POA: Insufficient documentation

## 2015-12-31 DIAGNOSIS — G960 Cerebrospinal fluid leak: Secondary | ICD-10-CM | POA: Diagnosis present

## 2015-12-31 DIAGNOSIS — E119 Type 2 diabetes mellitus without complications: Secondary | ICD-10-CM | POA: Insufficient documentation

## 2015-12-31 DIAGNOSIS — Z87891 Personal history of nicotine dependence: Secondary | ICD-10-CM | POA: Insufficient documentation

## 2015-12-31 HISTORY — PX: HEMATOMA EVACUATION: SHX5118

## 2015-12-31 HISTORY — DX: Other visual disturbances: H53.8

## 2015-12-31 HISTORY — DX: Adverse effect of unspecified anesthetic, initial encounter: T41.45XA

## 2015-12-31 HISTORY — DX: Other complications of anesthesia, initial encounter: T88.59XA

## 2015-12-31 HISTORY — DX: Unspecified convulsions: R56.9

## 2015-12-31 LAB — CBC
HCT: 40.2 % (ref 39.0–52.0)
Hemoglobin: 12.6 g/dL — ABNORMAL LOW (ref 13.0–17.0)
MCH: 24.1 pg — AB (ref 26.0–34.0)
MCHC: 31.3 g/dL (ref 30.0–36.0)
MCV: 76.9 fL — AB (ref 78.0–100.0)
Platelets: 246 10*3/uL (ref 150–400)
RBC: 5.23 MIL/uL (ref 4.22–5.81)
RDW: 16.5 % — AB (ref 11.5–15.5)
WBC: 8.6 10*3/uL (ref 4.0–10.5)

## 2015-12-31 LAB — BASIC METABOLIC PANEL
Anion gap: 10 (ref 5–15)
BUN: 15 mg/dL (ref 6–20)
CHLORIDE: 106 mmol/L (ref 101–111)
CO2: 25 mmol/L (ref 22–32)
CREATININE: 0.9 mg/dL (ref 0.61–1.24)
Calcium: 9.1 mg/dL (ref 8.9–10.3)
GFR calc Af Amer: >60 mL/min (ref >60)
GFR calc non Af Amer: >60 mL/min (ref >60)
GLUCOSE: 108 mg/dL — AB (ref 65–99)
Potassium: 3.7 mmol/L (ref 3.5–5.1)
SODIUM: 141 mmol/L (ref 135–145)

## 2015-12-31 LAB — SURGICAL PCR SCREEN
MRSA, PCR: NEGATIVE
STAPHYLOCOCCUS AUREUS: NEGATIVE

## 2015-12-31 LAB — GLUCOSE, CAPILLARY: Glucose-Capillary: 107 mg/dL — ABNORMAL HIGH (ref 65–99)

## 2015-12-31 SURGERY — EVACUATION HEMATOMA
Anesthesia: Monitor Anesthesia Care

## 2015-12-31 MED ORDER — MUPIROCIN 2 % EX OINT
1.0000 "application " | TOPICAL_OINTMENT | Freq: Once | CUTANEOUS | Status: AC
  Administered 2015-12-31: 1 via TOPICAL
  Filled 2015-12-31: qty 22

## 2015-12-31 MED ORDER — FENTANYL CITRATE (PF) 100 MCG/2ML IJ SOLN: 25.0000 ug | INTRAMUSCULAR | Status: DC | PRN

## 2015-12-31 MED ORDER — LACTATED RINGERS IV SOLN
INTRAVENOUS | Status: DC
  Administered 2015-12-31 (×2): via INTRAVENOUS

## 2015-12-31 MED ORDER — ONDANSETRON HCL 4 MG/2ML IJ SOLN: 4.0000 mg | Freq: Once | INTRAMUSCULAR | Status: DC | PRN

## 2015-12-31 MED ORDER — PROPOFOL 10 MG/ML IV BOLUS
INTRAVENOUS | Status: DC | PRN
  Administered 2015-12-31 (×5): 10 mg via INTRAVENOUS

## 2015-12-31 MED ORDER — OXYCODONE HCL 5 MG/5ML PO SOLN
5.0000 mg | Freq: Once | ORAL | Status: DC | PRN
Start: 2015-12-31 — End: 2015-12-31

## 2015-12-31 MED ORDER — OXYCODONE HCL 5 MG PO TABS: 5.0000 mg | ORAL_TABLET | Freq: Once | ORAL | Status: DC | PRN

## 2015-12-31 MED ORDER — BUPIVACAINE-EPINEPHRINE 0.5% -1:200000 IJ SOLN
INTRAMUSCULAR | Status: DC | PRN
  Administered 2015-12-31: 5 mL

## 2015-12-31 SURGICAL SUPPLY — 69 items
BANDAGE ACE 4X5 VEL STRL LF (GAUZE/BANDAGES/DRESSINGS) ×2 IMPLANT
BANDAGE ADH SHEER 1  50/CT (GAUZE/BANDAGES/DRESSINGS) ×4 IMPLANT
BLADE SURG 15 STRL LF DISP TIS (BLADE) ×1 IMPLANT
BLADE SURG 15 STRL SS (BLADE)
CANISTER SUCT 3000ML PPV (MISCELLANEOUS) ×1 IMPLANT
CATH VENTRIC 35X38 W/TROCAR LG (CATHETERS) IMPLANT
CORDS BIPOLAR (ELECTRODE) ×3 IMPLANT
DECANTER SPIKE VIAL GLASS SM (MISCELLANEOUS) ×3 IMPLANT
DRAPE HALF SHEET 40X57 (DRAPES) ×3 IMPLANT
DRAPE POUCH INSTRU U-SHP 10X18 (DRAPES) IMPLANT
DURAPREP 6ML APPLICATOR 50/CS (WOUND CARE) ×5 IMPLANT
ELECT CAUTERY BLADE 6.4 (BLADE) IMPLANT
ELECT REM PT RETURN 9FT ADLT (ELECTROSURGICAL)
ELECTRODE REM PT RTRN 9FT ADLT (ELECTROSURGICAL) ×1 IMPLANT
GAUZE SPONGE 4X4 12PLY STRL (GAUZE/BANDAGES/DRESSINGS) IMPLANT
GAUZE SPONGE 4X4 16PLY XRAY LF (GAUZE/BANDAGES/DRESSINGS) ×3 IMPLANT
GLOVE BIO SURGEON STRL SZ 6.5 (GLOVE) IMPLANT
GLOVE BIO SURGEON STRL SZ7 (GLOVE) ×4 IMPLANT
GLOVE BIO SURGEON STRL SZ7.5 (GLOVE) IMPLANT
GLOVE BIO SURGEON STRL SZ8 (GLOVE) IMPLANT
GLOVE BIO SURGEON STRL SZ8.5 (GLOVE) IMPLANT
GLOVE BIO SURGEONS STRL SZ 6.5 (GLOVE)
GLOVE BIOGEL M 8.0 STRL (GLOVE) IMPLANT
GLOVE ECLIPSE 6.5 STRL STRAW (GLOVE) ×5 IMPLANT
GLOVE ECLIPSE 7.0 STRL STRAW (GLOVE) IMPLANT
GLOVE ECLIPSE 7.5 STRL STRAW (GLOVE) ×2 IMPLANT
GLOVE ECLIPSE 8.0 STRL XLNG CF (GLOVE) IMPLANT
GLOVE EXAM NITRILE LRG STRL (GLOVE) IMPLANT
GLOVE EXAM NITRILE XL STR (GLOVE) IMPLANT
GLOVE EXAM NITRILE XS STR PU (GLOVE) IMPLANT
GLOVE INDICATOR 6.5 STRL GRN (GLOVE) IMPLANT
GLOVE INDICATOR 7.0 STRL GRN (GLOVE) IMPLANT
GLOVE INDICATOR 7.5 STRL GRN (GLOVE) IMPLANT
GLOVE INDICATOR 8.0 STRL GRN (GLOVE) IMPLANT
GLOVE INDICATOR 8.5 STRL (GLOVE) IMPLANT
GLOVE OPTIFIT SS 8.0 STRL (GLOVE) IMPLANT
GLOVE SURG SS PI 6.5 STRL IVOR (GLOVE) IMPLANT
GOWN STRL REUS W/ TWL LRG LVL3 (GOWN DISPOSABLE) ×1 IMPLANT
GOWN STRL REUS W/ TWL XL LVL3 (GOWN DISPOSABLE) IMPLANT
GOWN STRL REUS W/TWL 2XL LVL3 (GOWN DISPOSABLE) IMPLANT
GOWN STRL REUS W/TWL LRG LVL3 (GOWN DISPOSABLE) ×3
GOWN STRL REUS W/TWL XL LVL3 (GOWN DISPOSABLE)
KIT BASIN OR (CUSTOM PROCEDURE TRAY) ×1 IMPLANT
KIT DRAIN CSF ACCUDRAIN (MISCELLANEOUS) IMPLANT
KIT ROOM TURNOVER OR (KITS) ×3 IMPLANT
NDL HYPO 18GX1.5 BLUNT FILL (NEEDLE) IMPLANT
NDL HYPO 25X1 1.5 SAFETY (NEEDLE) ×1 IMPLANT
NEEDLE HYPO 18GX1.5 BLUNT FILL (NEEDLE) ×3 IMPLANT
NEEDLE HYPO 25X1 1.5 SAFETY (NEEDLE) ×3 IMPLANT
NS IRRIG 1000ML POUR BTL (IV SOLUTION) ×1 IMPLANT
PACK EENT II TURBAN DRAPE (CUSTOM PROCEDURE TRAY) ×1 IMPLANT
PAD ARMBOARD 7.5X6 YLW CONV (MISCELLANEOUS) ×3 IMPLANT
PENCIL BUTTON HOLSTER BLD 10FT (ELECTRODE) IMPLANT
SET POST CRANIOTOMY SUBDURAL (MISCELLANEOUS) IMPLANT
SUT ETHILON 3 0 PS 1 (SUTURE) ×1 IMPLANT
SUT SILK 2 0 FS (SUTURE) ×1 IMPLANT
SUT VIC AB 2-0 CT2 18 VCP726D (SUTURE) IMPLANT
SUT VIC AB 3-0 SH 8-18 (SUTURE) IMPLANT
SYR 30ML LL (SYRINGE) ×2 IMPLANT
SYR 50ML SLIP (SYRINGE) ×2 IMPLANT
SYR BULB 3OZ (MISCELLANEOUS) ×1 IMPLANT
SYR CONTROL 10ML LL (SYRINGE) ×3 IMPLANT
TOWEL OR 17X24 6PK STRL BLUE (TOWEL DISPOSABLE) ×3 IMPLANT
TOWEL OR 17X26 10 PK STRL BLUE (TOWEL DISPOSABLE) ×1 IMPLANT
TRAY FOLEY W/METER SILVER 16FR (SET/KITS/TRAYS/PACK) IMPLANT
TUBE CONNECTING 12'X1/4 (SUCTIONS)
TUBE CONNECTING 12X1/4 (SUCTIONS) IMPLANT
UNDERPAD 30X30 (UNDERPADS AND DIAPERS) IMPLANT
WATER STERILE IRR 1000ML POUR (IV SOLUTION) ×1 IMPLANT

## 2015-12-31 NOTE — Anesthesia Postprocedure Evaluation (Signed)
Anesthesia Post Note  Patient: Geoffrey Neal  Procedure(s) Performed: Procedure(s) (LRB): ASPIRATION OF SUBCUTANEOUS FLUID COLLECTION of Cranial Defect (N/A)  Patient location during evaluation: PACU Anesthesia Type: MAC Level of consciousness: awake, awake and alert and oriented Pain management: pain level controlled Vital Signs Assessment: post-procedure vital signs reviewed and stable Respiratory status: spontaneous breathing, nonlabored ventilation and respiratory function stable Cardiovascular status: blood pressure returned to baseline Anesthetic complications: no    Last Vitals:  Vitals:   12/31/15 1515 12/31/15 1538  BP: 130/82 (!) 163/90  Pulse: 68 70  Resp: (!) 31 20  Temp:      Last Pain:  Vitals:   12/31/15 1538  TempSrc:   PainSc: 0-No pain                 Jisell Majer COKER

## 2015-12-31 NOTE — Discharge Instructions (Signed)
Try to leave ace wrap in place for two days. If the wrap comes off please put it back on during the first 48 hours.

## 2015-12-31 NOTE — H&P (Signed)
BP (!) 179/97   Pulse 74   Temp 99 F (37.2 C) (Oral)   Resp 18   SpO2 96%   [name] had developed a spinal fluid leak, manifesting itself as a fluid collection under his scalp. There is no pain, or discomfort. He has a normal neurologic exam.  Allergies  Allergen Reactions  . No Known Allergies   . Lemon Flavor Rash  . Orange Fruit [Citrus] Rash   Prior to Admission medications   Medication Sig Start Date End Date Taking? Authorizing Provider  gabapentin (NEURONTIN) 300 MG capsule Take 300 mg by mouth 2 (two) times daily.   Yes Historical Provider, MD  levothyroxine (SYNTHROID, LEVOTHROID) 175 MCG tablet Take 1 tablet (175 mcg total) by mouth daily before breakfast. 06/05/14  Yes Lavon Paganini Angiulli, PA-C  metFORMIN (GLUCOPHAGE) 500 MG tablet Take 500 mg by mouth 2 (two) times daily with a meal.   Yes Historical Provider, MD  Multiple Vitamins-Minerals (CENTRUM SILVER PO) Take 1 tablet by mouth daily.   Yes Historical Provider, MD  acetaminophen (TYLENOL) 325 MG tablet Take 1-2 tablets (325-650 mg total) by mouth every 4 (four) hours as needed for mild pain. Patient not taking: Reported on 12/24/2015 06/05/14   Lavon Paganini Angiulli, PA-C  levETIRAcetam (KEPPRA) 500 MG tablet Take 1 tablet (500 mg total) by mouth 2 (two) times daily. Patient not taking: Reported on 12/24/2015 06/05/14   Lavon Paganini Angiulli, PA-C  topiramate (TOPAMAX) 25 MG tablet Take 1 tablet (25 mg total) by mouth at bedtime. Patient not taking: Reported on 12/24/2015 06/05/14   Cathlyn Parsons, PA-C   Past Surgical History:  Procedure Laterality Date  . COLONOSCOPY W/ POLYPECTOMY     x 2  . CRANIOPLASTY N/A 05/22/2014   Procedure: CRANIOPLASTY;  Surgeon: Ashok Pall, MD;  Location: [facility];  Service: Neurosurgery;  Laterality: N/A;  Cranioplasty  . CRANIOTOMY Bilateral 05/08/2014   Procedure: BILATERAL FRONTAL PARIETAL CRANIOTOMY TUMOR EXCISION;  Surgeon: Ashok Pall, MD;  Location: [facility];  Service: Neurosurgery;   Laterality: Bilateral;  . PROSTATE BIOPSY N/A 07/19/2012   Procedure: EUA, PROSTATE BIOPSY TRANSRECTAL ULTRASONIC PROSTATE (TUBP);  Surgeon: Fredricka Bonine, MD;  [facility];  Service: Urology;  Laterality: N/A;  . RADIOLOGY WITH ANESTHESIA N/A 05/07/2014   Procedure: RADIOLOGY WITH ANESTHESIA/EMBOLIZATION;  Surgeon: Rob Hickman, MD;  Location: [facility];  Service: Radiology;  Laterality: N/A;  . TONSILLECTOMY     History reviewed. No pertinent family history. Social History   Social History  . Marital status: Married    Spouse name: N/A  . Number of children: N/A  . Years of education: N/A   Occupational History  . Not on file.   Social History Main Topics  . Smoking status: Former Smoker    Years: 15.00    Types: Cigarettes    Quit date: 05/05/1981  . Smokeless tobacco: Never Used     Comment: QUIT SMOKING 34 YRS AGO--  APPROX. 1980'S  . Alcohol use No     Comment: quit 34 yrs ago  . Drug use: No  . Sexual activity: Not on file   Other Topics Concern  . Not on file   Social History Narrative  . No narrative on file   Physical Exam  Constitutional: He is oriented to person, place, and time. He appears well-developed and well-nourished.  HENT:  Right Ear: External ear normal.  Left Ear: External ear normal.  Nose: Nose normal.  Subcutaneous fluid collection  Eyes: Conjunctivae and EOM are normal. Pupils are equal, round, and reactive to light.  Neck: Normal range of motion. Neck supple.  Cardiovascular: Normal rate, regular rhythm, normal heart sounds and intact distal pulses.   Pulmonary/Chest: Effort normal and breath sounds normal.  Abdominal: Soft. Bowel sounds are normal.  Musculoskeletal: Normal range of motion.  Neurological: He is alert and oriented to person, place, and time. He has normal strength and normal reflexes. No cranial nerve deficit or sensory deficit. He exhibits normal muscle tone. He displays a negative  Romberg sign. He displays no seizure activity. Coordination and gait normal. GCS eye subscore is 4. GCS verbal subscore is 5. GCS motor subscore is 6. He displays no Babinski's sign on the right side. He displays no Babinski's sign on the left side.  Reflex Scores:      Tricep reflexes are 2+ on the right side and 2+ on the left side.      Bicep reflexes are 2+ on the right side and 2+ on the left side.      Brachioradialis reflexes are 2+ on the right side and 2+ on the left side.      Patellar reflexes are 2+ on the right side and 2+ on the left side.      Achilles reflexes are 2+ on the right side and 2+ on the left side. Skin: Skin is warm and dry.  Psychiatric: He has a normal mood and affect. His behavior is normal. Judgment and thought content normal.  admit for needle aspiration of subcutaneous fluid.

## 2015-12-31 NOTE — Transfer of Care (Signed)
Immediate Anesthesia Transfer of Care Note  Patient: Geoffrey Neal  Procedure(s) Performed: Procedure(s): ASPIRATION OF SUBCUTANEOUS FLUID COLLECTION of Cranial Defect (N/A)  Patient Location: PACU  Anesthesia Type:MAC  Level of Consciousness: awake, alert  and oriented  Airway & Oxygen Therapy: Patient Spontanous Breathing  Post-op Assessment: Report given to RN and Post -op Vital signs reviewed and stable  Post vital signs: Reviewed and stable  Last Vitals:  Vitals:   12/31/15 1000  BP: (!) 179/97  Pulse: 74  Resp: 18  Temp: 37.2 C    Last Pain:  Vitals:   12/31/15 1014  TempSrc:   PainSc: 1          Complications: No apparent anesthesia complications

## 2015-12-31 NOTE — Op Note (Addendum)
12/31/2015  2:58 PM  PATIENT:  Geoffrey Neal  70 y.o. male With a subgaleal fluid collection. I will plan on aspirating the fluid collection percutaneously. PRE-OPERATIVE DIAGNOSIS:  FLUID COLLECTION AT SURGICAL SITE  POST-OPERATIVE DIAGNOSIS:  FLUID COLLECTION AT SURGICAL SITE  PROCEDURE:  Procedure(s): ASPIRATION OF SUBCUTANEOUS FLUID COLLECTION of Cranial Defect  SURGEON: Surgeon(s): Ashok Pall, MD  ASSISTANTS:none  ANESTHESIA:   local and IV sedation  EBL:  Total I/O In: 400 [I.V.:400] Out: 1 [Blood:1]  BLOOD ADMINISTERED:none  CELL SAVER GIVEN:none  COUNT:per nursing  DRAINS: none   SPECIMEN:  No Specimen  DICTATION: Geoffrey Neal was taken to the operating room. He was given iv sedation. I  prepped and draped his forehead in a sterile manner. I injected lidocaine into the scalp. I then aspirated ~210cc of golden fluid from the subgaleal space. I then wrapped his head with an ace wrap. He tolerated the procedure well.   PLAN OF CARE: Discharge to home after PACU  PATIENT DISPOSITION:  PACU - hemodynamically stable.   Delay start of Pharmacological VTE agent (>24hrs) due to surgical blood loss or risk of bleeding:  no

## 2015-12-31 NOTE — Anesthesia Preprocedure Evaluation (Signed)
Anesthesia Evaluation  Patient identified by MRN, date of birth, ID band Patient awake    Reviewed: Allergy & Precautions, NPO status , Patient's Chart, lab work & pertinent test results  Airway Mallampati: II  TM Distance: >3 FB Neck ROM: Full    Dental  (+) Partial Upper, Dental Advisory Given   Pulmonary former smoker,    breath sounds clear to auscultation       Cardiovascular  Rhythm:Regular Rate:Normal     Neuro/Psych    GI/Hepatic   Endo/Other  diabetes  Renal/GU      Musculoskeletal   Abdominal   Peds  Hematology   Anesthesia Other Findings   Reproductive/Obstetrics                             Anesthesia Physical Anesthesia Plan  ASA: III  Anesthesia Plan: General   Post-op Pain Management:    Induction: Intravenous  Airway Management Planned: Oral ETT  Additional Equipment:   Intra-op Plan:   Post-operative Plan:   Informed Consent: I have reviewed the patients History and Physical, chart, labs and discussed the procedure including the risks, benefits and alternatives for the proposed anesthesia with the patient or authorized representative who has indicated his/her understanding and acceptance.   Dental advisory given  Plan Discussed with: CRNA and Anesthesiologist  Anesthesia Plan Comments:         Anesthesia Quick Evaluation

## 2016-01-01 LAB — HEMOGLOBIN A1C
Hgb A1c MFr Bld: 6.9 % — ABNORMAL HIGH (ref 4.8–5.6)
Mean Plasma Glucose: 151 mg/dL

## 2016-01-03 ENCOUNTER — Encounter (HOSPITAL_COMMUNITY): Payer: Self-pay | Admitting: Neurosurgery

## 2016-01-10 ENCOUNTER — Other Ambulatory Visit: Payer: Self-pay | Admitting: Neurosurgery

## 2016-01-11 ENCOUNTER — Encounter (HOSPITAL_COMMUNITY): Payer: Self-pay | Admitting: *Deleted

## 2016-01-11 MED ORDER — CEFAZOLIN SODIUM-DEXTROSE 2-4 GM/100ML-% IV SOLN
2.0000 g | INTRAVENOUS | Status: AC
  Administered 2016-01-12: 2 g via INTRAVENOUS
  Filled 2016-01-11: qty 100

## 2016-01-11 NOTE — Progress Notes (Signed)
Mr Gribbins reports that CBC has  Been in low 100's.  I instructed patient to check CBG to check CBG and if it is less than 70 to treat it with Glucose Gel, Glucose tablets or 1/2 cup of clear juice like apple juice or cranberry juice, or 1/2 cup of regular soda. (not cream soda). I instructed patient to recheck CBG in 15 minutes and if CBG is not greater than 70, to  Call [phone] (pre- op). If it is before pre-op opens to retreat as before and recheck CBG in 15 minutes. I told patient to make note of time that liquid is taken and amount, that surgical time may have to be adjusted.  Pre op call completed using [address], [place], [place] # [id number].

## 2016-01-12 ENCOUNTER — Inpatient Hospital Stay (HOSPITAL_COMMUNITY): Payer: Medicare Other | Admitting: Anesthesiology

## 2016-01-12 ENCOUNTER — Encounter (HOSPITAL_COMMUNITY)
Admission: RE | Disposition: A | Discharge status: Home / Self Care | Payer: Self-pay | Source: Ambulatory Visit | Attending: Neurosurgery

## 2016-01-12 ENCOUNTER — Inpatient Hospital Stay (HOSPITAL_COMMUNITY)
Admission: RE | Admit: 2016-01-12 | Discharge: 2016-01-17 | DRG: 026 | Disposition: A | Discharge status: Home / Self Care | Payer: Medicare Other | Source: Ambulatory Visit | Attending: Neurosurgery | Admitting: Neurosurgery

## 2016-01-12 DIAGNOSIS — R569 Unspecified convulsions: Secondary | ICD-10-CM | POA: Diagnosis present

## 2016-01-12 DIAGNOSIS — Z87891 Personal history of nicotine dependence: Secondary | ICD-10-CM | POA: Diagnosis not present

## 2016-01-12 DIAGNOSIS — Z7984 Long term (current) use of oral hypoglycemic drugs: Secondary | ICD-10-CM

## 2016-01-12 DIAGNOSIS — E782 Mixed hyperlipidemia: Secondary | ICD-10-CM | POA: Diagnosis present

## 2016-01-12 DIAGNOSIS — Z79899 Other long term (current) drug therapy: Secondary | ICD-10-CM

## 2016-01-12 DIAGNOSIS — G9782 Other postprocedural complications and disorders of nervous system: Principal | ICD-10-CM | POA: Diagnosis present

## 2016-01-12 DIAGNOSIS — G9600 Cerebrospinal fluid leak, unspecified: Secondary | ICD-10-CM | POA: Diagnosis present

## 2016-01-12 DIAGNOSIS — E118 Type 2 diabetes mellitus with unspecified complications: Secondary | ICD-10-CM | POA: Diagnosis present

## 2016-01-12 DIAGNOSIS — Y838 Other surgical procedures as the cause of abnormal reaction of the patient, or of later complication, without mention of misadventure at the time of the procedure: Secondary | ICD-10-CM | POA: Diagnosis present

## 2016-01-12 DIAGNOSIS — E039 Hypothyroidism, unspecified: Secondary | ICD-10-CM | POA: Diagnosis present

## 2016-01-12 HISTORY — PX: PLACEMENT OF LUMBAR DRAIN: SHX6028

## 2016-01-12 HISTORY — PX: CRANIOTOMY: SHX93

## 2016-01-12 LAB — BASIC METABOLIC PANEL
Anion gap: 8 (ref 5–15)
BUN: 12 mg/dL (ref 6–20)
CALCIUM: 9 mg/dL (ref 8.9–10.3)
CO2: 24 mmol/L (ref 22–32)
Chloride: 108 mmol/L (ref 101–111)
Creatinine, Ser: 0.85 mg/dL (ref 0.61–1.24)
GFR calc Af Amer: >60 mL/min (ref >60)
GLUCOSE: 129 mg/dL — AB (ref 65–99)
POTASSIUM: 4.2 mmol/L (ref 3.5–5.1)
Sodium: 140 mmol/L (ref 135–145)

## 2016-01-12 LAB — CBC
HEMATOCRIT: 39.7 % (ref 39.0–52.0)
Hemoglobin: 12.6 g/dL — ABNORMAL LOW (ref 13.0–17.0)
MCH: 24.5 pg — AB (ref 26.0–34.0)
MCHC: 31.7 g/dL (ref 30.0–36.0)
MCV: 77.1 fL — ABNORMAL LOW (ref 78.0–100.0)
Platelets: 243 10*3/uL (ref 150–400)
RBC: 5.15 MIL/uL (ref 4.22–5.81)
RDW: 16.4 % — AB (ref 11.5–15.5)
WBC: 6.9 10*3/uL (ref 4.0–10.5)

## 2016-01-12 LAB — GLUCOSE, CAPILLARY
GLUCOSE-CAPILLARY: 140 mg/dL — AB (ref 65–99)
GLUCOSE-CAPILLARY: 174 mg/dL — AB (ref 65–99)

## 2016-01-12 LAB — TYPE AND SCREEN
ABO/RH(D): B POS
ANTIBODY SCREEN: NEGATIVE

## 2016-01-12 SURGERY — CRANIOTOMY REPAIR DURAL/CENTRAL SPINAL FLUID LEAK
Anesthesia: General

## 2016-01-12 MED ORDER — SODIUM CHLORIDE 0.9 % IV SOLN
INTRAVENOUS | Status: DC | PRN
  Administered 2016-01-12: 10:00:00 via INTRAVENOUS

## 2016-01-12 MED ORDER — ADULT MULTIVITAMIN W/MINERALS CH
ORAL_TABLET | Freq: Every day | ORAL | Status: DC
  Administered 2016-01-13 – 2016-01-16 (×3): 1 via ORAL
  Filled 2016-01-12 (×4): qty 1

## 2016-01-12 MED ORDER — FENTANYL CITRATE (PF) 100 MCG/2ML IJ SOLN
INTRAMUSCULAR | Status: AC
  Filled 2016-01-12: qty 2

## 2016-01-12 MED ORDER — LEVETIRACETAM 500 MG PO TABS
500.0000 mg | ORAL_TABLET | Freq: Two times a day (BID) | ORAL | Status: DC
  Administered 2016-01-12 – 2016-01-17 (×10): 500 mg via ORAL
  Filled 2016-01-12 (×10): qty 1

## 2016-01-12 MED ORDER — LACTATED RINGERS IV SOLN
INTRAVENOUS | Status: DC
  Administered 2016-01-12: 50 mL/h via INTRAVENOUS

## 2016-01-12 MED ORDER — ONDANSETRON HCL 4 MG PO TABS
4.0000 mg | ORAL_TABLET | ORAL | Status: DC | PRN
  Filled 2016-01-12: qty 1

## 2016-01-12 MED ORDER — ACETAMINOPHEN 325 MG PO TABS: 325.0000 mg | ORAL_TABLET | ORAL | Status: DC | PRN

## 2016-01-12 MED ORDER — LIDOCAINE HCL (CARDIAC) 20 MG/ML IV SOLN
INTRAVENOUS | Status: DC | PRN
  Administered 2016-01-12: 100 mg via INTRATRACHEAL

## 2016-01-12 MED ORDER — MEPERIDINE HCL 25 MG/ML IJ SOLN: 6.2500 mg | INTRAMUSCULAR | Status: DC | PRN

## 2016-01-12 MED ORDER — CEFAZOLIN IN D5W 1 GM/50ML IV SOLN
1.0000 g | Freq: Three times a day (TID) | INTRAVENOUS | Status: DC
  Administered 2016-01-12 – 2016-01-17 (×14): 1 g via INTRAVENOUS
  Filled 2016-01-12 (×16): qty 50

## 2016-01-12 MED ORDER — ROCURONIUM BROMIDE 10 MG/ML (PF) SYRINGE
PREFILLED_SYRINGE | INTRAVENOUS | Status: AC
  Filled 2016-01-12: qty 10

## 2016-01-12 MED ORDER — ARTIFICIAL TEARS OP OINT
TOPICAL_OINTMENT | OPHTHALMIC | Status: DC | PRN
  Administered 2016-01-12: 1 via OPHTHALMIC

## 2016-01-12 MED ORDER — ONDANSETRON HCL 4 MG/2ML IJ SOLN
4.0000 mg | INTRAMUSCULAR | Status: DC | PRN
  Administered 2016-01-12 – 2016-01-15 (×6): 4 mg via INTRAVENOUS
  Filled 2016-01-12 (×6): qty 2

## 2016-01-12 MED ORDER — HYDROCODONE-ACETAMINOPHEN 5-325 MG PO TABS: 1.0000 | ORAL_TABLET | ORAL | Status: DC | PRN

## 2016-01-12 MED ORDER — PROPOFOL 10 MG/ML IV BOLUS
INTRAVENOUS | Status: AC
  Filled 2016-01-12: qty 20

## 2016-01-12 MED ORDER — MAGNESIUM CITRATE PO SOLN
1.0000 | Freq: Once | ORAL | Status: DC | PRN
Start: 2016-01-12 — End: 2016-01-17

## 2016-01-12 MED ORDER — ROCURONIUM BROMIDE 100 MG/10ML IV SOLN
INTRAVENOUS | Status: DC | PRN
  Administered 2016-01-12: 30 mg via INTRAVENOUS
  Administered 2016-01-12: 50 mg via INTRAVENOUS

## 2016-01-12 MED ORDER — ONDANSETRON HCL 4 MG/2ML IJ SOLN
INTRAMUSCULAR | Status: DC | PRN
  Administered 2016-01-12: 4 mg via INTRAVENOUS

## 2016-01-12 MED ORDER — LIDOCAINE 2% (20 MG/ML) 5 ML SYRINGE
INTRAMUSCULAR | Status: AC
  Filled 2016-01-12: qty 5

## 2016-01-12 MED ORDER — TOPIRAMATE 25 MG PO TABS
25.0000 mg | ORAL_TABLET | Freq: Every day | ORAL | Status: DC
  Administered 2016-01-12 – 2016-01-16 (×5): 25 mg via ORAL
  Filled 2016-01-12 (×5): qty 1

## 2016-01-12 MED ORDER — DOCUSATE SODIUM 100 MG PO CAPS
100.0000 mg | ORAL_CAPSULE | Freq: Two times a day (BID) | ORAL | Status: DC
  Administered 2016-01-13 – 2016-01-15 (×5): 100 mg via ORAL
  Filled 2016-01-12 (×7): qty 1

## 2016-01-12 MED ORDER — 0.9 % SODIUM CHLORIDE (POUR BTL) OPTIME
TOPICAL | Status: DC | PRN
  Administered 2016-01-12: 1000 mL

## 2016-01-12 MED ORDER — SUCCINYLCHOLINE CHLORIDE 20 MG/ML IJ SOLN
INTRAMUSCULAR | Status: DC | PRN
  Administered 2016-01-12: 120 mg via INTRAVENOUS

## 2016-01-12 MED ORDER — PROMETHAZINE HCL 25 MG PO TABS: 12.5000 mg | ORAL_TABLET | ORAL | Status: DC | PRN

## 2016-01-12 MED ORDER — FAMOTIDINE IN NACL 20-0.9 MG/50ML-% IV SOLN
20.0000 mg | Freq: Two times a day (BID) | INTRAVENOUS | Status: DC
  Administered 2016-01-12 – 2016-01-14 (×5): 20 mg via INTRAVENOUS
  Filled 2016-01-12 (×5): qty 50

## 2016-01-12 MED ORDER — THROMBIN 20000 UNITS EX SOLR
CUTANEOUS | Status: DC | PRN
  Administered 2016-01-12: 09:00:00 via TOPICAL

## 2016-01-12 MED ORDER — MORPHINE SULFATE (PF) 2 MG/ML IV SOLN
1.0000 mg | INTRAVENOUS | Status: DC | PRN
  Administered 2016-01-12 – 2016-01-14 (×2): 1 mg via INTRAVENOUS
  Filled 2016-01-12 (×2): qty 1

## 2016-01-12 MED ORDER — GABAPENTIN 300 MG PO CAPS
300.0000 mg | ORAL_CAPSULE | Freq: Every day | ORAL | Status: DC
  Administered 2016-01-13 – 2016-01-17 (×4): 300 mg via ORAL
  Filled 2016-01-12 (×4): qty 1

## 2016-01-12 MED ORDER — CHLORHEXIDINE GLUCONATE CLOTH 2 % EX PADS: 6.0000 | MEDICATED_PAD | Freq: Once | CUTANEOUS | Status: DC

## 2016-01-12 MED ORDER — HEMOSTATIC AGENTS (NO CHARGE) OPTIME
TOPICAL | Status: DC | PRN
  Administered 2016-01-12: 1 via TOPICAL

## 2016-01-12 MED ORDER — PROMETHAZINE HCL 25 MG/ML IJ SOLN
12.5000 mg | Freq: Four times a day (QID) | INTRAMUSCULAR | Status: DC | PRN
  Administered 2016-01-13 – 2016-01-15 (×2): 12.5 mg via INTRAVENOUS
  Filled 2016-01-12 (×2): qty 1

## 2016-01-12 MED ORDER — LACTATED RINGERS IV SOLN: INTRAVENOUS | Status: DC

## 2016-01-12 MED ORDER — SUGAMMADEX SODIUM 200 MG/2ML IV SOLN
INTRAVENOUS | Status: DC | PRN
  Administered 2016-01-12: 200 mg via INTRAVENOUS

## 2016-01-12 MED ORDER — NALOXONE HCL 0.4 MG/ML IJ SOLN: 0.0800 mg | INTRAMUSCULAR | Status: DC | PRN

## 2016-01-12 MED ORDER — PROMETHAZINE HCL 25 MG/ML IJ SOLN: 6.2500 mg | INTRAMUSCULAR | Status: DC | PRN

## 2016-01-12 MED ORDER — POTASSIUM CHLORIDE IN NACL 20-0.9 MEQ/L-% IV SOLN
INTRAVENOUS | Status: DC
  Administered 2016-01-12: 15:00:00 via INTRAVENOUS
  Administered 2016-01-13: 80 mL/h via INTRAVENOUS
  Administered 2016-01-13 – 2016-01-15 (×2): via INTRAVENOUS
  Administered 2016-01-15: 80 mL/h via INTRAVENOUS
  Administered 2016-01-16: 05:00:00 via INTRAVENOUS
  Filled 2016-01-12 (×10): qty 1000

## 2016-01-12 MED ORDER — LEVOTHYROXINE SODIUM 75 MCG PO TABS
175.0000 ug | ORAL_TABLET | Freq: Every day | ORAL | Status: DC
  Administered 2016-01-13 – 2016-01-17 (×4): 175 ug via ORAL
  Filled 2016-01-12 (×4): qty 1

## 2016-01-12 MED ORDER — FENTANYL CITRATE (PF) 100 MCG/2ML IJ SOLN
INTRAMUSCULAR | Status: DC | PRN
  Administered 2016-01-12 (×2): 100 ug via INTRAVENOUS

## 2016-01-12 MED ORDER — METFORMIN HCL 500 MG PO TABS
500.0000 mg | ORAL_TABLET | Freq: Two times a day (BID) | ORAL | Status: DC
  Administered 2016-01-13 – 2016-01-17 (×6): 500 mg via ORAL
  Filled 2016-01-12 (×8): qty 1

## 2016-01-12 MED ORDER — LABETALOL HCL 5 MG/ML IV SOLN
10.0000 mg | INTRAVENOUS | Status: DC | PRN
  Administered 2016-01-12: 10 mg via INTRAVENOUS
  Filled 2016-01-12: qty 4

## 2016-01-12 MED ORDER — HYDROMORPHONE HCL 1 MG/ML IJ SOLN
INTRAMUSCULAR | Status: AC
  Filled 2016-01-12: qty 1

## 2016-01-12 MED ORDER — HYDROMORPHONE HCL 1 MG/ML IJ SOLN
0.2500 mg | INTRAMUSCULAR | Status: DC | PRN
  Administered 2016-01-12 (×2): 0.5 mg via INTRAVENOUS

## 2016-01-12 MED ORDER — DEXTROSE 5 % IV SOLN
INTRAVENOUS | Status: DC | PRN
  Administered 2016-01-12: 20 ug/min via INTRAVENOUS

## 2016-01-12 SURGICAL SUPPLY — 76 items
APL SKNCLS STERI-STRIP NONHPOA (GAUZE/BANDAGES/DRESSINGS)
APPLICATOR COTTON TIP 6IN STRL (MISCELLANEOUS) ×2 IMPLANT
BANDAGE GAUZE 4  KLING STR (GAUZE/BANDAGES/DRESSINGS) ×2 IMPLANT
BENZOIN TINCTURE PRP APPL 2/3 (GAUZE/BANDAGES/DRESSINGS) IMPLANT
BLADE CLIPPER SURG (BLADE) ×4 IMPLANT
BNDG GAUZE ELAST 4 BULKY (GAUZE/BANDAGES/DRESSINGS) ×2 IMPLANT
BUR ACORN 6.0 PRECISION (BURR) IMPLANT
BUR ACORN 6.0MM PRECISION (BURR)
BUR MATCHSTICK NEURO 3.0 LAGG (BURR) IMPLANT
BUR SPIRAL ROUTER 2.3 (BUR) IMPLANT
BUR SPIRAL ROUTER 2.3MM (BUR)
CANISTER SUCT 3000ML PPV (MISCELLANEOUS) ×4 IMPLANT
CLIP TI MEDIUM 6 (CLIP) IMPLANT
CORDS BIPOLAR (ELECTRODE) ×4 IMPLANT
DRAIN SUBARACHNOID (WOUND CARE) ×4 IMPLANT
DRAPE NEUROLOGICAL W/INCISE (DRAPES) ×4 IMPLANT
DRAPE PROXIMA HALF (DRAPES) ×2 IMPLANT
DRAPE SURG 17X23 STRL (DRAPES) IMPLANT
DRAPE WARM FLUID 44X44 (DRAPE) ×4 IMPLANT
DRSG TEGADERM 4X4.75 (GAUZE/BANDAGES/DRESSINGS) ×2 IMPLANT
DURAPREP 6ML APPLICATOR 50/CS (WOUND CARE) ×4 IMPLANT
ELECT CAUTERY BLADE 6.4 (BLADE) ×4 IMPLANT
ELECT REM PT RETURN 9FT ADLT (ELECTROSURGICAL) ×4
ELECTRODE REM PT RTRN 9FT ADLT (ELECTROSURGICAL) ×2 IMPLANT
EVACUATOR 1/8 PVC DRAIN (DRAIN) IMPLANT
EVACUATOR SILICONE 100CC (DRAIN) IMPLANT
GAUZE SPONGE 4X4 12PLY STRL (GAUZE/BANDAGES/DRESSINGS) ×4 IMPLANT
GAUZE SPONGE 4X4 16PLY XRAY LF (GAUZE/BANDAGES/DRESSINGS) IMPLANT
GLOVE BIO SURGEON STRL SZ7 (GLOVE) ×2 IMPLANT
GLOVE BIO SURGEON STRL SZ8 (GLOVE) ×2 IMPLANT
GLOVE BIO SURGEON STRL SZ8.5 (GLOVE) ×2 IMPLANT
GLOVE ECLIPSE 6.5 STRL STRAW (GLOVE) ×4 IMPLANT
GLOVE EXAM NITRILE LRG STRL (GLOVE) IMPLANT
GLOVE EXAM NITRILE XL STR (GLOVE) IMPLANT
GLOVE EXAM NITRILE XS STR PU (GLOVE) IMPLANT
GLOVE INDICATOR 7.0 STRL GRN (GLOVE) ×2 IMPLANT
GLOVE INDICATOR 7.5 STRL GRN (GLOVE) ×2 IMPLANT
GOWN STRL REUS W/ TWL LRG LVL3 (GOWN DISPOSABLE) ×4 IMPLANT
GOWN STRL REUS W/ TWL XL LVL3 (GOWN DISPOSABLE) IMPLANT
GOWN STRL REUS W/TWL 2XL LVL3 (GOWN DISPOSABLE) IMPLANT
GOWN STRL REUS W/TWL LRG LVL3 (GOWN DISPOSABLE) ×8
GOWN STRL REUS W/TWL XL LVL3 (GOWN DISPOSABLE) ×4
GRAFT DURAGEN MATRIX 1WX1L (Tissue) ×2 IMPLANT
GRAFT DURAGEN MATRIX 2WX2L ×2 IMPLANT
HEMOSTAT SURGICEL 2X14 (HEMOSTASIS) IMPLANT
HOOK DURA 1/2IN (MISCELLANEOUS) ×2 IMPLANT
KIT BASIN OR (CUSTOM PROCEDURE TRAY) ×4 IMPLANT
KIT DRAIN CSF ACCUDRAIN (MISCELLANEOUS) ×2 IMPLANT
KIT ROOM TURNOVER OR (KITS) ×4 IMPLANT
NDL HYPO 25X1 1.5 SAFETY (NEEDLE) ×2 IMPLANT
NEEDLE HYPO 25X1 1.5 SAFETY (NEEDLE) ×4 IMPLANT
NS IRRIG 1000ML POUR BTL (IV SOLUTION) ×6 IMPLANT
PACK CRANIOTOMY (CUSTOM PROCEDURE TRAY) ×4 IMPLANT
PATTIES SURGICAL .5 X.5 (GAUZE/BANDAGES/DRESSINGS) IMPLANT
PATTIES SURGICAL .5 X3 (DISPOSABLE) IMPLANT
PATTIES SURGICAL 1X1 (DISPOSABLE) IMPLANT
SCREW SELF DRILL HT 1.5/4MM (Screw) ×16 IMPLANT
SEALANT ADHERUS EXTEND TIP (MISCELLANEOUS) ×2 IMPLANT
SPONGE NEURO XRAY DETECT 1X3 (DISPOSABLE) IMPLANT
SPONGE SURGIFOAM ABS GEL 100 (HEMOSTASIS) ×4 IMPLANT
STAPLER VISISTAT 35W (STAPLE) ×4 IMPLANT
SUT ETHILON 3 0 FSL (SUTURE) ×4 IMPLANT
SUT ETHILON 3 0 PS 1 (SUTURE) IMPLANT
SUT NURALON 4 0 TR CR/8 (SUTURE) ×12 IMPLANT
SUT STEEL 0 (SUTURE)
SUT STEEL 0 18XMFL TIE 17 (SUTURE) IMPLANT
SUT VIC AB 2-0 CT2 18 VCP726D (SUTURE) ×14 IMPLANT
SYR CONTROL 10ML LL (SYRINGE) ×4 IMPLANT
TOWEL OR 17X24 6PK STRL BLUE (TOWEL DISPOSABLE) ×4 IMPLANT
TOWEL OR 17X26 10 PK STRL BLUE (TOWEL DISPOSABLE) ×4 IMPLANT
TRAY FOLEY CATH 16FRSI W/METER (SET/KITS/TRAYS/PACK) ×2 IMPLANT
TRAY FOLEY W/METER SILVER 16FR (SET/KITS/TRAYS/PACK) ×4 IMPLANT
TUBE CONNECTING 12'X1/4 (SUCTIONS) ×1
TUBE CONNECTING 12X1/4 (SUCTIONS) ×3 IMPLANT
UNDERPAD 30X30 (UNDERPADS AND DIAPERS) ×2 IMPLANT
WATER STERILE IRR 1000ML POUR (IV SOLUTION) ×4 IMPLANT

## 2016-01-12 NOTE — Anesthesia Postprocedure Evaluation (Signed)
Anesthesia Post Note  Patient: Geoffrey Neal  Procedure(s) Performed: Procedure(s) (LRB): CRANIOTOMY FOR  CEREBRO SPINAL FLUID LEAK (N/A) PLACEMENT OF LUMBAR DRAIN  Patient location during evaluation: PACU Anesthesia Type: General Level of consciousness: awake and alert Pain management: pain level controlled Vital Signs Assessment: post-procedure vital signs reviewed and stable Respiratory status: spontaneous breathing, nonlabored ventilation, respiratory function stable and patient connected to nasal cannula oxygen Cardiovascular status: blood pressure returned to baseline and stable Postop Assessment: no signs of nausea or vomiting Anesthetic complications: no    Last Vitals:  Vitals:   01/12/16 1330 01/12/16 1354  BP: (!) 152/97   Pulse: 81 81  Resp: 13 13  Temp:  36.1 C    Last Pain:  Vitals:   01/12/16 1354  TempSrc:   PainSc: Asleep                 Effie Berkshire

## 2016-01-12 NOTE — Anesthesia Procedure Notes (Signed)
Procedure Name: Intubation Date/Time: 01/12/2016 9:58 AM Performed by: Mariea Clonts Pre-anesthesia Checklist: Patient identified, Emergency Drugs available, Suction available and Patient being monitored Patient Re-evaluated:Patient Re-evaluated prior to inductionOxygen Delivery Method: Circle System Utilized Preoxygenation: Pre-oxygenation with 100% oxygen Intubation Type: IV induction Ventilation: Mask ventilation without difficulty Laryngoscope Size: Miller and 2 Grade View: Grade I Tube type: Oral Tube size: 7.5 mm Number of attempts: 1 Airway Equipment and Method: Stylet and Oral airway Placement Confirmation: ETT inserted through vocal cords under direct vision,  positive ETCO2 and breath sounds checked- equal and bilateral Tube secured with: Tape Dental Injury: Teeth and Oropharynx as per pre-operative assessment

## 2016-01-12 NOTE — Transfer of Care (Signed)
Immediate Anesthesia Transfer of Care Note  Patient: Geoffrey Neal  Procedure(s) Performed: Procedure(s): CRANIOTOMY FOR  CEREBRO SPINAL FLUID LEAK (N/A) PLACEMENT OF LUMBAR DRAIN  Patient Location: PACU  Anesthesia Type:General  Level of Consciousness: awake, alert  and oriented  Airway & Oxygen Therapy: Patient Spontanous Breathing and Patient connected to nasal cannula oxygen  Post-op Assessment: Report given to RN, Post -op Vital signs reviewed and stable and Patient moving all extremities X 4  Post vital signs: Reviewed and stable  Last Vitals:  Vitals:   01/12/16 1245 01/12/16 1300  BP: (!) 152/94 (!) 149/95  Pulse: 92 84  Resp: (!) 36 19  Temp:      Last Pain:  Vitals:   01/12/16 0825  TempSrc:   PainSc: 0-No pain      Patients Stated Pain Goal: 3 ([id number])  Complications: No apparent anesthesia complications

## 2016-01-12 NOTE — Op Note (Signed)
01/12/2016  1:35 PM  PATIENT:  Geoffrey Neal  70 y.o. male with a spinal fluid leak status post a craniotomy and cranioplasty for a meningioma with an intraosseus component in 04/2014.  PRE-OPERATIVE DIAGNOSIS:  CEREBRO SPINAL FLUID LEAK  POST-OPERATIVE DIAGNOSIS:  CEREBRO SPINAL FLUID LEAK  PROCEDURE:  Procedure(s): CRANIOTOMY FOR  CEREBRO SPINAL FLUID LEAK, onlay graFt PLACEMENT OF LUMBAR DRAIN  SURGEON: Surgeon(s): Ashok Pall, MD Newman Pies, MD  ASSISTANTS:Jenkins, Dellis Filbert  ANESTHESIA:   general  EBL:  Total I/O In: 1400 [I.V.:1400] Out: 550 [Urine:500; Blood:50]  BLOOD ADMINISTERED:none  CELL SAVER GIVEN:none  COUNT:per nursing  DRAINS: none and lumbar drain   SPECIMEN:  No Specimen  DICTATION: [name] was taken to the operating room, intubated, and placed under a general anesthetic without difficulty. Geoffrey Neal was positioned supine on the operating room table with his head supported by a horseshoe headrest in a head up position. His head was prepped and draped in a sterile manner. I opened the old bicoronal incision exposing the cranioplasty. I removed the skull side screws, and removed the cranioplasty flap. The dura was intact, but with close inspection I could appreciate csf emanating from a neurolon suture along the midline suture line. I decided that resuturing the dura would not result in the desired result. I laid onlay dural substitute on the leak site, and used Adherus dural sealant to cover the onlay. Dr. Arnoldo Morale and I replaced the cranioplasty , then closed the wound in a layered fashion approximating the temporalis muscles, the galea, and finally the scalp with vicryl sutures, and nylon on the scalp. I placed a sterile dressing.  We then turned the patient on his side for the lumbar drain placement. I prepped the lumbar region with duraprep. I placed a touhy needle, then the catheter. I placed a sterile dressing on the catheter.     PLAN OF  CARE: Admit to inpatient   PATIENT DISPOSITION:  PACU - hemodynamically stable.   Delay start of Pharmacological VTE agent (>24hrs) due to surgical blood loss or risk of bleeding:  yes

## 2016-01-12 NOTE — Anesthesia Preprocedure Evaluation (Addendum)
Anesthesia Evaluation  Patient identified by MRN, date of birth, ID band Patient awake    Reviewed: Allergy & Precautions, NPO status , Patient's Chart, lab work & pertinent test results  Airway Mallampati: I  TM Distance: >3 FB Neck ROM: Full    Dental  (+) Missing, Dental Advisory Given,    Pulmonary former smoker,    breath sounds clear to auscultation       Cardiovascular negative cardio ROS   Rhythm:Regular Rate:Normal     Neuro/Psych Seizures -, Well Controlled,  negative psych ROS   GI/Hepatic Neg liver ROS, GERD  ,  Endo/Other  diabetes, Type 2, Oral Hypoglycemic AgentsHypothyroidism   Renal/GU negative Renal ROS  negative genitourinary   Musculoskeletal  (+) Arthritis ,   Abdominal   Peds negative pediatric ROS (+)  Hematology negative hematology ROS (+)   Anesthesia Other Findings Prev Intubation: Grade 1 view w/ Mac 4  Reproductive/Obstetrics negative OB ROS                           Anesthesia Physical Anesthesia Plan  ASA: II  Anesthesia Plan: General   Post-op Pain Management:    Induction: Intravenous  Airway Management Planned: Oral ETT  Additional Equipment: Arterial line  Intra-op Plan:   Post-operative Plan: Extubation in OR  Informed Consent: I have reviewed the patients History and Physical, chart, labs and discussed the procedure including the risks, benefits and alternatives for the proposed anesthesia with the patient or authorized representative who has indicated his/her understanding and acceptance.   Dental advisory given  Plan Discussed with: CRNA  Anesthesia Plan Comments: (12 lead EKG??)        Anesthesia Quick Evaluation

## 2016-01-12 NOTE — H&P (Addendum)
Geoffrey Neal has developed a csf leak remote from his craniotomy for meningioma resection. He is admitted today for csf leak repair via a craniotomy. A subcutaneous aspiration done earlier this month only resulted in a reaccumulation of the fluid.  Allergies  Allergen Reactions  . Lemon Flavor Rash  . Orange Fruit [Citrus] Rash   Past Medical History:  Diagnosis Date  . Blurred vision    "fluid in head"-   . Complication of anesthesia    " had difficulty waking up."  . Diabetes mellitus without complication ([place])    ?- patient states prediabetes  . DJD (degenerative joint disease)   . Elevated PSA   . Frequency of urination   . GERD (gastroesophageal reflux disease)    occ  . History of kidney stones    yrs ago- patient denies- "it was an Infection".  . Hypothyroidism   . Mixed hyperlipidemia    MILD PER PT  . Nocturia   . Seizures ([place])    x 2 2016- "none since taking Gabapentin"   Past Surgical History:  Procedure Laterality Date  . COLONOSCOPY W/ POLYPECTOMY     x 2  . CRANIOPLASTY N/A 05/22/2014   Procedure: CRANIOPLASTY;  Surgeon: Ashok Pall, MD;  Location: [facility];  Service: Neurosurgery;  Laterality: N/A;  Cranioplasty  . CRANIOTOMY Bilateral 05/08/2014   Procedure: BILATERAL FRONTAL PARIETAL CRANIOTOMY TUMOR EXCISION;  Surgeon: Ashok Pall, MD;  Location: [facility];  Service: Neurosurgery;  Laterality: Bilateral;  . HEMATOMA EVACUATION N/A 12/31/2015   Procedure: ASPIRATION OF SUBCUTANEOUS FLUID COLLECTION of Cranial Defect;  Surgeon: Ashok Pall, MD;  Location: [facility];  Service: Neurosurgery;  Laterality: N/A;  . PROSTATE BIOPSY N/A 07/19/2012   Procedure: EUA, PROSTATE BIOPSY TRANSRECTAL ULTRASONIC PROSTATE (TUBP);  Surgeon: Fredricka Bonine, MD;  Location: [facility];  Service: Urology;  Laterality: N/A;  . RADIOLOGY WITH ANESTHESIA N/A 05/07/2014   Procedure: RADIOLOGY WITH ANESTHESIA/EMBOLIZATION;  Surgeon: Rob Hickman, MD;   Location: [facility];  Service: Radiology;  Laterality: N/A;  . TONSILLECTOMY     History reviewed. No pertinent family history. Social History   Social History  . Marital status: Married    Spouse name: N/A  . Number of children: N/A  . Years of education: N/A   Occupational History  . Not on file.   Social History Main Topics  . Smoking status: Former Smoker    Years: 15.00    Types: Cigarettes    Quit date: 05/05/1981  . Smokeless tobacco: Never Used     Comment: QUIT SMOKING 34 YRS AGO--  APPROX. 1980'S  . Alcohol use No     Comment: quit 34 yrs ago  . Drug use: No  . Sexual activity: Not on file   Other Topics Concern  . Not on file   Social History Narrative  . No narrative on file   History reviewed. No pertinent family history. Prior to Admission medications   Medication Sig Start Date End Date Taking? Authorizing Provider  gabapentin (NEURONTIN) 300 MG capsule Take 300 mg by mouth every morning.    Yes Historical Provider, MD  levothyroxine (SYNTHROID, LEVOTHROID) 175 MCG tablet Take 1 tablet (175 mcg total) by mouth daily before breakfast. 06/05/14  Yes Lavon Paganini Angiulli, PA-C  metFORMIN (GLUCOPHAGE) 500 MG tablet Take 500 mg by mouth 2 (two) times daily with a meal.   Yes Historical Provider, MD  Multiple Vitamins-Minerals (CENTRUM SILVER PO) Take 1 tablet by mouth  daily.   Yes Historical Provider, MD  OVER THE COUNTER MEDICATION Take 1 tablet by mouth daily. Supplement for Metabolism   Yes Historical Provider, MD  acetaminophen (TYLENOL) 325 MG tablet Take 1-2 tablets (325-650 mg total) by mouth every 4 (four) hours as needed for mild pain. Patient not taking: Reported on 01/12/2016 06/05/14   Lavon Paganini Angiulli, PA-C  levETIRAcetam (KEPPRA) 500 MG tablet Take 1 tablet (500 mg total) by mouth 2 (two) times daily. Patient not taking: Reported on 01/12/2016 06/05/14   Lavon Paganini Angiulli, PA-C  topiramate (TOPAMAX) 25 MG tablet Take 1 tablet (25 mg total) by mouth at  bedtime. Patient not taking: Reported on 01/12/2016 06/05/14   Cathlyn Parsons, PA-C  Physical Exam  Constitutional: He is oriented to person, place, and time. He appears well-developed and well-nourished.  HENT:  Right Ear: External ear normal.  Left Ear: External ear normal.  Nose: Nose normal.  Mouth/Throat: Oropharynx is clear and moist.  Subcutaneous fluid collection.   Eyes: Conjunctivae and EOM are normal.  Neck: Normal range of motion. Neck supple.  Cardiovascular: Normal rate, regular rhythm, normal heart sounds and intact distal pulses.   Pulmonary/Chest: Effort normal and breath sounds normal.  Abdominal: Soft. Bowel sounds are normal.  Musculoskeletal: Normal range of motion.  Neurological: He is alert and oriented to person, place, and time. He has normal reflexes.  Skin: Skin is warm and dry.  Psychiatric: He has a normal mood and affect. His behavior is normal. Judgment and thought content normal.    Geoffrey Neal has agreed to undergo a craniotomy for csf leak repair. Bleeding, infection, need for further surgery, continued leak, brain damage, and other risks were explained. He understands and wishes to proceed.

## 2016-01-12 NOTE — Progress Notes (Signed)
Pt continues to be nauseated and has had 2 vomiting episodes in the last 2 hours. Called Neurosurgery, Dr. Arnoldo Morale to get his refractory nausea medication changed to IV administration instead of oral route.  Pt also had a CBG check and his blood sugar was 174, note says to notify the MD when it is >140 to see if SSI is needed. Dr. Arnoldo Morale gave me verbal orders to start a moderate SSI for the pt.

## 2016-01-13 ENCOUNTER — Encounter (HOSPITAL_COMMUNITY): Payer: Self-pay | Admitting: Neurosurgery

## 2016-01-13 LAB — GLUCOSE, CAPILLARY
GLUCOSE-CAPILLARY: 113 mg/dL — AB (ref 65–99)
Glucose-Capillary: 121 mg/dL — ABNORMAL HIGH (ref 65–99)
Glucose-Capillary: 123 mg/dL — ABNORMAL HIGH (ref 65–99)
Glucose-Capillary: 150 mg/dL — ABNORMAL HIGH (ref 65–99)
Glucose-Capillary: 185 mg/dL — ABNORMAL HIGH (ref 65–99)

## 2016-01-13 MED ORDER — HEPARIN SODIUM (PORCINE) 5000 UNIT/ML IJ SOLN: 5000.0000 [IU] | Freq: Three times a day (TID) | INTRAMUSCULAR | Status: DC

## 2016-01-13 MED ORDER — ORAL CARE MOUTH RINSE
15.0000 mL | Freq: Two times a day (BID) | OROMUCOSAL | Status: DC
  Administered 2016-01-13 – 2016-01-14 (×3): 15 mL via OROMUCOSAL

## 2016-01-13 NOTE — Care Management Note (Signed)
Case Management Note  Patient Details  Name: Geoffrey Neal MRN: [id number] Date of Birth: Jan 05, 1946  Subjective/Objective: Pt admitted on 01/12/16 with postoperative CSF leak.  PTA, pt independent, lives with family.  Pt does not speak Engish.                    Action/Plan: Will follow for discharge planning as pt progresses.    Expected Discharge Date:                  Expected Discharge Plan:  Home/Self Care  In-House Referral:     Discharge planning Services  CM Consult  Post Acute Care Choice:    Choice offered to:     DME Arranged:    DME Agency:     HH Arranged:    HH Agency:     Status of Service:  In process, will continue to follow  If discussed at Long Length of Stay Meetings, dates discussed:    Additional Comments:  Reinaldo Raddle, RN, BSN  Trauma/Neuro ICU Case Manager [phone]

## 2016-01-13 NOTE — Progress Notes (Signed)
Patient ID: Geoffrey Neal, male   DOB: 1945-09-22, 70 y.o.   MRN: [id number] BP (!) 166/113   Pulse (!) 59   Temp 98.8 F (37.1 C) (Oral)   Resp 15   Ht 5\' 7"  (1.702 m)   Wt 94.7 kg (208 lb 12.4 oz)   SpO2 97%   BMI 32.70 kg/m  Lethargic, following all commands, moving all extremities Will decrease lumbar drain to 15cc/hr.  Very nauseated, not eating.  perrl full eom Symmetric facies, tongue and uvula midline Will see if decreasing drain rate will help with lethargy.

## 2016-01-14 LAB — GLUCOSE, CAPILLARY
GLUCOSE-CAPILLARY: 112 mg/dL — AB (ref 65–99)
GLUCOSE-CAPILLARY: 135 mg/dL — AB (ref 65–99)
GLUCOSE-CAPILLARY: 162 mg/dL — AB (ref 65–99)
GLUCOSE-CAPILLARY: 149 mg/dL — AB (ref 65–99)
GLUCOSE-CAPILLARY: 117 mg/dL — AB (ref 65–99)
GLUCOSE-CAPILLARY: 123 mg/dL — AB (ref 65–99)
Glucose-Capillary: 139 mg/dL — ABNORMAL HIGH (ref 65–99)

## 2016-01-14 MED ORDER — FAMOTIDINE 20 MG PO TABS
20.0000 mg | ORAL_TABLET | Freq: Two times a day (BID) | ORAL | Status: DC
  Administered 2016-01-14 – 2016-01-16 (×4): 20 mg via ORAL
  Filled 2016-01-14 (×6): qty 1

## 2016-01-14 MED ORDER — PROCHLORPERAZINE 25 MG RE SUPP
25.0000 mg | Freq: Two times a day (BID) | RECTAL | Status: DC | PRN
  Administered 2016-01-14 – 2016-01-15 (×3): 25 mg via RECTAL
  Filled 2016-01-14 (×5): qty 1

## 2016-01-14 NOTE — Progress Notes (Signed)
.  Patient ID: Geoffrey Neal, male   DOB: [birth year], 70 y.o.   MRN: [id number]  BP (!) 169/81   Pulse 67   Temp 98.6 F (37 C) (Oral)   Resp 12   Ht 5\' 7"  (1.702 m)   Wt 94.7 kg (208 lb 12.4 oz)   SpO2 98%   BMI 32.70 kg/m  Alert, fluent speech. Scalp is flat, no fluid appreciated on palpation Lumbar drain working well, 15cc per hour.  Will clamp late tomorrow.

## 2016-01-14 NOTE — Progress Notes (Signed)
Pt awoke and was diaphoretic, c/o nausea, being hot, needing "to get up"  We helped the patient sit up on the side of the bed, applied cold wash cloths, offered anti-emetics, which he initially refused.  Pt vomited a small amount multiple times while sitting on the side of the bed and was encouraged to utilize the medication available to help him for nausea/vomiting.  Pt also c/o of pain in the back of his head radiating down his neck.  Pt finally agreed to take pain medicine and nausea medication, see MAR.   Pt is now lying comfortably back in bed.

## 2016-01-15 LAB — GLUCOSE, CAPILLARY
Glucose-Capillary: 101 mg/dL — ABNORMAL HIGH (ref 65–99)
Glucose-Capillary: 121 mg/dL — ABNORMAL HIGH (ref 65–99)
Glucose-Capillary: 135 mg/dL — ABNORMAL HIGH (ref 65–99)
Glucose-Capillary: 135 mg/dL — ABNORMAL HIGH (ref 65–99)
Glucose-Capillary: 141 mg/dL — ABNORMAL HIGH (ref 65–99)
Glucose-Capillary: 151 mg/dL — ABNORMAL HIGH (ref 65–99)

## 2016-01-15 MED ORDER — HEPARIN SODIUM (PORCINE) 5000 UNIT/ML IJ SOLN
5000.0000 [IU] | Freq: Three times a day (TID) | INTRAMUSCULAR | Status: DC
  Administered 2016-01-15 – 2016-01-17 (×7): 5000 [IU] via SUBCUTANEOUS
  Filled 2016-01-15 (×7): qty 1

## 2016-01-15 NOTE — Plan of Care (Signed)
Problem: Physical Regulation: Goal: Neurologic status will improve Outcome: Completed/Met Date Met: 01/15/16 Patient is alert and oriented X 4

## 2016-01-15 NOTE — Progress Notes (Signed)
Drain clamped at 1600 instead of 1700 as pt drained a large amount of CSF when he stood suddenly. Kept drain clamped per Dr. Lacy Duverney order for 1700.

## 2016-01-15 NOTE — Plan of Care (Signed)
Problem: Coping: Goal: Ability to cope will improve Outcome: Completed/Met Date Met: 01/15/16 Wife and 2 sons at bedside. Patient is calm and cooperative

## 2016-01-15 NOTE — Progress Notes (Signed)
Swelling noticed on right temporal side near suture site. Dr. Christella Noa notified and he assessed at bedside and feels that it is swelling at the op site and is not concerned for a recurrence of a CSF leak. Will continue to monitor and update as needed.

## 2016-01-15 NOTE — Progress Notes (Signed)
Patient ID: Geoffrey Neal, male   DOB: January 30, 1946, 70 y.o.   MRN: [id number] BP (!) 159/95 (BP Location: Left Arm)   Pulse 77   Temp 98.1 F (36.7 C) (Oral)   Resp 17   Ht 5\' 7"  (1.702 m)   Wt 94.7 kg (208 lb 12.4 oz)   SpO2 97%   BMI 32.70 kg/m  Alert and oriented x4, speech is clear and fluent. Moving all extremities well Scalp is flush to the skull. Drain will be clamped later today. Wound is clean, dry, no signs of infection.

## 2016-01-15 NOTE — Plan of Care (Signed)
Problem: Physical Regulation: Goal: Usual level of consciousness will be regained or maintained. Outcome: Completed/Met Date Met: 01/15/16 Patient is alert and oriented X4

## 2016-01-16 LAB — GLUCOSE, CAPILLARY
GLUCOSE-CAPILLARY: 104 mg/dL — AB (ref 65–99)
GLUCOSE-CAPILLARY: 145 mg/dL — AB (ref 65–99)
GLUCOSE-CAPILLARY: 126 mg/dL — AB (ref 65–99)
Glucose-Capillary: 129 mg/dL — ABNORMAL HIGH (ref 65–99)
Glucose-Capillary: 108 mg/dL — ABNORMAL HIGH (ref 65–99)

## 2016-01-16 NOTE — Progress Notes (Signed)
Patient ID: Geoffrey Neal, male   DOB: 1946/02/07, 70 y.o.   MRN: [id number] BP 119/86 (BP Location: Left Arm)   Pulse (!) 53   Temp 97.8 F (36.6 C) (Oral)   Resp 11   Ht 5\' 7"  (1.702 m)   Wt 94.7 kg (208 lb 12.4 oz)   SpO2 96%   BMI 32.70 kg/m  Alert and oriented x 4. Speech is clear and fluent Scalp is flat, no fluid collection. Wound is clean, dry, no signs of infection.  Possible discharge tomorrow.

## 2016-01-17 LAB — GLUCOSE, CAPILLARY
GLUCOSE-CAPILLARY: 115 mg/dL — AB (ref 65–99)
GLUCOSE-CAPILLARY: 120 mg/dL — AB (ref 65–99)
Glucose-Capillary: 138 mg/dL — ABNORMAL HIGH (ref 65–99)

## 2016-01-17 MED ORDER — LIDOCAINE HCL (PF) 1 % IJ SOLN
5.0000 mL | Freq: Once | INTRAMUSCULAR | Status: AC
  Administered 2016-01-17: 3 mL via INTRADERMAL
  Filled 2016-01-17: qty 5

## 2016-01-17 NOTE — Progress Notes (Signed)
PtVSS. Dc instructions given and discussed with pt via MD, RN and translated by son and spanish translation dc papers. Pt will dc home via wheelchair with son.

## 2016-01-17 NOTE — Discharge Instructions (Signed)
Call if fluid builds up underneath your scalp.  Call if the Temperature is higher 101.5 Call if the wound drains

## 2016-01-17 NOTE — Care Management Important Message (Signed)
Important Message  Patient Details  Name: Geoffrey Neal MRN: [id number] Date of Birth: May 09, 1945   Medicare Important Message Given:  Other (see comment)    [place], Durwin Davisson Abena 01/17/2016, 11:12 AM

## 2016-01-18 NOTE — Discharge Summary (Signed)
Physician Discharge Summary  Patient ID: Geoffrey Neal MRN: [id number] DOB/AGE: 07-21-1945 70 y.o.  Admit date: 01/12/2016 Discharge date: 01/18/2016  Admission Diagnoses:Postoperative CSF leak  Discharge Diagnoses:  Active Problems:   Postoperative CSF leak   Discharged Condition: good  Hospital Course: Geoffrey Neal was admitted and taken to the operating room for a craniotomy and csf leak repair. I placed a lumbar drain after the craniotomy and he was cared for in the NICU. I drained him for 3 days, clamped the drain, and discharged him 24 hours later. His scalp was flat, no fluid was appreciated. The wound was clean, dry, and without signs of infection.   Treatments: surgery: Craniotomy for csf leak repair, lumbar drain placement.   Discharge Exam: Blood pressure 126/83, pulse 88, temperature 98.4 F (36.9 C), temperature source Oral, resp. rate 17, height 5\' 7"  (1.702 m), weight 94.7 kg (208 lb 12.4 oz), SpO2 96 %. General appearance: alert, cooperative, appears stated age and no distress Neurologic: Alert and oriented X 3, normal strength and tone. Normal symmetric reflexes. Normal coordination and gait  Disposition: 01-Home or Self Care CEREBRO SPINAL FLUID LEAK    Medication List    TAKE these medications   acetaminophen 325 MG tablet Commonly known as:  TYLENOL Take 1-2 tablets (325-650 mg total) by mouth every 4 (four) hours as needed for mild pain.   CENTRUM SILVER PO Take 1 tablet by mouth daily.   gabapentin 300 MG capsule Commonly known as:  NEURONTIN Take 300 mg by mouth every morning.   levETIRAcetam 500 MG tablet Commonly known as:  KEPPRA Take 1 tablet (500 mg total) by mouth 2 (two) times daily.   levothyroxine 175 MCG tablet Commonly known as:  SYNTHROID, LEVOTHROID Take 1 tablet (175 mcg total) by mouth daily before breakfast.   metFORMIN 500 MG tablet Commonly known as:  GLUCOPHAGE Take 500 mg by mouth 2 (two) times daily with a meal.   OVER THE  COUNTER MEDICATION Take 1 tablet by mouth daily. Supplement for Metabolism   topiramate 25 MG tablet Commonly known as:  TOPAMAX Take 1 tablet (25 mg total) by mouth at bedtime.      Follow-up Information    Jacklin Zwick L, MD Follow up in 1 week(s).   Specialty:  Neurosurgery Why:  call the office to have staples and suture removed Contact information: [address] [place] [address] [phone]           Signed: Winfield Cunas 01/18/2016, 7:16 PM

## 2016-01-19 ENCOUNTER — Encounter (HOSPITAL_COMMUNITY): Payer: Self-pay | Admitting: *Deleted

## 2016-01-19 ENCOUNTER — Emergency Department (HOSPITAL_COMMUNITY): Payer: Medicare Other

## 2016-01-19 ENCOUNTER — Emergency Department (HOSPITAL_COMMUNITY)
Admission: EM | Admit: 2016-01-19 | Discharge: 2016-01-19 | Disposition: A | Discharge status: Home / Self Care | Payer: Medicare Other | Attending: Emergency Medicine | Admitting: Emergency Medicine

## 2016-01-19 DIAGNOSIS — W19XXXA Unspecified fall, initial encounter: Secondary | ICD-10-CM | POA: Insufficient documentation

## 2016-01-19 DIAGNOSIS — Y999 Unspecified external cause status: Secondary | ICD-10-CM | POA: Diagnosis not present

## 2016-01-19 DIAGNOSIS — S20411A Abrasion of right back wall of thorax, initial encounter: Secondary | ICD-10-CM | POA: Diagnosis not present

## 2016-01-19 DIAGNOSIS — Z87891 Personal history of nicotine dependence: Secondary | ICD-10-CM | POA: Insufficient documentation

## 2016-01-19 DIAGNOSIS — Z7984 Long term (current) use of oral hypoglycemic drugs: Secondary | ICD-10-CM | POA: Diagnosis not present

## 2016-01-19 DIAGNOSIS — Y939 Activity, unspecified: Secondary | ICD-10-CM | POA: Diagnosis not present

## 2016-01-19 DIAGNOSIS — G40909 Epilepsy, unspecified, not intractable, without status epilepticus: Secondary | ICD-10-CM | POA: Insufficient documentation

## 2016-01-19 DIAGNOSIS — E119 Type 2 diabetes mellitus without complications: Secondary | ICD-10-CM | POA: Diagnosis not present

## 2016-01-19 DIAGNOSIS — E039 Hypothyroidism, unspecified: Secondary | ICD-10-CM | POA: Diagnosis not present

## 2016-01-19 DIAGNOSIS — Y929 Unspecified place or not applicable: Secondary | ICD-10-CM | POA: Diagnosis not present

## 2016-01-19 DIAGNOSIS — R569 Unspecified convulsions: Secondary | ICD-10-CM

## 2016-01-19 DIAGNOSIS — Z79899 Other long term (current) drug therapy: Secondary | ICD-10-CM | POA: Diagnosis not present

## 2016-01-19 DIAGNOSIS — Z9889 Other specified postprocedural states: Secondary | ICD-10-CM | POA: Insufficient documentation

## 2016-01-19 DIAGNOSIS — S3992XA Unspecified injury of lower back, initial encounter: Secondary | ICD-10-CM | POA: Diagnosis present

## 2016-01-19 LAB — CBC WITH DIFFERENTIAL/PLATELET
Basophils Absolute: 0 10*3/uL (ref 0.0–0.1)
Basophils Relative: 0 %
Eosinophils Absolute: 0.1 10*3/uL (ref 0.0–0.7)
Eosinophils Relative: 2 %
HEMATOCRIT: 40.8 % (ref 39.0–52.0)
HEMOGLOBIN: 12.6 g/dL — AB (ref 13.0–17.0)
LYMPHS ABS: 1.5 10*3/uL (ref 0.7–4.0)
LYMPHS PCT: 19 %
MCH: 24 pg — AB (ref 26.0–34.0)
MCHC: 30.9 g/dL (ref 30.0–36.0)
MCV: 77.9 fL — AB (ref 78.0–100.0)
MONO ABS: 0.6 10*3/uL (ref 0.1–1.0)
MONOS PCT: 7 %
NEUTROS ABS: 5.8 10*3/uL (ref 1.7–7.7)
NEUTROS PCT: 72 %
Platelets: 257 10*3/uL (ref 150–400)
RBC: 5.24 MIL/uL (ref 4.22–5.81)
RDW: 16.5 % — AB (ref 11.5–15.5)
WBC: 8 10*3/uL (ref 4.0–10.5)

## 2016-01-19 LAB — TROPONIN I: Troponin I: <0.03 ng/mL (ref <0.03)

## 2016-01-19 LAB — COMPREHENSIVE METABOLIC PANEL
ALBUMIN: 4.1 g/dL (ref 3.5–5.0)
ALK PHOS: 90 U/L (ref 38–126)
ALT: 47 U/L (ref 17–63)
ANION GAP: 14 (ref 5–15)
AST: 41 U/L (ref 15–41)
BILIRUBIN TOTAL: 0.6 mg/dL (ref 0.3–1.2)
BUN: 13 mg/dL (ref 6–20)
CALCIUM: 9.1 mg/dL (ref 8.9–10.3)
CO2: 22 mmol/L (ref 22–32)
CREATININE: 1.03 mg/dL (ref 0.61–1.24)
Chloride: 104 mmol/L (ref 101–111)
GLUCOSE: 158 mg/dL — AB (ref 65–99)
Potassium: 3.7 mmol/L (ref 3.5–5.1)
Sodium: 140 mmol/L (ref 135–145)
TOTAL PROTEIN: 7.1 g/dL (ref 6.5–8.1)

## 2016-01-19 LAB — PROTIME-INR
INR: 0.97
Prothrombin Time: 12.9 seconds (ref 11.4–15.2)

## 2016-01-19 MED ORDER — SODIUM CHLORIDE 0.9 % IV SOLN
1000.0000 mg | Freq: Once | INTRAVENOUS | Status: AC
  Administered 2016-01-19: 1000 mg via INTRAVENOUS
  Filled 2016-01-19: qty 10

## 2016-01-19 MED ORDER — LEVETIRACETAM 500 MG PO TABS: 500.0000 mg | ORAL_TABLET | Freq: Two times a day (BID) | ORAL | 1 refills | Status: DC

## 2016-01-19 NOTE — ED Notes (Signed)
Pt ambulated to restroom, pt tolerated well.  

## 2016-01-19 NOTE — ED Notes (Signed)
Neuro surgery at bedside.

## 2016-01-19 NOTE — ED Triage Notes (Signed)
Pt arrives from home via [facility]. Pt was taking a shower this AM when he had a tonic clonic seizure with an unknown duration. Pt was postictal upon EMS arrival, is a&o x4 upon arrival to ED. Pt had his second brain surgery on Wednesday and had a shunt placed in his lower back to help relieve fluid build up per family. Pt did sustain a tongue injury during his seizure this AM and has c/o thoracic pain.

## 2016-01-19 NOTE — ED Notes (Signed)
Pt ambulated to restroom. Pt tolerated well.  

## 2016-01-19 NOTE — Discharge Instructions (Signed)
Take the keppra as prescribed. Followup with Dr. Christella Noa. Return to the ED if you develop new or worsening symptoms.

## 2016-01-19 NOTE — ED Provider Notes (Signed)
[place] DEPT Provider Note   CSN: [id number] Arrival date & time: 01/19/16  [id number]     History   Chief Complaint Chief Complaint  Patient presents with  . Seizures    HPI Geoffrey Neal is a 70 y.o. male.  Patient from home after seizure activity while in the shower. Witnessed by his wife. Family reports 4 minutes of tonic-clonic activity with tongue biting. Patient postictal but now back to baseline. History of seizure disorder as well as recent brain surgery by Dr. Cyndy Freeze. Was discharged from the hospital 2 days ago after craniotomy with CSF leak repair. Patient complains of upper back and low back pain from hitting his back in the shower. Denies chest pain or shortness of breath. Denies fever, denies vomiting. Family states he take gabapentin for his seizure. Appears he has not been taking the Keppra since he was discharged from the hospital. Reports last seizure was proximally 6 months ago. Patient moving all extremities. No focal weakness, numbness or tingling. No ladder incontinence.   The history is provided by the patient and a relative. The history is limited by a language barrier.  Seizures   Pertinent negatives include no visual disturbance, no chest pain, no cough, no nausea and no vomiting.    Past Medical History:  Diagnosis Date  . Blurred vision    "fluid in head"-   . Complication of anesthesia    " had difficulty waking up."  . Diabetes mellitus without complication ([place])    ?- patient states prediabetes  . DJD (degenerative joint disease)   . Elevated PSA   . Frequency of urination   . GERD (gastroesophageal reflux disease)    occ  . History of kidney stones    yrs ago- patient denies- "it was an Infection".  . Hypothyroidism   . Mixed hyperlipidemia    MILD PER PT  . Nocturia   . Seizures ([place])    x 2 2016- "none since taking Gabapentin"    Patient Active Problem List   Diagnosis Date Noted  . 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 Paraparesis  ([place]) 05/30/2014  . Meningioma ([place])   . Brain tumor ([place]) 05/07/2014  . CONSTIPATION 11/16/2009  . 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 HYPERGLYCEMIA 09/29/2008  . 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 GERD 02/18/2007  . VITILIGO 02/11/2007  . HYPOTHYROIDISM 02/04/2007  . DEGENERATIVE JOINT DISEASE 02/04/2007  . ARTHRALGIA 02/04/2007    Past Surgical History:  Procedure Laterality Date  . COLONOSCOPY W/ POLYPECTOMY     x 2  . CRANIOPLASTY N/A 05/22/2014   Procedure: CRANIOPLASTY;  Surgeon: Ashok Pall, MD;  Location: [place] [place];  Service: Neurosurgery;  Laterality: N/A;  Cranioplasty  . CRANIOTOMY Bilateral 05/08/2014   Procedure: BILATERAL FRONTAL PARIETAL CRANIOTOMY TUMOR EXCISION;  Surgeon: Ashok Pall, MD;  Location: [place] [facility];  Service: Neurosurgery;  Laterality: Bilateral;  . CRANIOTOMY N/A 01/12/2016   Procedure: CRANIOTOMY FOR  CEREBRO SPINAL FLUID LEAK;  Surgeon: Ashok Pall, MD;  Location: [place];  Service: Neurosurgery;  Laterality: N/A;  . HEMATOMA EVACUATION N/A 12/31/2015   Procedure: ASPIRATION OF SUBCUTANEOUS FLUID COLLECTION of Cranial Defect;  Surgeon: Ashok Pall, MD;  Location: [place] [facility];  Service: Neurosurgery;  Laterality: N/A;  . PLACEMENT OF LUMBAR DRAIN  01/12/2016   Procedure: PLACEMENT OF LUMBAR DRAIN;  Surgeon: Ashok Pall, MD;  Location: [place] [facility];  Service: Neurosurgery;;  . PROSTATE BIOPSY N/A 07/19/2012   Procedure: EUA, PROSTATE BIOPSY TRANSRECTAL ULTRASONIC PROSTATE (TUBP);  Surgeon:  Fredricka Bonine, MD;  Location: [facility];  Service: Urology;  Laterality: N/A;  . RADIOLOGY WITH ANESTHESIA N/A 05/07/2014   Procedure: RADIOLOGY WITH ANESTHESIA/EMBOLIZATION;  Surgeon: Rob Hickman, MD;  Location: [facility];  Service: Radiology;  Laterality: N/A;  . TONSILLECTOMY         Home Medications    Prior to Admission medications   Medication Sig Start Date End Date Taking? Authorizing Provider  acetaminophen (TYLENOL) 325 MG tablet  Take 1-2 tablets (325-650 mg total) by mouth every 4 (four) hours as needed for mild pain. Patient not taking: Reported on 01/12/2016 06/05/14   Lavon Paganini Angiulli, PA-C  gabapentin (NEURONTIN) 300 MG capsule Take 300 mg by mouth every morning.     Historical Provider, MD  levETIRAcetam (KEPPRA) 500 MG tablet Take 1 tablet (500 mg total) by mouth 2 (two) times daily. Patient not taking: Reported on 01/12/2016 06/05/14   Lavon Paganini Angiulli, PA-C  levothyroxine (SYNTHROID, LEVOTHROID) 175 MCG tablet Take 1 tablet (175 mcg total) by mouth daily before breakfast. 06/05/14   Lavon Paganini Angiulli, PA-C  metFORMIN (GLUCOPHAGE) 500 MG tablet Take 500 mg by mouth 2 (two) times daily with a meal.    Historical Provider, MD  Multiple Vitamins-Minerals (CENTRUM SILVER PO) Take 1 tablet by mouth daily.    Historical Provider, MD  OVER THE COUNTER MEDICATION Take 1 tablet by mouth daily. Supplement for Metabolism    Historical Provider, MD  topiramate (TOPAMAX) 25 MG tablet Take 1 tablet (25 mg total) by mouth at bedtime. Patient not taking: Reported on 01/12/2016 06/05/14   Lavon Paganini [place], PA-C    Family History History reviewed. No pertinent family history.  Social History Social History  Substance Use Topics  . Smoking status: Former Smoker    Years: 15.00    Types: Cigarettes    Quit date: 05/05/1981  . Smokeless tobacco: Never Used     Comment: QUIT SMOKING 34 YRS AGO--  APPROX. 1980'S  . Alcohol use No     Comment: quit 34 yrs ago     Allergies   Lemon flavor and Orange fruit [citrus]   Review of Systems Review of Systems  Constitutional: Negative for activity change, appetite change and fever.  HENT: Negative for congestion and rhinorrhea.   Eyes: Negative for visual disturbance.  Respiratory: Negative for cough, chest tightness, shortness of breath and wheezing.   Cardiovascular: Negative for chest pain and leg swelling.  Gastrointestinal: Negative for abdominal pain, nausea and vomiting.    Genitourinary: Negative for decreased urine volume, dysuria and hematuria.  Musculoskeletal: Positive for arthralgias, back pain and myalgias. Negative for neck pain and neck stiffness.  Neurological: Positive for seizures. Negative for dizziness, weakness and light-headedness.  A complete 10 system review of systems was obtained and all systems are negative except as noted in the HPI and PMH.     Physical Exam Updated Vital Signs BP 139/76   Pulse 86   Temp 98.9 F (37.2 C) (Oral)   Resp 12   Ht 5\' 7"  (1.702 m)   Wt 214 lb (97.1 kg)   SpO2 97%   BMI 33.52 kg/m   Physical Exam  Constitutional: He is oriented to person, place, and time. He appears well-developed and well-nourished. No distress.  HENT:  Head: Normocephalic and atraumatic.  Mouth/Throat: Oropharynx is clear and moist. No oropharyngeal exudate.  Craniotomy incision intact without bleeding or drainage Some edema to frontal scalp  Eyes: Conjunctivae and EOM are  normal. Pupils are equal, round, and reactive to light.  Neck: Normal range of motion. Neck supple.  No C spine pain  Cardiovascular: Normal rate, regular rhythm, normal heart sounds and intact distal pulses.   No murmur heard. Pulmonary/Chest: Effort normal and breath sounds normal. No respiratory distress.  Abdominal: Soft. There is no tenderness. There is no rebound and no guarding.  Musculoskeletal: Normal range of motion. He exhibits no edema or tenderness.  Abrasion to right upper back. No midline thoracic or lumbar tenderness  Neurological: He is alert and oriented to person, place, and time. No cranial nerve deficit. He exhibits normal muscle tone. Coordination normal.  5/5 strength throughout, moving all extremities. CN 2-12 intact  Skin: Skin is warm.  Psychiatric: He has a normal mood and affect. His behavior is normal.  Nursing note and vitals reviewed.    ED Treatments / Results  Labs (all labs ordered are listed, but only abnormal  results are displayed) Labs Reviewed  CBC WITH DIFFERENTIAL/PLATELET - Abnormal; Notable for the following:       Result Value   Hemoglobin 12.6 (*)    MCV 77.9 (*)    MCH 24.0 (*)    RDW 16.5 (*)    All other components within normal limits  COMPREHENSIVE METABOLIC PANEL - Abnormal; Notable for the following:    Glucose, Bld 158 (*)    All other components within normal limits  PROTIME-INR  TROPONIN I    EKG  EKG Interpretation  Date/Time:  Wednesday January 19 2016 10:12:31 EDT Ventricular Rate:  85 PR Interval:    QRS Duration: 101 QT Interval:  352 QTC Calculation: 419 R Axis:   12 Text Interpretation:  Sinus rhythm Atrial premature complex Borderline repolarization abnormality Nonspecific ST abnormality No significant change was found Confirmed by Wyvonnia Dusky  MD, Ananias Kolander [phone]) on 01/19/2016 10:25:53 AM       Radiology Dg Chest 2 View  Result Date: 01/19/2016 CLINICAL DATA:  Seizure, fall, status post brain surgery EXAM: CHEST  2 VIEW COMPARISON:  08/17/2013 FINDINGS: Lungs are clear.  No pleural effusion or pneumothorax. The heart is normal size. Visualized osseous structures are within normal limits. IMPRESSION: No evidence of acute cardiopulmonary disease. Electronically Signed   By: Julian Hy M.D.   On: 01/19/2016 11:55   Dg Thoracic Spine 2 View  Result Date: 01/19/2016 CLINICAL DATA:  Seizure, fall, back pain, status post brain surgery EXAM: THORACIC SPINE 2 VIEWS COMPARISON:  None. FINDINGS: Normal thoracic kyphosis. No evidence of fracture or dislocation. Vertebral body heights and intervertebral disc spaces are maintained. Mild degenerative changes the mid thoracic spine. Visualized lungs are clear. IMPRESSION: No fracture or dislocation is seen. Electronically Signed   By: Julian Hy M.D.   On: 01/19/2016 11:57   Dg Lumbar Spine Complete  Result Date: 01/19/2016 CLINICAL DATA:  Seizure, fall, status post brain surgery EXAM: LUMBAR SPINE -  COMPLETE 4+ VIEW COMPARISON:  None. FINDINGS: Five lumbar-type vertebral bodies. Normal lumbar lordosis. No evidence of fracture or dislocation. Vertebral body heights are maintained. Mild multilevel degenerative changes. Visualized bony pelvis appears intact. Vascular calcifications. IMPRESSION: No fracture or dislocation is seen. Mild degenerative changes. Electronically Signed   By: Julian Hy M.D.   On: 01/19/2016 11:56   Ct Head Wo Contrast  Result Date: 01/19/2016 CLINICAL DATA:  Spinal leak status post resection of meningioma. Shunt in place. Seizure after surgery. EXAM: CT HEAD WITHOUT CONTRAST CT CERVICAL SPINE WITHOUT CONTRAST TECHNIQUE: Multidetector CT imaging of  the head and cervical spine was performed following the standard protocol without intravenous contrast. Multiplanar CT image reconstructions of the cervical spine were also generated. COMPARISON:  11/26/2015 CT, 11/29/2015 MRI FINDINGS: CT HEAD FINDINGS Brain: Chronic encephalomalacia involving the high right frontal lobe. No evidence of acute large vessel territory infarction, intracranial hemorrhage or midline shift. Stable cerebral atrophy. Stable scattered areas of dural calcification. The spleen Vascular: Bilateral carotid siphon calcifications. Skull: Bifrontal cranioplasty with decrease of subgaleal fluid, thickness 9 mm versus 21 mm in thickness previously. Air-fluid levels are noted within the subgaleal fluid collection overlying the cranioplasty. Deep to the cranioplasty is a small air-fluid level involving 4 mm thick extra-axial fluid. This is relatively stable apart from small air-fluid level. Sinuses/Orbits: Orbits and visualized paranasal sinuses demonstrate no acute abnormalities . Other: None CT CERVICAL SPINE FINDINGS Alignment: Normal Skull base and vertebrae: The craniocervical relationship is maintained. Atlantodental interval is intact. Maintained cervical vertebral body heights. Right right C3-4 posterior ribs,  left C5-6 and bilateral C6-7 uncovertebral hypertrophy, sclerosis and degenerative subcortical cystic lucencies are noted. Soft tissues and spinal canal: No significant prevertebral soft tissue swelling. No significant spinal canal stenosis. Disc levels: C2-3 is small central disc protrusion. No significant canal stenosis or neural foraminal encroachment. C3-4 slight disc space narrowing small central disc bulge. No canal stenosis. Minimal right neural foraminal stenosis uncovertebral spur. C4-5 no focal disc herniation. No significant canal or neural foraminal stenosis. C5-6 broad-based posterior disc bulge. No significant canal stenosis. There is minimal left foraminal encroachment. C6-7 small disc-osteophyte complex with please and minimal right neural foraminal encroachment from uncovertebral spurring. C7-T1 no focal disc herniation or significant canal stenosis. Bilateral mild facet hypertrophy contributing with slight left neuroforaminal narrowing. T1-T2 unremarkable Upper chest:  No acute abnormality Other: No cervical spine fracture or malalignment. IMPRESSION: New postop change with air-fluid levels superficial and deep to the bifrontal cranioplasty status post dural repair. Interval decrease in thickness of fluid seen as above described. No acute osseous abnormality of the cervical spine. Electronically Signed   By: Ashley Royalty M.D.   On: 01/19/2016 12:14   Ct Cervical Spine Wo Contrast  Result Date: 01/19/2016 CLINICAL DATA:  Spinal leak status post resection of meningioma. Shunt in place. Seizure after surgery. EXAM: CT HEAD WITHOUT CONTRAST CT CERVICAL SPINE WITHOUT CONTRAST TECHNIQUE: Multidetector CT imaging of the head and cervical spine was performed following the standard protocol without intravenous contrast. Multiplanar CT image reconstructions of the cervical spine were also generated. COMPARISON:  11/26/2015 CT, 11/29/2015 MRI FINDINGS: CT HEAD FINDINGS Brain: Chronic encephalomalacia  involving the high right frontal lobe. No evidence of acute large vessel territory infarction, intracranial hemorrhage or midline shift. Stable cerebral atrophy. Stable scattered areas of dural calcification. The spleen Vascular: Bilateral carotid siphon calcifications. Skull: Bifrontal cranioplasty with decrease of subgaleal fluid, thickness 9 mm versus 21 mm in thickness previously. Air-fluid levels are noted within the subgaleal fluid collection overlying the cranioplasty. Deep to the cranioplasty is a small air-fluid level involving 4 mm thick extra-axial fluid. This is relatively stable apart from small air-fluid level. Sinuses/Orbits: Orbits and visualized paranasal sinuses demonstrate no acute abnormalities . Other: None CT CERVICAL SPINE FINDINGS Alignment: Normal Skull base and vertebrae: The craniocervical relationship is maintained. Atlantodental interval is intact. Maintained cervical vertebral body heights. Right right C3-4 posterior ribs, left C5-6 and bilateral C6-7 uncovertebral hypertrophy, sclerosis and degenerative subcortical cystic lucencies are noted. Soft tissues and spinal canal: No significant prevertebral soft tissue swelling. No significant  spinal canal stenosis. Disc levels: C2-3 is small central disc protrusion. No significant canal stenosis or neural foraminal encroachment. C3-4 slight disc space narrowing small central disc bulge. No canal stenosis. Minimal right neural foraminal stenosis uncovertebral spur. C4-5 no focal disc herniation. No significant canal or neural foraminal stenosis. C5-6 broad-based posterior disc bulge. No significant canal stenosis. There is minimal left foraminal encroachment. C6-7 small disc-osteophyte complex with please and minimal right neural foraminal encroachment from uncovertebral spurring. C7-T1 no focal disc herniation or significant canal stenosis. Bilateral mild facet hypertrophy contributing with slight left neuroforaminal narrowing. T1-T2  unremarkable Upper chest:  No acute abnormality Other: No cervical spine fracture or malalignment. IMPRESSION: New postop change with air-fluid levels superficial and deep to the bifrontal cranioplasty status post dural repair. Interval decrease in thickness of fluid seen as above described. No acute osseous abnormality of the cervical spine. Electronically Signed   By: Ashley Royalty M.D.   On: 01/19/2016 12:14    Procedures Procedures (including critical care time)  Medications Ordered in ED Medications  levETIRAcetam (KEPPRA) 1,000 mg in sodium chloride 0.9 % 100 mL IVPB (not administered)     Initial Impression / Assessment and Plan / ED Course  I have reviewed the triage vital signs and the nursing notes.  Pertinent labs & imaging results that were available during my care of the patient were reviewed by me and considered in my medical decision making (see chart for details).  Clinical Course  Seizure activity with history of seizure disorder and recent brain surgery. Has not been taking his Keppra it appears.  Patient back to baseline per family members. Imaging negative for acute traumatic injury. CT head looks improved from previous. IV keppra dose given.  Dr. Christella Noa is not available. CT reviewed with Dr. Kathyrn Sheriff.He feels that this is stable postoperative change and patient should be restarted on keppra and followup with Dr. Christella Noa next week.  Patient is tolerating PO and ambulatory.  Dr. Christella Noa has come from OR and saw patient.  He feels he can be discharged.  Patient does have fluid collection in scalp consistent with CSF, but Dr. Christella Noa is planning a CSF shunt next week.  Keppra Rx given.  Stressed compliance. Followup with Dr. Christella Noa. Return precautions discussed. Final Clinical Impressions(s) / ED Diagnoses   Final diagnoses:  Seizure [facility])    New Prescriptions Discharge Medication List as of 01/19/2016  5:11 PM       Ezequiel Essex, MD 01/19/16 2036

## 2016-01-19 NOTE — ED Notes (Signed)
Pt is in stable condition upon d/c and is escorted from ED via wheelchair. 

## 2016-01-19 NOTE — ED Notes (Signed)
Pt given a drink for fluid challenge.

## 2016-09-29 ENCOUNTER — Emergency Department (HOSPITAL_COMMUNITY)
Admission: EM | Admit: 2016-09-29 | Discharge: 2016-09-29 | Disposition: A | Discharge status: Home / Self Care | Payer: Medicare Other | Attending: Emergency Medicine | Admitting: Emergency Medicine

## 2016-09-29 ENCOUNTER — Emergency Department (HOSPITAL_COMMUNITY): Payer: Medicare Other

## 2016-09-29 ENCOUNTER — Encounter (HOSPITAL_COMMUNITY): Payer: Self-pay | Admitting: *Deleted

## 2016-09-29 DIAGNOSIS — E119 Type 2 diabetes mellitus without complications: Secondary | ICD-10-CM | POA: Insufficient documentation

## 2016-09-29 DIAGNOSIS — Z79899 Other long term (current) drug therapy: Secondary | ICD-10-CM | POA: Insufficient documentation

## 2016-09-29 DIAGNOSIS — Z87891 Personal history of nicotine dependence: Secondary | ICD-10-CM | POA: Insufficient documentation

## 2016-09-29 DIAGNOSIS — K859 Acute pancreatitis without necrosis or infection, unspecified: Secondary | ICD-10-CM | POA: Diagnosis not present

## 2016-09-29 DIAGNOSIS — R1010 Upper abdominal pain, unspecified: Secondary | ICD-10-CM | POA: Diagnosis present

## 2016-09-29 DIAGNOSIS — E039 Hypothyroidism, unspecified: Secondary | ICD-10-CM | POA: Diagnosis not present

## 2016-09-29 LAB — CBC
HCT: 47.5 % (ref 39.0–52.0)
Hemoglobin: 16.2 g/dL (ref 13.0–17.0)
MCH: 29.5 pg (ref 26.0–34.0)
MCHC: 34.1 g/dL (ref 30.0–36.0)
MCV: 86.5 fL (ref 78.0–100.0)
PLATELETS: 206 10*3/uL (ref 150–400)
RBC: 5.49 MIL/uL (ref 4.22–5.81)
RDW: 15.3 % (ref 11.5–15.5)
WBC: 11.6 10*3/uL — ABNORMAL HIGH (ref 4.0–10.5)

## 2016-09-29 LAB — COMPREHENSIVE METABOLIC PANEL
ALK PHOS: 92 U/L (ref 38–126)
ALT: 38 U/L (ref 17–63)
AST: 32 U/L (ref 15–41)
Albumin: 4.3 g/dL (ref 3.5–5.0)
Anion gap: 9 (ref 5–15)
BILIRUBIN TOTAL: 0.9 mg/dL (ref 0.3–1.2)
BUN: 14 mg/dL (ref 6–20)
CALCIUM: 9.3 mg/dL (ref 8.9–10.3)
CO2: 27 mmol/L (ref 22–32)
CREATININE: 1.02 mg/dL (ref 0.61–1.24)
Chloride: 103 mmol/L (ref 101–111)
GFR calc non Af Amer: >60 mL/min (ref >60)
GLUCOSE: 164 mg/dL — AB (ref 65–99)
Potassium: 4.5 mmol/L (ref 3.5–5.1)
SODIUM: 139 mmol/L (ref 135–145)
Total Protein: 7.8 g/dL (ref 6.5–8.1)

## 2016-09-29 LAB — LIPASE, BLOOD: Lipase: 2165 U/L — ABNORMAL HIGH (ref 11–51)

## 2016-09-29 MED ORDER — SODIUM CHLORIDE 0.9 % IV BOLUS (SEPSIS)
1000.0000 mL | Freq: Once | INTRAVENOUS | Status: AC
  Administered 2016-09-29: 1000 mL via INTRAVENOUS

## 2016-09-29 MED ORDER — IOPAMIDOL (ISOVUE-300) INJECTION 61%
INTRAVENOUS | Status: AC
  Administered 2016-09-29: 100 mL
  Filled 2016-09-29: qty 100

## 2016-09-29 MED ORDER — OXYCODONE-ACETAMINOPHEN 5-325 MG PO TABS: 1.0000 | ORAL_TABLET | ORAL | 0 refills | Status: AC | PRN

## 2016-09-29 MED ORDER — MORPHINE SULFATE (PF) 4 MG/ML IV SOLN
4.0000 mg | Freq: Once | INTRAVENOUS | Status: AC
  Administered 2016-09-29: 4 mg via INTRAVENOUS
  Filled 2016-09-29: qty 1

## 2016-09-29 MED ORDER — ONDANSETRON HCL 4 MG/2ML IJ SOLN
4.0000 mg | Freq: Once | INTRAMUSCULAR | Status: AC
  Administered 2016-09-29: 4 mg via INTRAVENOUS
  Filled 2016-09-29: qty 2

## 2016-09-29 MED ORDER — ONDANSETRON HCL 4 MG PO TABS: 4.0000 mg | ORAL_TABLET | Freq: Three times a day (TID) | ORAL | 0 refills | Status: AC | PRN

## 2016-09-29 NOTE — Discharge Instructions (Signed)
Read the information below.  Use the prescribed medication as directed.  Please discuss all new medications with your pharmacist.  You may return to the Emergency Department at any time for worsening condition or any new symptoms that concern you.     If you develop high fevers, worsening abdominal pain, uncontrolled vomiting, or are unable to tolerate fluids by mouth, return to the ER for a recheck.    Lea la informacin a continuacin. Use la medicacin prescrita segn lo dirigido. Discuta todos los medicamentos nuevos con su farmacutico. Puede regresar al Departamento de Emergencia en cualquier momento por empeoramiento de la condicin o cualquier sntoma nuevo que le preocupe. Si desarrolla fiebre alta, empeoramiento del dolor abdominal, vmitos descontrolados o no puede tolerar los lquidos por la boca, regrese a la sala de emergencias para una nueva verificacin.

## 2016-09-29 NOTE — ED Provider Notes (Signed)
[facility] DEPT Provider Note   CSN: [id number] Arrival date & time: 09/29/16  1604     History   Chief Complaint Chief Complaint  Patient presents with  . Emesis  . Abdominal Pain    HPI Geoffrey Neal is a 71 y.o. male.  HPI   Pt with hx DM, GERD, elevated PSA p/w upper abdominal pain that woke him from sleep this morning.  He ate breakfast and immediately started vomiting x 3.  Has continued nausea.  Has not has a bowel movement in two days, reports he is not passing gas.  Denies fevers, chills, urinary symptoms. He has never had abdominal surgery.  Was seen by GI last month ([facility]) with normal endoscopy and colonoscopy, negative MRI (performed because pt has anemia).    Past Medical History:  Diagnosis Date  . Blurred vision    "fluid in head"-   . Complication of anesthesia    " had difficulty waking up."  . Diabetes mellitus without complication ([place])    ?- patient states prediabetes  . DJD (degenerative joint disease)   . Elevated PSA   . Frequency of urination   . GERD (gastroesophageal reflux disease)    occ  . History of kidney stones    yrs ago- patient denies- "it was an Infection".  . Hypothyroidism   . Mixed hyperlipidemia    MILD PER PT  . Nocturia   . Seizures ([place])    x 2 2016- "none since taking Gabapentin"    Patient Active Problem List   Diagnosis Date Noted  . 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 Paraparesis ([place]) 05/30/2014  . Meningioma ([place])   . Brain tumor ([place]) 05/07/2014  . CONSTIPATION 11/16/2009  . 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 HYPERGLYCEMIA 09/29/2008  . 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 GERD 02/18/2007  . VITILIGO 02/11/2007  . HYPOTHYROIDISM 02/04/2007  . DEGENERATIVE JOINT DISEASE 02/04/2007  . ARTHRALGIA 02/04/2007    Past Surgical History:  Procedure Laterality Date  . COLONOSCOPY W/ POLYPECTOMY     x 2  . CRANIOPLASTY N/A 05/22/2014   Procedure: CRANIOPLASTY;  Surgeon: Ashok Pall, MD;  Location: [facility];  Service:  Neurosurgery;  Laterality: N/A;  Cranioplasty  . CRANIOTOMY Bilateral 05/08/2014   Procedure: BILATERAL FRONTAL PARIETAL CRANIOTOMY TUMOR EXCISION;  Surgeon: Ashok Pall, MD;  Location: [facility];  Service: Neurosurgery;  Laterality: Bilateral;  . CRANIOTOMY N/A 01/12/2016   Procedure: CRANIOTOMY FOR  CEREBRO SPINAL FLUID LEAK;  Surgeon: Ashok Pall, MD;  Location: [facility];  Service: Neurosurgery;  Laterality: N/A;  . HEMATOMA EVACUATION N/A 12/31/2015   Procedure: ASPIRATION OF SUBCUTANEOUS FLUID COLLECTION of Cranial Defect;  Surgeon: Ashok Pall, MD;  Location: [facility];  Service: Neurosurgery;  Laterality: N/A;  . PLACEMENT OF LUMBAR DRAIN  01/12/2016   Procedure: PLACEMENT OF LUMBAR DRAIN;  Surgeon: Ashok Pall, MD;  Location: [facility];  Service: Neurosurgery;;  . PROSTATE BIOPSY N/A 07/19/2012   Procedure: EUA, PROSTATE BIOPSY TRANSRECTAL ULTRASONIC PROSTATE (TUBP);  Surgeon: Fredricka Bonine, MD;  Location: [facility];  Service: Urology;  Laterality: N/A;  . RADIOLOGY WITH ANESTHESIA N/A 05/07/2014   Procedure: RADIOLOGY WITH ANESTHESIA/EMBOLIZATION;  Surgeon: Rob Hickman, MD;  Location: [facility];  Service: Radiology;  Laterality: N/A;  . TONSILLECTOMY         Home Medications    Prior to Admission medications   Medication Sig Start Date End Date Taking? Authorizing Provider  ferrous sulfate 325 (65 FE)  MG tablet Take 325 mg by mouth daily. 07/17/16 07/17/17 Yes [provider]  levETIRAcetam (KEPPRA) 500 MG tablet Take 1 tablet (500 mg total) by mouth 2 (two) times daily. 01/19/16  Yes Rancour, Annie Main, MD  levothyroxine (SYNTHROID, LEVOTHROID) 175 MCG tablet Take 1 tablet (175 mcg total) by mouth daily before breakfast. 06/05/14  Yes Angiulli, Lavon Paganini, PA-C  metFORMIN (GLUCOPHAGE) 500 MG tablet Take 500 mg by mouth 2 (two) times daily with a meal.   Yes [provider]  mineral oil-hydrophilic petrolatum (AQUAPHOR) ointment  Apply 1 application topically as needed for rash. 07/28/16  Yes [provider]  acetaminophen (TYLENOL) 325 MG tablet Take 1-2 tablets (325-650 mg total) by mouth every 4 (four) hours as needed for mild pain. Patient not taking: Reported on 09/29/2016 06/05/14   Angiulli, Lavon Paganini, PA-C  ondansetron (ZOFRAN) 4 MG tablet Take 1 tablet (4 mg total) by mouth every 8 (eight) hours as needed for nausea or vomiting. 09/29/16   Clayton Bibles, PA-C  oxyCODONE-acetaminophen (PERCOCET/ROXICET) 5-325 MG tablet Take 1-2 tablets by mouth every 4 (four) hours as needed for severe pain. 09/29/16   Clayton Bibles, PA-C  topiramate (TOPAMAX) 25 MG tablet Take 1 tablet (25 mg total) by mouth at bedtime. Patient not taking: Reported on 01/19/2016 06/05/14   Prien, Lavon Paganini, PA-C    Family History No family history on file.  Social History Social History  Substance Use Topics  . Smoking status: Former Smoker    Years: 15.00    Types: Cigarettes    Quit date: 05/05/1981  . Smokeless tobacco: Never Used     Comment: QUIT SMOKING 34 YRS AGO--  APPROX. 1980'S  . Alcohol use No     Comment: quit 34 yrs ago     Allergies   Orange fruit [citrus]   Review of Systems Review of Systems  All other systems reviewed and are negative.    Physical Exam Updated Vital Signs BP (!) 147/85   Pulse 61   Temp 98.3 F (36.8 C) (Oral)   Resp 16   Ht 5\' 10"  (1.778 m)   Wt 97.1 kg (214 lb)   SpO2 91%   BMI 30.71 kg/m   Physical Exam  Constitutional: He appears well-developed and well-nourished. No distress.  HENT:  Head: Normocephalic and atraumatic.  Neck: Neck supple.  Cardiovascular: Normal rate and regular rhythm.   Pulmonary/Chest: Effort normal and breath sounds normal. No respiratory distress. He has no wheezes. He has no rales.  Abdominal: Soft. He exhibits no distension and no mass. There is tenderness in the right upper quadrant and right lower quadrant. There is no rebound and no guarding. A hernia  (easily reducible, soft umbilical hernia ) is present.  Neurological: He is alert. He exhibits normal muscle tone.  Skin: He is not diaphoretic.  Nursing note and vitals reviewed.    ED Treatments / Results  Labs (all labs ordered are listed, but only abnormal results are displayed) Labs Reviewed  LIPASE, BLOOD - Abnormal; Notable for the following:       Result Value   Lipase 2,165 (*)    All other components within normal limits  COMPREHENSIVE METABOLIC PANEL - Abnormal; Notable for the following:    Glucose, Bld 164 (*)    All other components within normal limits  CBC - Abnormal; Notable for the following:    WBC 11.6 (*)    All other components within normal limits    EKG  EKG  Interpretation None       Radiology Ct Abdomen Pelvis W Contrast  Result Date: 09/29/2016 CLINICAL DATA:  71 year old male with abdominal and pelvic pain, vomiting and diarrhea today. EXAM: CT ABDOMEN AND PELVIS WITH CONTRAST TECHNIQUE: Multidetector CT imaging of the abdomen and pelvis was performed using the standard protocol following bolus administration of intravenous contrast. CONTRAST:  170mL ISOVUE-300 IOPAMIDOL (ISOVUE-300) INJECTION 61% COMPARISON:  None. FINDINGS: Lower chest: No acute abnormality Hepatobiliary: Hepatic steatosis noted. No focal hepatic abnormalities are present. The gallbladder is unremarkable. There is no evidence of biliary dilatation. Pancreas: Moderate peripancreatic inflammation is compatible with pancreatitis. There is no evidence of pancreatic necrosis, acute collection, venous thrombosis or definite hemorrhage. Spleen: Unremarkable Adrenals/Urinary Tract: The kidneys, adrenal glands and bladder are unremarkable except for probable small renal cysts. Stomach/Bowel: Stomach is within normal limits. No evidence of bowel wall thickening, distention, or inflammatory changes. Vascular/Lymphatic: Aortic atherosclerosis noted without aneurysm. No enlarged abdominal or pelvic  lymph nodes. Reproductive: Prostate is unremarkable. Other: No ascites, abscess or pneumoperitoneum. A small umbilical hernia containing fat is present. Musculoskeletal: Mild multilevel degenerative disc disease within the lumbar spine noted. IMPRESSION: Pancreatitis. No evidence of pancreatic necrosis, acute collection or venous thrombosis. Hepatic steatosis. Aortic Atherosclerosis (ICD10-I70.0). Electronically Signed   By: Margarette Canada M.D.   On: 09/29/2016 18:34    Procedures Procedures (including critical care time)  Medications Ordered in ED Medications  morphine 4 MG/ML injection 4 mg (4 mg Intravenous Given 09/29/16 1800)  ondansetron (ZOFRAN) injection 4 mg (4 mg Intravenous Given 09/29/16 1800)  sodium chloride 0.9 % bolus 1,000 mL (0 mLs Intravenous Stopped 09/29/16 1916)  iopamidol (ISOVUE-300) 61 % injection (100 mLs  Contrast Given 09/29/16 1809)  morphine 4 MG/ML injection 4 mg (4 mg Intravenous Given 09/29/16 1917)  ondansetron (ZOFRAN) injection 4 mg (4 mg Intravenous Given 09/29/16 1917)     Initial Impression / Assessment and Plan / ED Course  I have reviewed the triage vital signs and the nursing notes.  Pertinent labs & imaging results that were available during my care of the patient were reviewed by me and considered in my medical decision making (see chart for details).  Clinical Course as of Sep 30 2014  Fri Sep 29, 2016  1912 Discussed results through video interpreter.  Pt declines admission, states he has things he has to do at home and things his wife has to help him with.  He does not want to stay in the hospital, thinks he can manage as an outpatient and will follow all instructions to the letter.   [EW]  1951 Pt drank a cup of water and is feeling fine.  No vomiting.  Continues to desire discharge home.  Discussed strict return precautions.    [EW]    Clinical Course User Index [EW] [place], Akia Montalban, [place]    Afebrile nontoxic patient with upper abdominal pain and vomiting  that began this morning.  Has remote hx alcoholism.  Gallbladder unremarkable.  No stones or obstruction.  Pt found to have pancreatitis on CT scan, lipase > 2,000.  Pt does not want to be admitted to the hospital.  BISAP score is 1 due to patient's age.  PO trial with great success.  Discussed pt and plan with Dr Laverta Baltimore.  Pt d/c home with close PCP follow up, strict return precautions, clear liquid diet.  Discussed result, findings, treatment, and follow up  with patient.  Pt given return precautions.  Pt verbalizes understanding and agrees  with plan.      Final Clinical Impressions(s) / ED Diagnoses   Final diagnoses:  Acute pancreatitis without infection or necrosis, unspecified pancreatitis type    New Prescriptions New Prescriptions   ONDANSETRON (ZOFRAN) 4 MG TABLET    Take 1 tablet (4 mg total) by mouth every 8 (eight) hours as needed for nausea or vomiting.   OXYCODONE-ACETAMINOPHEN (PERCOCET/ROXICET) 5-325 MG TABLET    Take 1-2 tablets by mouth every 4 (four) hours as needed for severe pain.     Clayton Bibles, PA-C 09/29/16 2016    Margette Fast, MD 09/30/16 [phone]

## 2016-09-29 NOTE — ED Triage Notes (Signed)
Pt reports x3 emesis episodes onset today and generalized abd pain described as sharp, denies diarrhea, A&O x4

## 2017-03-01 ENCOUNTER — Observation Stay (HOSPITAL_COMMUNITY): Payer: Medicare Other

## 2017-03-01 ENCOUNTER — Observation Stay (HOSPITAL_BASED_OUTPATIENT_CLINIC_OR_DEPARTMENT_OTHER): Payer: Medicare Other

## 2017-03-01 ENCOUNTER — Encounter (HOSPITAL_COMMUNITY): Payer: Self-pay

## 2017-03-01 ENCOUNTER — Observation Stay (HOSPITAL_COMMUNITY)
Admission: EM | Admit: 2017-03-01 | Discharge: 2017-03-01 | Disposition: A | Discharge status: Home / Self Care | Payer: Medicare Other | Attending: Family Medicine | Admitting: Family Medicine

## 2017-03-01 ENCOUNTER — Emergency Department (HOSPITAL_COMMUNITY): Payer: Medicare Other

## 2017-03-01 DIAGNOSIS — Z794 Long term (current) use of insulin: Secondary | ICD-10-CM | POA: Diagnosis not present

## 2017-03-01 DIAGNOSIS — I110 Hypertensive heart disease with heart failure: Secondary | ICD-10-CM | POA: Diagnosis not present

## 2017-03-01 DIAGNOSIS — Z9889 Other specified postprocedural states: Secondary | ICD-10-CM

## 2017-03-01 DIAGNOSIS — I5030 Unspecified diastolic (congestive) heart failure: Secondary | ICD-10-CM | POA: Diagnosis not present

## 2017-03-01 DIAGNOSIS — Z7982 Long term (current) use of aspirin: Secondary | ICD-10-CM | POA: Diagnosis not present

## 2017-03-01 DIAGNOSIS — C240 Malignant neoplasm of extrahepatic bile duct: Secondary | ICD-10-CM | POA: Diagnosis not present

## 2017-03-01 DIAGNOSIS — R29810 Facial weakness: Secondary | ICD-10-CM | POA: Insufficient documentation

## 2017-03-01 DIAGNOSIS — Z86018 Personal history of other benign neoplasm: Secondary | ICD-10-CM

## 2017-03-01 DIAGNOSIS — E039 Hypothyroidism, unspecified: Secondary | ICD-10-CM | POA: Diagnosis not present

## 2017-03-01 DIAGNOSIS — Z79899 Other long term (current) drug therapy: Secondary | ICD-10-CM | POA: Insufficient documentation

## 2017-03-01 DIAGNOSIS — E119 Type 2 diabetes mellitus without complications: Secondary | ICD-10-CM | POA: Insufficient documentation

## 2017-03-01 DIAGNOSIS — G459 Transient cerebral ischemic attack, unspecified: Secondary | ICD-10-CM

## 2017-03-01 DIAGNOSIS — D320 Benign neoplasm of cerebral meninges: Secondary | ICD-10-CM | POA: Insufficient documentation

## 2017-03-01 DIAGNOSIS — Z87442 Personal history of urinary calculi: Secondary | ICD-10-CM | POA: Diagnosis not present

## 2017-03-01 DIAGNOSIS — Z8507 Personal history of malignant neoplasm of pancreas: Secondary | ICD-10-CM | POA: Insufficient documentation

## 2017-03-01 DIAGNOSIS — K219 Gastro-esophageal reflux disease without esophagitis: Secondary | ICD-10-CM | POA: Diagnosis not present

## 2017-03-01 DIAGNOSIS — Z8673 Personal history of transient ischemic attack (TIA), and cerebral infarction without residual deficits: Secondary | ICD-10-CM | POA: Insufficient documentation

## 2017-03-01 DIAGNOSIS — E782 Mixed hyperlipidemia: Secondary | ICD-10-CM | POA: Diagnosis not present

## 2017-03-01 DIAGNOSIS — Z87891 Personal history of nicotine dependence: Secondary | ICD-10-CM | POA: Diagnosis not present

## 2017-03-01 DIAGNOSIS — I509 Heart failure, unspecified: Secondary | ICD-10-CM | POA: Diagnosis not present

## 2017-03-01 DIAGNOSIS — Z87898 Personal history of other specified conditions: Secondary | ICD-10-CM

## 2017-03-01 LAB — COMPREHENSIVE METABOLIC PANEL
ALT: 27 U/L (ref 17–63)
AST: 31 U/L (ref 15–41)
Albumin: 3.7 g/dL (ref 3.5–5.0)
Alkaline Phosphatase: 135 U/L — ABNORMAL HIGH (ref 38–126)
Anion gap: 6 (ref 5–15)
BUN: 6 mg/dL (ref 6–20)
CHLORIDE: 105 mmol/L (ref 101–111)
CO2: 29 mmol/L (ref 22–32)
CREATININE: 0.89 mg/dL (ref 0.61–1.24)
Calcium: 8.8 mg/dL — ABNORMAL LOW (ref 8.9–10.3)
GFR calc Af Amer: >60 mL/min (ref >60)
GFR calc non Af Amer: >60 mL/min (ref >60)
Glucose, Bld: 115 mg/dL — ABNORMAL HIGH (ref 65–99)
Potassium: 4.1 mmol/L (ref 3.5–5.1)
SODIUM: 140 mmol/L (ref 135–145)
Total Bilirubin: 0.5 mg/dL (ref 0.3–1.2)
Total Protein: 6.9 g/dL (ref 6.5–8.1)

## 2017-03-01 LAB — CBC
HEMATOCRIT: 40.3 % (ref 39.0–52.0)
Hemoglobin: 13.3 g/dL (ref 13.0–17.0)
MCH: 30 pg (ref 26.0–34.0)
MCHC: 33 g/dL (ref 30.0–36.0)
MCV: 91 fL (ref 78.0–100.0)
PLATELETS: 210 10*3/uL (ref 150–400)
RBC: 4.43 MIL/uL (ref 4.22–5.81)
RDW: 13.1 % (ref 11.5–15.5)
WBC: 4.5 10*3/uL (ref 4.0–10.5)

## 2017-03-01 LAB — HEMOGLOBIN A1C
HEMOGLOBIN A1C: 5.6 % (ref 4.8–5.6)
Mean Plasma Glucose: 114.02 mg/dL

## 2017-03-01 LAB — URINALYSIS, ROUTINE W REFLEX MICROSCOPIC
Bilirubin Urine: NEGATIVE
GLUCOSE, UA: NEGATIVE mg/dL
Hgb urine dipstick: NEGATIVE
Ketones, ur: NEGATIVE mg/dL
Leukocytes, UA: NEGATIVE
Nitrite: NEGATIVE
PH: 7 (ref 5.0–8.0)
Protein, ur: NEGATIVE mg/dL
Specific Gravity, Urine: 1.008 (ref 1.005–1.030)

## 2017-03-01 LAB — ECHOCARDIOGRAM COMPLETE
Height: 67 in
Weight: 2587.2 oz

## 2017-03-01 LAB — DIFFERENTIAL
BASOS ABS: 0 10*3/uL (ref 0.0–0.1)
BASOS PCT: 0 %
Eosinophils Absolute: 0.1 10*3/uL (ref 0.0–0.7)
Eosinophils Relative: 3 %
LYMPHS PCT: 17 %
Lymphs Abs: 0.8 10*3/uL (ref 0.7–4.0)
MONOS PCT: 10 %
Monocytes Absolute: 0.5 10*3/uL (ref 0.1–1.0)
NEUTROS ABS: 3.1 10*3/uL (ref 1.7–7.7)
Neutrophils Relative %: 70 %

## 2017-03-01 LAB — LIPID PANEL
CHOLESTEROL: 150 mg/dL (ref 0–200)
HDL: 38 mg/dL — ABNORMAL LOW (ref >40)
LDL Cholesterol: 90 mg/dL (ref 0–99)
TRIGLYCERIDES: 111 mg/dL (ref <150)
Total CHOL/HDL Ratio: 3.9 RATIO
VLDL: 22 mg/dL (ref 0–40)

## 2017-03-01 LAB — GLUCOSE, CAPILLARY
GLUCOSE-CAPILLARY: 106 mg/dL — AB (ref 65–99)
GLUCOSE-CAPILLARY: 106 mg/dL — AB (ref 65–99)
Glucose-Capillary: 93 mg/dL (ref 65–99)

## 2017-03-01 LAB — I-STAT CHEM 8, ED
BUN: 8 mg/dL (ref 6–20)
CHLORIDE: 103 mmol/L (ref 101–111)
CREATININE: 0.8 mg/dL (ref 0.61–1.24)
Calcium, Ion: 1.14 mmol/L — ABNORMAL LOW (ref 1.15–1.40)
Glucose, Bld: 115 mg/dL — ABNORMAL HIGH (ref 65–99)
HEMATOCRIT: 40 % (ref 39.0–52.0)
Hemoglobin: 13.6 g/dL (ref 13.0–17.0)
POTASSIUM: 4 mmol/L (ref 3.5–5.1)
Sodium: 142 mmol/L (ref 135–145)
TCO2: 27 mmol/L (ref 22–32)

## 2017-03-01 LAB — I-STAT TROPONIN, ED: Troponin i, poc: 0 ng/mL (ref 0.00–0.08)

## 2017-03-01 LAB — APTT: APTT: 34 s (ref 24–36)

## 2017-03-01 LAB — CBG MONITORING, ED: Glucose-Capillary: 107 mg/dL — ABNORMAL HIGH (ref 65–99)

## 2017-03-01 LAB — PROTIME-INR
INR: 1.04
Prothrombin Time: 13.5 seconds (ref 11.4–15.2)

## 2017-03-01 MED ORDER — ATORVASTATIN CALCIUM 40 MG PO TABS: 40.0000 mg | ORAL_TABLET | Freq: Every day | ORAL | Status: DC

## 2017-03-01 MED ORDER — INSULIN ASPART 100 UNIT/ML ~~LOC~~ SOLN: 0.0000 [IU] | Freq: Three times a day (TID) | SUBCUTANEOUS | Status: DC

## 2017-03-01 MED ORDER — INSULIN ASPART 100 UNIT/ML ~~LOC~~ SOLN: 0.0000 [IU] | Freq: Every day | SUBCUTANEOUS | Status: DC

## 2017-03-01 MED ORDER — ENOXAPARIN SODIUM 40 MG/0.4ML ~~LOC~~ SOLN
40.0000 mg | SUBCUTANEOUS | Status: DC
  Administered 2017-03-01: 40 mg via SUBCUTANEOUS
  Filled 2017-03-01: qty 0.4

## 2017-03-01 MED ORDER — SODIUM CHLORIDE 0.9 % IV SOLN
INTRAVENOUS | Status: AC
  Administered 2017-03-01: 07:00:00 via INTRAVENOUS

## 2017-03-01 MED ORDER — ASPIRIN 325 MG PO TABS: 325.0000 mg | ORAL_TABLET | Freq: Every day | ORAL | Status: DC

## 2017-03-01 MED ORDER — ATORVASTATIN CALCIUM 20 MG PO TABS: 20.0000 mg | ORAL_TABLET | Freq: Every day | ORAL | Status: DC

## 2017-03-01 MED ORDER — ATORVASTATIN CALCIUM 20 MG PO TABS: 20.0000 mg | ORAL_TABLET | Freq: Every day | ORAL | 0 refills | Status: AC

## 2017-03-01 MED ORDER — ASPIRIN 325 MG PO TABS: 325.0000 mg | ORAL_TABLET | Freq: Every day | ORAL | 0 refills | Status: AC

## 2017-03-01 MED ORDER — LEVETIRACETAM 500 MG PO TABS
500.0000 mg | ORAL_TABLET | Freq: Two times a day (BID) | ORAL | Status: DC
  Filled 2017-03-01: qty 1

## 2017-03-01 MED ORDER — LEVOTHYROXINE SODIUM 175 MCG PO TABS
175.0000 ug | ORAL_TABLET | Freq: Every day | ORAL | Status: DC
  Administered 2017-03-01: 175 ug via ORAL
  Filled 2017-03-01 (×2): qty 1

## 2017-03-01 MED ORDER — ASPIRIN 81 MG PO CHEW
324.0000 mg | CHEWABLE_TABLET | Freq: Once | ORAL | Status: AC
  Administered 2017-03-01: 324 mg via ORAL
  Filled 2017-03-01: qty 4

## 2017-03-01 MED ORDER — STROKE: EARLY STAGES OF RECOVERY BOOK
Freq: Once | Status: DC
  Filled 2017-03-01 (×2): qty 1

## 2017-03-01 MED ORDER — ACETAMINOPHEN 160 MG/5ML PO SOLN: 650.0000 mg | ORAL | Status: DC | PRN

## 2017-03-01 MED ORDER — ACETAMINOPHEN 650 MG RE SUPP: 650.0000 mg | RECTAL | Status: DC | PRN

## 2017-03-01 MED ORDER — ASPIRIN 300 MG RE SUPP: 300.0000 mg | Freq: Every day | RECTAL | Status: DC

## 2017-03-01 MED ORDER — ACETAMINOPHEN 325 MG PO TABS: 650.0000 mg | ORAL_TABLET | ORAL | Status: DC | PRN

## 2017-03-01 MED ORDER — ATORVASTATIN CALCIUM 40 MG PO TABS: 40.0000 mg | ORAL_TABLET | Freq: Every day | ORAL | 0 refills | Status: DC

## 2017-03-01 MED ORDER — OXYCODONE-ACETAMINOPHEN 5-325 MG PO TABS: 1.0000 | ORAL_TABLET | ORAL | Status: DC | PRN

## 2017-03-01 MED ORDER — SENNOSIDES-DOCUSATE SODIUM 8.6-50 MG PO TABS: 1.0000 | ORAL_TABLET | Freq: Every evening | ORAL | Status: DC | PRN

## 2017-03-01 NOTE — ED Notes (Signed)
Patient denies current numbness or tingling.

## 2017-03-01 NOTE — Progress Notes (Signed)
  Echocardiogram 2D Echocardiogram has been performed.  Jennette Dubin 03/01/2017, 3:56 PM

## 2017-03-01 NOTE — Consult Note (Signed)
Referring Physician: Dr. Myna Hidalgo    Chief Complaint: TIA  HPI: Geoffrey Neal is an 71 y.o. male with a history of pancreatic cancer s/p surgery currently on chemotherapy and radiation therapy, prior right frontal meningioma resection and prior seizures who presented to the ED early this morning after waking up at [place] with left sided facial numbness, left arm numbness and left leg weakness. LKN was 11 PM last night. He was unsteady when attempting to walk. Some dysarthria was noted by his daughter. All symptoms had resolved except for left facial numbness by the time EMS arrived at 0138. The patient denies having associated headache, CP, vision loss, confusion, dysphasia or facial droop. He has no prior history of stroke. At the time of Neurological evaluation, all symptoms are resolved. Patient denies any seizure like activity prior, during or after the onset of the above neurological symptoms.   Past Medical History:  Diagnosis Date  . Blurred vision    "fluid in head"-   . Complication of anesthesia    " had difficulty waking up."  . Diabetes mellitus without complication ([place])    ?- patient states prediabetes  . DJD (degenerative joint disease)   . Elevated PSA   . Frequency of urination   . GERD (gastroesophageal reflux disease)    occ  . History of kidney stones    yrs ago- patient denies- "it was an Infection".  . Hypothyroidism   . Mixed hyperlipidemia    MILD PER PT  . Nocturia   . Seizures ([place])    x 2 2016- "none since taking Gabapentin"    Past Surgical History:  Procedure Laterality Date  . COLONOSCOPY W/ POLYPECTOMY     x 2  . TONSILLECTOMY      Family History  Problem Relation Age of Onset  . Diabetes Other    Social History:  reports that he quit smoking about 35 years ago. His smoking use included cigarettes. He quit after 15.00 years of use. he has never used smokeless tobacco. He reports that he does not drink alcohol or use drugs.  Allergies:  Allergies   Allergen Reactions  . Orange Fruit [Citrus] Rash    Medications:  Prior to Admission:  Medications Prior to Admission  Medication Sig Dispense Refill Last Dose  . acetaminophen (TYLENOL) 325 MG tablet Take 1-2 tablets (325-650 mg total) by mouth every 4 (four) hours as needed for mild pain.   unk  . capecitabine (XELODA) 500 MG tablet Take 1,500 mg 2 (two) times daily after a meal by mouth.   02/28/2017 at Unknown time  . ferrous sulfate 325 (65 FE) MG tablet Take 325 mg by mouth daily.   02/28/2017 at Unknown time  . levothyroxine (SYNTHROID, LEVOTHROID) 175 MCG tablet Take 1 tablet (175 mcg total) by mouth daily before breakfast. 30 tablet 1 02/28/2017 at Unknown time  . Multiple Vitamin (MULTIVITAMIN WITH MINERALS) TABS tablet Take 1 tablet daily by mouth.   02/28/2017 at Unknown time  . ondansetron (ZOFRAN) 4 MG tablet Take 1 tablet (4 mg total) by mouth every 8 (eight) hours as needed for nausea or vomiting. 20 tablet 0 Past Month at Unknown time  . oxyCODONE-acetaminophen (PERCOCET/ROXICET) 5-325 MG tablet Take 1-2 tablets by mouth every 4 (four) hours as needed for severe pain. 20 tablet 0 unk    ROS: No fever, SOB, vomiting, edema or dysuria. Other ROS as per HPI.   Physical Examination: Blood pressure (!) 144/87, pulse 76, temperature 98.5 F (  36.9 C), temperature source Oral, resp. rate 18, height _0  (1.702 m), weight 76.7 kg (169 lb), SpO2 99 %.  HEENT: [place]/AT Lungs: Respirations unlabored Ext: No edema.  Skin: Vitiligo noted.   Neurologic Examination: Mental Status: Alert, oriented, thought content appropriate.  Speech fluent without evidence of aphasia.  Able to follow all commands without difficulty. Cranial Nerves: II:  Visual fields intact all 4 quadrants bilaterally. No pupillary asymmetry noted. .   III,IV, VI: ptosis not present, EOMI V,VII: smile symmetric, facial temp sensation normal bilaterally VIII: hearing intact to voice IX,X: no hypophonia XI:  symmetric XII: midline tongue extension  Motor: Right : Upper extremity   5/5    Left:     Upper extremity   5/5 except subtle deltoid weakness  Lower extremity   5/5     Lower extremity   5/5 Normal tone throughout; no atrophy noted Sensory: Temp and light touch intact x 4. No extinction. Deep Tendon Reflexes:  2+ right brachioradialis and biceps. 3+ left brachioradialis and biceps.  2+ patellae bilaterally. 1+ achilles bilaterally. Plantars: Right: downgoing  Left: downgoing Cerebellar: No ataxia with FNF bilaterally Gait: Deferred  Results for orders placed or performed during the hospital encounter of 03/01/17 (from the past 48 hour(s))  Protime-INR     Status: None   Collection Time: 03/01/17  2:25 AM  Result Value Ref Range   Prothrombin Time 13.5 11.4 - 15.2 seconds   INR 1.04   APTT     Status: None   Collection Time: 03/01/17  2:25 AM  Result Value Ref Range   aPTT 34 24 - 36 seconds  CBC     Status: None   Collection Time: 03/01/17  2:25 AM  Result Value Ref Range   WBC 4.5 4.0 - 10.5 K/uL   RBC 4.43 4.22 - 5.81 MIL/uL   Hemoglobin 13.3 13.0 - 17.0 g/dL   HCT 40.3 39.0 - 52.0 %   MCV 91.0 78.0 - 100.0 fL   MCH 30.0 26.0 - 34.0 pg   MCHC 33.0 30.0 - 36.0 g/dL   RDW 13.1 11.5 - 15.5 %   Platelets 210 150 - 400 K/uL  Differential     Status: None   Collection Time: 03/01/17  2:25 AM  Result Value Ref Range   Neutrophils Relative % 70 %   Neutro Abs 3.1 1.7 - 7.7 K/uL   Lymphocytes Relative 17 %   Lymphs Abs 0.8 0.7 - 4.0 K/uL   Monocytes Relative 10 %   Monocytes Absolute 0.5 0.1 - 1.0 K/uL   Eosinophils Relative 3 %   Eosinophils Absolute 0.1 0.0 - 0.7 K/uL   Basophils Relative 0 %   Basophils Absolute 0.0 0.0 - 0.1 K/uL  Comprehensive metabolic panel     Status: Abnormal   Collection Time: 03/01/17  2:25 AM  Result Value Ref Range   Sodium 140 135 - 145 mmol/L   Potassium 4.1 3.5 - 5.1 mmol/L   Chloride 105 101 - 111 mmol/L   CO2 29 22 - 32 mmol/L    Glucose, Bld 115 (H) 65 - 99 mg/dL   BUN 6 6 - 20 mg/dL   Creatinine, Ser 0.89 0.61 - 1.24 mg/dL   Calcium 8.8 (L) 8.9 - 10.3 mg/dL   Total Protein 6.9 6.5 - 8.1 g/dL   Albumin 3.7 3.5 - 5.0 g/dL   AST 31 15 - 41 U/L   ALT 27 17 - 63 U/L   Alkaline Phosphatase  135 (H) 38 - 126 U/L   Total Bilirubin 0.5 0.3 - 1.2 mg/dL   GFR calc non Af Amer >60 >60 mL/min   GFR calc Af Amer >60 >60 mL/min    Comment: (NOTE) The eGFR has been calculated using the CKD EPI equation. This calculation has not been validated in all clinical situations. eGFR's persistently <60 mL/min signify possible Chronic Kidney Disease.    Anion gap 6 5 - 15  I-stat troponin, ED     Status: None   Collection Time: 03/01/17  2:40 AM  Result Value Ref Range   Troponin i, poc 0.00 0.00 - 0.08 ng/mL   Comment 3            Comment: Due to the release kinetics of cTnI, a negative result within the first hours of the onset of symptoms does not rule out myocardial infarction with certainty. If myocardial infarction is still suspected, repeat the test at appropriate intervals.   I-Stat Chem 8, ED     Status: Abnormal   Collection Time: 03/01/17  2:41 AM  Result Value Ref Range   Sodium 142 135 - 145 mmol/L   Potassium 4.0 3.5 - 5.1 mmol/L   Chloride 103 101 - 111 mmol/L   BUN 8 6 - 20 mg/dL   Creatinine, Ser 0.80 0.61 - 1.24 mg/dL   Glucose, Bld 115 (H) 65 - 99 mg/dL   Calcium, Ion 1.14 (L) 1.15 - 1.40 mmol/L   TCO2 27 22 - 32 mmol/L   Hemoglobin 13.6 13.0 - 17.0 g/dL   HCT 40.0 39.0 - 52.0 %  CBG monitoring, ED     Status: Abnormal   Collection Time: 03/01/17  3:01 AM  Result Value Ref Range   Glucose-Capillary 107 (H) 65 - 99 mg/dL  Urinalysis, Routine w reflex microscopic     Status: Abnormal   Collection Time: 03/01/17  3:30 AM  Result Value Ref Range   Color, Urine STRAW (A) YELLOW   APPearance CLEAR CLEAR   Specific Gravity, Urine 1.008 1.005 - 1.030   pH 7.0 5.0 - 8.0   Glucose, UA NEGATIVE NEGATIVE  mg/dL   Hgb urine dipstick NEGATIVE NEGATIVE   Bilirubin Urine NEGATIVE NEGATIVE   Ketones, ur NEGATIVE NEGATIVE mg/dL   Protein, ur NEGATIVE NEGATIVE mg/dL   Nitrite NEGATIVE NEGATIVE   Leukocytes, UA NEGATIVE NEGATIVE  Hemoglobin A1c     Status: None   Collection Time: 03/01/17  5:17 AM  Result Value Ref Range   Hgb A1c MFr Bld 5.6 4.8 - 5.6 %    Comment: (NOTE) Pre diabetes:          5.7%-6.4% Diabetes:              >6.4% Glycemic control for   <7.0% adults with diabetes    Mean Plasma Glucose 114.02 mg/dL  Lipid panel     Status: Abnormal   Collection Time: 03/01/17  5:17 AM  Result Value Ref Range   Cholesterol 150 0 - 200 mg/dL   Triglycerides 111 <150 mg/dL   HDL 38 (L) >40 mg/dL   Total CHOL/HDL Ratio 3.9 RATIO   VLDL 22 0 - 40 mg/dL   LDL Cholesterol 90 0 - 99 mg/dL    Comment:        Total Cholesterol/HDL:CHD Risk Coronary Heart Disease Risk Table                     Men   Women  1/2  Average Risk   3.4   3.3  Average Risk       5.0   4.4  2 X Average Risk   9.6   7.1  3 X Average Risk  23.4   11.0        Use the calculated Patient Ratio above and the CHD Risk Table to determine the patient's CHD Risk.        ATP III CLASSIFICATION (LDL):  <100     mg/dL   Optimal  100-129  mg/dL   Near or Above                    Optimal  130-159  mg/dL   Borderline  160-189  mg/dL   High  >190     mg/dL   Very High    Ct Head Wo Contrast  Result Date: 03/01/2017 CLINICAL DATA:  Transient ischemic attack EXAM: CT HEAD WITHOUT CONTRAST TECHNIQUE: Contiguous axial images were obtained from the base of the skull through the vertex without intravenous contrast. COMPARISON:  Head CT 01/19/2016 FINDINGS: Brain: No acute hemorrhage or evidence acute infarct. Small right frontal convexity extra-axial collection underlying craniectomy site shows no evidence of acute abnormality. There is superior right frontal encephalomalacia at the resection site. Brain parenchyma is generally  otherwise normal for age. Vascular: No hyperdense vessel or unexpected calcification. Skull: Status post bilateral frontal and partial right parietal craniectomy. Sinuses/Orbits: No sinus fluid levels or advanced mucosal thickening. No mastoid effusion. Normal orbits. IMPRESSION: 1. No acute intracranial abnormality. 2. Postoperative findings of remote right frontal lobe lesion resection and bifrontal craniectomy. 3. Small right frontal convexity extra-axial collection is likely postsurgical in shows no evidence of acute hemorrhage. Electronically Signed   By: Ulyses Jarred M.D.   On: 03/01/2017 03:34    Assessment: 71 y.o. male with TIA 1. Neurological exam shows no findings consistent with new deficit. Mild left sided hyperreflexia and subtle left deltoid weakness most likely secondary to chronic sequelae of prior meningioma resection with corresponding mild encephalomalacia along the high convexity of the right frontal lobe on MRI.  2. MRI and CT show no acute infarction. Postoperative findings of remote right frontal lobe lesion resection and bifrontal craniectomy are noted. Also noted is a small right frontal convexity extra-axial collection, most likely postsurgical, which shows no evidence of acute hemorrhage. 3. 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 This might be artifactual, or less likely this might reflect chronic right PCA occlusion. Consider follow-up CTA Head with IV contrast to further evaluate. 4. Stroke Risk Factors - cancer, prediabetes, hyperlipidemia 5. History of seizures x 2.   Plan: 1. HgbA1c, fasting lipid panel 2. Carotid ultrasound 3. TTE 4. Agree with starting ASA 5. PT consult, OT consult, Speech consult 6. Telemetry monitoring 7. Frequent neuro checks 8. Permissive HTN x 24 hours  _0  signed: Dr. Kerney Elbe  03/01/2017, 7:29  AM

## 2017-03-01 NOTE — Progress Notes (Signed)
SLP Cancellation Note  Patient Details Name: Geoffrey Neal MRN: [id number] DOB: [birth year]   Cancelled treatment:       Reason Eval/Treat Not Completed: SLP screened, no needs identified, will sign off   Mortimer Bair, Katherene Ponto 03/01/2017, 10:53 AM

## 2017-03-01 NOTE — Progress Notes (Signed)
OT Cancellation Note  Patient Details Name: THANH MOTTERN MRN: [id number] DOB: [birth year]   Cancelled Treatment:    Reason Eval/Treat Not Completed: Patient at procedure or test/ unavailable. OT will continue to follow as schedule allows for eval.  Merri Ray Torianne Laflam 03/01/2017, 9:43 AM  Hulda Humphrey OTR/L [phone]

## 2017-03-01 NOTE — ED Notes (Signed)
Patient transported to CT 

## 2017-03-01 NOTE — ED Triage Notes (Signed)
Per [facility], patient arrived from home after waking up at 0045 with left sided numbness and tingling. Patient attempted to walk and felt unsteady, but denies fall. EMS states at 0138 all symptoms had resolved except for left sided facial numbness. EMS reports negative stroke screen. EMS reports patient has pancreatic cancer and receiving chemotherapy and radiation since 02/21/17.

## 2017-03-01 NOTE — ED Notes (Signed)
Patient is going to MRI prior to going to his room, called %W to make aware and MRI will transport to 5W

## 2017-03-01 NOTE — ED Provider Notes (Addendum)
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  Went to bed around 11 PM and felt normal.  Woke up around 12:45 AM and states he went to the bathroom and noticed that he could not walk as well because his left side was numb and weak.  It involved the face, arm and leg.  Symptoms resolved upon me seeing patient in the emergency department.  No headache or head injury.  Not on blood thinners.  No chest pain or shortness of breath.  No history of TIA or CVA.  Has never had similar symptoms.  ROS: See HPI Constitutional: no fever  Eyes: no drainage  ENT: no runny nose   Cardiovascular:  no chest pain  Resp: no SOB  GI: no vomiting GU: no dysuria Integumentary: no rash  Allergy: no hives  Musculoskeletal: no leg swelling  Neurological: no slurred speech ROS otherwise negative  PAST MEDICAL HISTORY/PAST SURGICAL HISTORY:  Past Medical History:  Diagnosis Date  . Blurred vision    "fluid in head"-   . Complication of anesthesia    " had difficulty waking up."  . Diabetes mellitus without complication ([place])    ?- patient states prediabetes  . DJD (degenerative joint disease)   . Elevated PSA   . Frequency of urination   . GERD (gastroesophageal reflux disease)    occ  . History of kidney stones    yrs ago- patient denies- "it was an Infection".  . Hypothyroidism   . Mixed hyperlipidemia    MILD PER PT  . Nocturia   . Seizures ([place])    x 2 2016- "none since taking Gabapentin"    MEDICATIONS:  Prior to Admission medications   Medication Sig Start Date End Date Taking? Authorizing Provider  acetaminophen (TYLENOL) 325 MG tablet  Take 1-2 tablets (325-650 mg total) by mouth every 4 (four) hours as needed for mild pain. Patient not taking: Reported on 09/29/2016 06/05/14   Angiulli, Lavon Paganini, PA-C  ferrous sulfate 325 (65 FE) MG tablet Take 325 mg by mouth daily. 07/17/16 07/17/17  [provider]  levETIRAcetam (KEPPRA) 500 MG tablet Take 1 tablet (500 mg total) by mouth 2 (two) times daily. 01/19/16   Rancour, Annie Main, MD  levothyroxine (SYNTHROID, LEVOTHROID) 175 MCG tablet Take 1 tablet (175 mcg total) by mouth daily before breakfast. 06/05/14   Angiulli, Lavon Paganini, PA-C  metFORMIN (GLUCOPHAGE) 500 MG tablet Take 500 mg by mouth 2 (two) times daily with a meal.    [provider]  mineral oil-hydrophilic petrolatum (AQUAPHOR) ointment Apply 1 application topically as needed for rash. 07/28/16   [provider]  ondansetron (ZOFRAN) 4 MG tablet Take 1 tablet (4 mg total) by mouth every 8 (eight) hours as needed for nausea or vomiting. 09/29/16   Clayton Bibles, PA-C  oxyCODONE-acetaminophen (PERCOCET/ROXICET) 5-325 MG tablet Take 1-2 tablets by mouth every 4 (four) hours as needed for severe pain. 09/29/16   Clayton Bibles, PA-C  topiramate (TOPAMAX) 25 MG tablet Take 1 tablet (25 mg total) by mouth at bedtime. Patient not taking: Reported on 01/19/2016 06/05/14   Cathlyn Parsons, PA-C    ALLERGIES:  Allergies  Allergen Reactions  . Orange Fruit [Citrus]  Rash    SOCIAL HISTORY:  Social History   Tobacco Use  . Smoking status: Former Smoker    Years: 15.00    Types: Cigarettes    Last attempt to quit: 05/05/1981    Years since quitting: 35.8  . Smokeless tobacco: Never Used  . Tobacco comment: QUIT SMOKING 34 YRS AGO--  APPROX. 1980'S  Substance Use Topics  . Alcohol use: No    Comment: quit 34 yrs ago    FAMILY HISTORY: No family history on file.  EXAM: BP (!) 142/95 (BP Location: Left Arm)   Pulse 76   Resp 18   Ht 5\' 7"  (1.702 m)   Wt 76.7 kg (169 lb)   SpO2 95%   BMI 26.47 kg/m   CONSTITUTIONAL: Alert and oriented and responds appropriately to questions. Well-appearing; well-nourished HEAD: Normocephalic EYES: Conjunctivae clear, pupils appear equal, EOMI ENT: normal nose; moist mucous membranes NECK: Supple, no meningismus, no nuchal rigidity, no LAD  CARD: RRR; S1 and S2 appreciated; no murmurs, no clicks, no rubs, no gallops RESP: Normal chest excursion without splinting or tachypnea; breath sounds clear and equal bilaterally; no wheezes, no rhonchi, no rales, no hypoxia or respiratory distress, speaking full sentences ABD/GI: Normal bowel sounds; non-distended; soft, non-tender, no rebound, no guarding, no peritoneal signs, no hepatosplenomegaly BACK:  The back appears normal and is non-tender to palpation, there is no CVA tenderness EXT: Normal ROM in all joints; non-tender to palpation; no edema; normal capillary refill; no cyanosis, no calf tenderness or swelling    SKIN: Normal color for age and race; warm; no rash NEURO: Moves all extremities equally; Strength 5/5 in all four extremities.  Normal sensation diffusely.  CN 2-12 grossly intact.  No dysmetria to finger to nose testing bilaterally.  Normal speech.  No pronator drift. PSYCH: The patient's mood and manner are appropriate. Grooming and personal hygiene are appropriate.  MEDICAL DECISION MAKING: Patient here with possible TIA.  He had left-sided numbness and weakness.  This has completely resolved and he is now neurologically intact.  Will obtain labs, urine, CT of the head.  EKG shows no ischemic abnormality.  No arrhythmia.  He is hemodynamically stable.  ED PROGRESS: 3:55 AM  Head CT shows no acute abnormalities.  Discussed with Dr. Cheral Marker with neurology who will see patient in consult.  Agrees that this could be possible TIA and agrees with medical admission.  4:08 AM Discussed patient's case with hospitalist, Dr. Myna Hidalgo.  I have recommended admission and patient (and family if present) agree with this  plan. Admitting physician will place admission orders.   I reviewed all nursing notes, vitals, pertinent previous records, EKGs, lab and urine results, imaging (as available).     EKG Interpretation  Date/Time:  Thursday March 01 2017 02:20:07 EST Ventricular Rate:  74 PR Interval:    QRS Duration: 92 QT Interval:  396 QTC Calculation: 440 R Axis:   9 Text Interpretation:  Sinus rhythm Abnormal R-wave progression, early transition Borderline T wave abnormalities No significant change since last tracing Confirmed by Brigett Estell, Cyril Mourning [id number]) on 03/01/2017 2:23:27 AM          Breckan Cafiero, Delice Bison, DO 03/01/17 0408    Bobbie Virden, Delice Bison, DO 03/01/17 0522

## 2017-03-01 NOTE — Progress Notes (Signed)
Paged Dr. Wendee Beavers per patient request, all tests and imaging has been completed and patient would like to discharge as soon as possible.

## 2017-03-01 NOTE — Progress Notes (Signed)
EEG completed, results pending. 

## 2017-03-01 NOTE — ED Notes (Signed)
Patient transported to MRI 

## 2017-03-01 NOTE — Progress Notes (Signed)
STROKE TEAM PROGRESS NOTE   SUBJECTIVE (INTERVAL HISTORY) His  daughter  is at the bedside.  Patient is found laying in bed in NAD. Overall he feels his condition is completely resolved.  Voices no new complaints. No new events reported overnight. Daughter was able to clearly translate patients recent history of Pancreatic Cancer with Chemo/Rad - Dx 4 months ago, Hx of follow up appointment with Dr Herold Harms -Neuorsurgery and stopping Keppra in July 2018, and of the events of last night that brought the patient to the hospital. She states she witnessed no seizure activity. Patient was only complaining of Left arm and face numbness and all of these symptoms have completely resolved today   OBJECTIVE Recent Labs  Lab 03/01/17 0301 03/01/17 0812 03/01/17 1232  GLUCAP 107* 106* 93   Recent Labs  Lab 03/01/17 0225 03/01/17 0241  NA 140 142  K 4.1 4.0  CL 105 103  CO2 29  --   GLUCOSE 115* 115*  BUN 6 8  CREATININE 0.89 0.80  CALCIUM 8.8*  --    Recent Labs  Lab 03/01/17 0225  AST 31  ALT 27  ALKPHOS 135*  BILITOT 0.5  PROT 6.9  ALBUMIN 3.7   Recent Labs  Lab 03/01/17 0225 03/01/17 0241  WBC 4.5  --   NEUTROABS 3.1  --   HGB 13.3 13.6  HCT 40.3 40.0  MCV 91.0  --   PLT 210  --    No results for input(s): CKTOTAL, CKMB, CKMBINDEX, TROPONINI in the last 168 hours. Recent Labs    03/01/17 0225  LABPROT 13.5  INR 1.04   Recent Labs    03/01/17 0330  COLORURINE STRAW*  LABSPEC 1.008  PHURINE 7.0  GLUCOSEU NEGATIVE  HGBUR NEGATIVE  BILIRUBINUR NEGATIVE  KETONESUR NEGATIVE  PROTEINUR NEGATIVE  NITRITE NEGATIVE  LEUKOCYTESUR NEGATIVE       Component Value Date/Time   CHOL 150 03/01/2017 0517   TRIG 111 03/01/2017 0517   HDL 38 (L) 03/01/2017 0517   CHOLHDL 3.9 03/01/2017 0517   VLDL 22 03/01/2017 0517   LDLCALC 90 03/01/2017 0517   Lab Results  Component Value Date   HGBA1C 5.6 03/01/2017   No results found for: LABOPIA, COCAINSCRNUR, LABBENZ,  AMPHETMU, THCU, LABBARB  No results for input(s): ETH in the last 168 hours.  IMAGING: I have personally reviewed the radiological images below and agree with the radiology interpretations.  Ct Head Wo Contrast 03/01/2017 IMPRESSION: 1. No acute intracranial abnormality. 2. Postoperative findings of remote right frontal lobe lesion resection and bifrontal craniectomy. 3. Small right frontal convexity extra-axial collection is likely postsurgical in shows no evidence of acute hemorrhage.   Mri and Mra Brain Wo Contrast 03/01/2017 IMPRESSION: 1. MRA suggests occlusion of the Right PCA in the P1 segment, although there is no acute infarct or signal abnormality in the right PCA territory. This might be artifactual, or less likely this might reflect chronic right PCA occlusion. Consider follow-up CTA Head with IV contrast to further evaluate. 2. No acute infarct or other acute intracranial abnormality. 3. Stable postoperative appearance of the brain, with chronic right superior frontal gyrus encephalomalacia, aside from mildly increased chronic extra-axial fluid volume since 2017 deep to the cranioplasty repair.   EEG: awake and drowsy EEG is normal.    ECHO:  - Left ventricle: The cavity size was normal. Systolic function was   normal. Wall motion was normal; there were no regional wall   motion abnormalities. Doppler parameters are  consistent with   abnormal left ventricular relaxation (grade 1 diastolic   dysfunction  B/L Carotid Doppler:  1-39% ICA plaquing. Vertebral artery flow is antegrade.  B/L LE U/S:   There is no DVT or SVT noted in the bilateral lower extremities    PHYSICAL EXAM Temp:  [98.1 F (36.7 C)-98.5 F (36.9 C)] 98.1 F (36.7 C) (11/08 1220) Pulse Rate:  [67-85] 67 (11/08 1220) Resp:  [18-20] 18 (11/08 1220) BP: (124-144)/(73-97) 124/73 (11/08 1220) SpO2:  [95 %-99 %] 98 % (11/08 1220) Weight:  [73.3 kg (161 lb 11.2 oz)-76.7 kg (169 lb)] 73.3 kg (161 lb 11.2 oz)  (11/08 0723)  General - Well nourished, well developed, in no apparent distress Respiratory - Lungs clear bilaterally. No wheezing. Cardiovascular - Regular rate and rhythm   Mental Status -  Level of arousal and orientation to time, place, and person were intact. Language including expression, naming, repetition, comprehension was assessed and found intact. Attention span and concentration were normal Recent and remote memory were intact Fund of Knowledge was assessed and was intact  Cranial Nerves II - XII - II - Visual field intact OU III, IV, VI - Extraocular movements intact. V - Facial sensation intact bilaterally. VII - Facial movement intact bilaterally VIII - Hearing & vestibular intact bilaterally X - Palate elevates symmetrically XI - Chin turning & shoulder shrug intact bilaterally. XII - Tongue protrusion intact  Motor Strength - The patient's strength was normal in all extremities and pronator drift was absent.  Bulk was normal and fasciculations were absent  Motor Tone - Muscle tone was assessed at the neck and appendages and was normal Reflexes - The patient's reflexes were symmetrical in all extremities and he had no pathological reflexes Sensory - Light touch was assessed and was symmetrical Coordination - The patient had normal movements in the hands and feet with no ataxia or dysmetria.  Tremor was absent Gait and Station - deferred.   ASSESSMENT AND PLAN: Geoffrey Neal is a 71 y.o. male with PMH of pancreatic cancer s/p surgery currently on chemotherapy and radiation therapy, prior right frontal meningioma resection and prior seizures - discontinued Keppra in July 2018 by Neurosurgery Dr Herold Harms  admitted for left sided facial numbness, left arm numbness and left leg weakness.   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  2D Echo - normal EF  EEEG - Negative  B/L Carotid Doppler - unremarkable  B/L LE U/S - unremarkable  LDL - 90  HgbA1c 5.6  VTE prophylaxis - Lovenox   Diet - Diet Carb Modified Fluid consistency: Thin; Room service appropriate? Yes   CVA Meds-   No antithrombotic prior to admission, now on aspirin 325 mg daily.   Please discharge patient on  aspirin 325 mg daily  Patient counseled to be compliant with her antithrombotic medications  Ongoing aggressive stroke risk factor management  Therapy recommendations:  HOME   Disposition: HOME   Follow up with [facility], Cecille Rubin NP in 6 weeks  HYPERTENSION: Stable Permissive hypertension (OK if <220/120) for 24-48 hours post stroke and then gradually normalized within 5-7 days.  Long term BP goal normotensive.   Close PCP follow up.  HYPERLIPIDEMIA:  Home meds:  NONE  LDL  90 , goal < 70  Started on Lipitor to 20 mg daily  Continue statin at discharge  DIABETES:  HgbA1c 5.6, goal < 7.0  Controlled  CBG monitoring and SSI  PCP follow up  S/p frontal meningioma resection  Seizure x 3 post procedure, no seizure since  Was on keppra  Followed with Dr. Christella Noa and took off keppra 6 months ago  Other Stroke Risk Factors:  Advanced age  Common bile duct Cancer s/p Sx and active Chemo and [facility] day # 0   Renie Ora Stroke Neurology Team 03/01/2017 1:10 PM  I reviewed above note and agree with the assessment and plan. I have made any additions or clarifications directly to the above note. Pt was seen and examined. Pt back to baseline. Consistent with TIA, stroke work up negative. Recommend ASA and lipitor low dose. Continue to follow up with PCP and Dr. Christella Noa. OK to be off keppra since no seizure. EEG negative. Neurology to sign-off at this time. Please call with any further  questions or concerns. Thank you for this consultation. Follow up with Cecille Rubin at [facility] in 6 weeks.   Rosalin Hawking, MD PhD Stroke Neurology 03/01/2017 6:01 PM     To contact Stroke Continuity provider, please refer to [email or web address] After hours, contact General Neurology

## 2017-03-01 NOTE — Progress Notes (Signed)
OT Cancellation Note  Patient Details Name: RAYON MCCHRISTIAN MRN: [id number] DOB: Aug 18, 1945   Cancelled Treatment:    Reason Eval/Treat Not Completed: OT screened, no needs identified, will sign off. After consulting with PT, Pt independent. Thank you for this referral.   Jaci Carrel 03/01/2017, 12:42 PM  Hulda Humphrey OTR/L [phone]

## 2017-03-01 NOTE — Procedures (Signed)
ELECTROENCEPHALOGRAM REPORT  Date of Study: 03/01/2017  Patient's Name: Geoffrey Neal MRN: [id number] Date of Birth: [birth year]  Referring Provider: Rosalin Hawking, MD  Clinical History: 71 y.o. male with a history of pancreatic cancer s/p surgery currently on chemotherapy and radiation therapy, prior right frontal meningioma resection and prior seizures who presented to the ED with left sided facial numbness, left arm numbness and left leg weakness.  Medications: acetaminophen (TYLENOL)  aspirin atorvastatin (LIPITOR)  enoxaparin (LOVENOX)  insulin aspart (novoLOG) levETIRAcetam (KEPPRA)  levothyroxine (SYNTHROID, LEVOTHROID)   oxyCODONE-acetaminophen (PERCOCET/ROXICET)  senna-docusate (Senokot-S)   Technical Summary: A multichannel digital EEG recording measured by the international 10-20 system with electrodes applied with paste and impedances below 5000 ohms performed in our laboratory with EKG monitoring in an awake and drowsy patient.  Hyperventilation was not performed.  Photic stimulation was performed.  The digital EEG was referentially recorded, reformatted, and digitally filtered in a variety of bipolar and referential montages for optimal display.    Description: The patient is awake and drowsy during the recording.  During maximal wakefulness, there is a symmetric, medium voltage 9 Hz posterior dominant rhythm that attenuates with eye opening.  The record is symmetric.  During drowsiness, there is an increase in theta slowing of the background.  Stage 2 sleep is not seen.  Photic stimulation did not elicit any abnormalities.  There were no epileptiform discharges or electrographic seizures seen.    EKG lead was unremarkable.  Impression: This awake and drowsy EEG is normal.    Clinical Correlation: A normal EEG does not exclude a clinical diagnosis of epilepsy.  If further clinical questions remain, prolonged EEG may be helpful.  Clinical correlation is advised.   Metta Clines, DO

## 2017-03-01 NOTE — Evaluation (Signed)
Physical Therapy Evaluation Patient Details Name: Geoffrey Neal MRN: [id number] DOB: Aug 01, 1945 Today's Date: 03/01/2017   History of Present Illness  Geoffrey Neal is a 71 y.o. 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  Patient reports that he woke overnight with left-sided numbness and tingling, and noted that he was unsteady on his feet while walking.   Clinical Impression  Patient evaluated by Physical Therapy with no further acute PT needs identified. All education has been completed and the patient has no further questions. * See below for any follow-up Physical Therapy or equipment needs. PT is signing off. Thank you for this referral. Pt doing very well,  Performing grooming tasks independently on PT arrival; pt and dtr feel he is back to baseline    Follow Up Recommendations No PT follow up    Equipment Recommendations  None recommended by PT    Recommendations for Other Services       Precautions / Restrictions Precautions Precautions: None Restrictions Weight Bearing Restrictions: No      Mobility  Bed Mobility Overal bed mobility: Independent                Transfers Overall transfer level: Independent                  Ambulation/Gait Ambulation/Gait assistance: Independent Ambulation Distance (Feet): 500 Feet Assistive device: None Gait Pattern/deviations: Step-through pattern     General Gait Details: no LOB, steady gait  Stairs            Wheelchair Mobility    Modified Rankin (Stroke Patients Only)       Balance Overall balance assessment: Needs assistance   Sitting balance-Leahy Scale: Normal       Standing balance-Leahy Scale: Normal   Single Leg Stance - Right Leg:  8 Single Leg Stance - Left Leg: 6         High level balance activites: Head turns;Turns;Direction changes               Pertinent Vitals/Pain Pain Assessment: No/denies pain    Home Living Family/patient expects to be discharged to:: Private residence Living Arrangements: Spouse/significant other;Children Available Help at Discharge: Available 24 hours/day;Available PRN/intermittently Type of Home: House Home Access: Stairs to enter   Entrance Stairs-Number of Steps: 5 Home Layout: Two level;Able to live on main level with bedroom/bathroom Home Equipment: None Additional Comments: pt is very active adn IND at basline    Prior Function Level of Independence: Independent               Hand Dominance        Extremity/Trunk Assessment   Upper Extremity Assessment Upper Extremity Assessment: Overall WFL for tasks assessed    Lower Extremity Assessment Lower Extremity Assessment: Overall WFL for tasks assessed       Communication   Communication: (pt dtr partially interprets; pt speaks some [place])  Cognition Arousal/Alertness: Awake/alert Behavior During Therapy: WFL for tasks assessed/performed Overall Cognitive Status: Within Functional Limits for tasks assessed                                        General Comments      Exercises  Assessment/Plan    PT Assessment Patent does not need any further PT services  PT Problem List         PT Treatment Interventions      PT Goals (Current goals can be found in the Care Plan section)  Acute Rehab PT Goals PT Goal Formulation: All assessment and education complete, DC therapy    Frequency     Barriers to discharge        Co-evaluation               AM-PAC PT "6 Clicks" Daily Activity  Outcome Measure Difficulty turning over in bed (including adjusting bedclothes, sheets and blankets)?: None Difficulty moving from lying on back to sitting on the side of the bed?  : None Difficulty sitting down on and standing up from a chair with arms (e.g., wheelchair, bedside commode, etc,.)?: None Help needed moving to and from a bed to chair (including a wheelchair)?: None Help needed walking in hospital room?: None Help needed climbing 3-5 steps with a railing? : None 6 Click Score: 24    End of Session Equipment Utilized During Treatment: Gait belt Activity Tolerance: Patient tolerated treatment well Patient left: in bed;with call bell/phone within reach;with family/visitor present   PT Visit Diagnosis: Difficulty in walking, not elsewhere classified (R26.2)    Time: 7591-6384 PT Time Calculation (min) (ACUTE ONLY): 12 min   Charges:   PT Evaluation $PT Eval Low Complexity: 1 Low     PT G CodesKenyon Ana, PT Pager: [phone] 03/01/2017   [facility] 03/01/2017, 11:24 AM

## 2017-03-01 NOTE — Progress Notes (Signed)
VASCULAR LAB PRELIMINARY  PRELIMINARY  PRELIMINARY  PRELIMINARY  Bilateral venous duplex completed.    Preliminary report:  There is no DVT or SVT noted in the bilateral lower extremities.   Geoffrey Neal, RVT 03/01/2017, 2:52 PM

## 2017-03-01 NOTE — Progress Notes (Signed)
Geoffrey Neal to be D/C'd Home per MD order.  Discussed with the patient and all questions fully answered.  VSS, Skin clean, dry and intact without evidence of skin break down, no evidence of skin tears noted. IV catheter discontinued intact. Site without signs and symptoms of complications. Dressing and pressure applied.  An After Visit Summary was printed and given to the patient. Patient received prescription.  D/c education completed with patient/family including follow up instructions, medication list, d/c activities limitations if indicated, with other d/c instructions as indicated by MD - patient able to verbalize understanding, all questions fully answered.   Patient instructed to return to ED, call 911, or call MD for any changes in condition.   Patient escorted via [place], and D/C home via private auto.   [name] 03/01/2017 7:51 PM

## 2017-03-01 NOTE — Progress Notes (Signed)
VASCULAR LAB PRELIMINARY  PRELIMINARY  PRELIMINARY  PRELIMINARY  Carotid duplex completed.    Preliminary report:  1-39% ICA plaquing. Vertebral artery flow is antegrade.  Emberlie Gotcher, RVT 03/01/2017, 2:55 PM

## 2017-03-01 NOTE — Discharge Summary (Addendum)
Physician Discharge Summary  Geoffrey Neal HWE:[id number] DOB: [birth year] DOA: 03/01/2017  PCP: [facility], [facility]  Admit date: 03/01/2017 Discharge date: 03/01/2017  Time spent: 20 minutes  Recommendations for Outpatient Follow-up:  1. Ensure f/u with Neurologist. 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 male with PMH of pancreatic cancer s/p surgery currently on chemotherapy and radiation therapy, prior right frontal meningioma resection and prior seizures who presented to the hospital with transient left-sided facial numbness left arm numbness and left leg weakness.    Hospital Course:  TIA most likely secondary to hypercoagulable state secondary to cancer - Evaluated by neurology who recommended the following  LDL-90  HgbA1c5.6  VTE prophylaxis- Lovenox   Diet - Diet Carb Modified Fluid consistency: Thin; Room service appropriate? Yes   CVA Meds-   No antithrombotic prior to admission, now on aspirin 325 mg daily.   Please discharge patient on  aspirin 325 mg daily  Patient counseled to be compliant withherantithrombotic medications  Ongoing aggressive stroke risk factor management  Therapy recommendations: HOME   Disposition: HOME         Follow up Appointments:    PCP follow up   Follow up with [facility], Cecille Rubin NP in 6 weeks   HYPERTENSION:  Stable  Permissive hypertension (OK if <220/120) for 24-48 hours post stroke and then gradually normalized within 5-7 days.  Long term BP goal  normotensive.   Close PCP follow up.  HYPERLIPIDEMIA:  Home meds:  NONE  LDL      90 , goal < 70  Continue statin at discharge  Procedures:  Stroke work up  Consultations:  Neurology  Discharge Exam: Vitals:   03/01/17 1220 03/01/17 1300  BP: 124/73 135/80  Pulse: 67 70  Resp: 18 16  Temp: 98.1 F (36.7 C)   SpO2: 98% 96%    General: Pt in nad, alert and awake Cardiovascular: rrr, no rubs Respiratory: no increased wob, no wheezes  Discharge Instructions   Discharge Instructions    Ambulatory referral to Neurology   Complete by:  As directed    An appointment is requested in approximately: 6 weeks Follow up with stroke clinic Cecille Rubin preferred, if not available, then consider Caesar Chestnut, [facility] or Jaynee Eagles whoever is available) at [facility] in about 6-8 weeks. Thanks.   Diet - low sodium heart healthy   Complete by:  As directed    Discharge instructions   Complete by:  As directed    Please follow up with your neurologist after hospital discharge.   Increase activity slowly   Complete by:  As directed      Current Discharge Medication List    START taking these medications   Details  aspirin 325 MG tablet Take 1 tablet (325 mg total) daily by mouth. Qty: 30 tablet, Refills: 0    atorvastatin (LIPITOR) 20 MG tablet Take 1 tablet (20 mg total) daily at 6 PM by mouth. Qty: 30 tablet, Refills: 0      CONTINUE these  medications which have NOT CHANGED   Details  acetaminophen (TYLENOL) 325 MG tablet Take 1-2 tablets (325-650 mg total) by mouth every 4 (four) hours as needed for mild pain.    capecitabine (XELODA) 500 MG tablet Take 1,500 mg 2 (two) times daily after a meal by mouth.    ferrous sulfate 325 (65 FE) MG tablet Take 325 mg by mouth daily.    levothyroxine (SYNTHROID, LEVOTHROID) 175 MCG tablet Take 1 tablet (175 mcg total) by mouth daily before breakfast. Qty: 30 tablet, Refills: 1    Multiple Vitamin (MULTIVITAMIN WITH MINERALS) TABS  tablet Take 1 tablet daily by mouth.    ondansetron (ZOFRAN) 4 MG tablet Take 1 tablet (4 mg total) by mouth every 8 (eight) hours as needed for nausea or vomiting. Qty: 20 tablet, Refills: 0    oxyCODONE-acetaminophen (PERCOCET/ROXICET) 5-325 MG tablet Take 1-2 tablets by mouth every 4 (four) hours as needed for severe pain. Qty: 20 tablet, Refills: 0       Allergies  Allergen Reactions  . Orange Fruit [Citrus] Rash   Follow-up Information    Dennie Bible, NP. Schedule an appointment as soon as possible for a visit in 6 week(s).   Specialty:  Family Medicine Contact information: [address] [place] [address] [phone]            The results of significant diagnostics from this hospitalization (including imaging, microbiology, ancillary and laboratory) are listed below for reference.    Significant Diagnostic Studies: Ct Head Wo Contrast  Result Date: 03/01/2017 CLINICAL DATA:  Transient ischemic attack EXAM: CT HEAD WITHOUT CONTRAST TECHNIQUE: Contiguous axial images were obtained from the base of the skull through the vertex without intravenous contrast. COMPARISON:  Head CT 01/19/2016 FINDINGS: Brain: No acute hemorrhage or evidence acute infarct. Small right frontal convexity extra-axial collection underlying craniectomy site shows no evidence of acute abnormality. There is superior right frontal encephalomalacia at the resection site. Brain parenchyma is generally otherwise normal for age. Vascular: No hyperdense vessel or unexpected calcification. Skull: Status post bilateral frontal and partial right parietal craniectomy. Sinuses/Orbits: No sinus fluid levels or advanced mucosal thickening. No mastoid effusion. Normal orbits. IMPRESSION: 1. No acute intracranial abnormality. 2. Postoperative findings of remote right frontal lobe lesion resection and bifrontal craniectomy. 3. Small right frontal convexity extra-axial collection is likely  postsurgical in shows no evidence of acute hemorrhage. Electronically Signed   By: Ulyses Jarred M.D.   On: 03/01/2017 03:34   Mr Brain Wo Contrast  Addendum Date: 03/01/2017   ADDENDUM REPORT: 03/01/2017 07:54 ADDENDUM: Study discussed by telephone with hospitalist Dr. Wendee Beavers on 03/01/2017 at 0743 hours. Electronically Signed   By: Genevie Ann M.D.   On: 03/01/2017 07:54   Result Date: 03/01/2017 CLINICAL DATA:  71 year old male with left side weakness numbness and tingling. Personal history of resected meningioma with postop CSF leak. EXAM: MRI HEAD WITHOUT CONTRAST MRA HEAD WITHOUT CONTRAST TECHNIQUE: Multiplanar, multiecho pulse sequences of the brain and surrounding structures were obtained without intravenous contrast. Angiographic images of the head were obtained using MRA technique without contrast. COMPARISON:  Head CT without contrast 0312 hours today. Brain MRI 11/29/2015 and earlier. FINDINGS: MRI HEAD FINDINGS Brain: No restricted diffusion or evidence of acute infarction. Sequelae of anterior frontal convexity craniectomy cranioplasty with right superior frontal gyrus encephalomalacia re- demonstrated. Chronic fluid both superficial and deep to the cranioplasty implant again noted. The volume of fluid superficial to the cranioplasty is regressed compared 11/29/2015. The  volume of extra-axial fluid deep to the cranioplasty is mildly increased to 8 mm (6 mm in August 2017). No intracranial mass effect. Stable ventricle size and configuration. Normal basilar cisterns. Scattered hemosiderin along the superior convexities is stable. No acute intracranial hemorrhage, midline shift. Cervicomedullary junction and pituitary are within normal limits. Stable gray and white matter signal throughout the brain. Deep gray matter nuclei, brainstem and cerebellum remain within normal limits. Vascular: Major intracranial vascular flow voids are stable since 2017. Skull and upper cervical spine: Stable and negative  visualized cervical spine. Bone marrow signal remains normal. Sequelae of anterior frontal convexity craniectomy and cranioplasty. Sinuses/Orbits: Stable. Mild right frontal sinus mucosal thickening. Aside from the postoperative changes described above, scalp and face soft tissues appear stable. Other: Visible internal auditory structures appear normal. Mastoids remain clear. MRA HEAD FINDINGS Mildly degraded by motion. Antegrade flow in the posterior circulation. Dominant appearing distal left vertebral artery. No distal vertebral stenosis. Patent vertebrobasilar junction. Dominant appearing left AICA is patent. No basilar stenosis. SCA and PCA origins are within normal limits. Posterior communicating arteries are diminutive or absent. The left PCA P1 is normal. There is mild to moderate irregularity and stenosis in the left P2 with preserved distal PCA flow. There is absent right PCA flow beyond the right P1 segment, although the right PCA flow void appears to be stable and preserved on coronal T2 weighted images. Antegrade flow in both ICA siphons. Mild siphon irregularity. No siphon stenosis. Patent carotid termini. Normal MCA and ACA origins. Visible bilateral ACA branches are within normal limits. Bilateral MCA M1 segments, left MCA bifurcation and right MCA trifurcation are patent. Visible bilateral MCA branches are within normal limits. IMPRESSION: 1. MRA suggests occlusion of the Right PCA in the P1 segment, although there is no acute infarct or signal abnormality in the right PCA territory. This might be artifactual, or less likely this might reflect chronic right PCA occlusion. Consider follow-up CTA Head with IV contrast to further evaluate. 2. No acute infarct or other acute intracranial abnormality. 3. Stable postoperative appearance of the brain, with chronic right superior frontal gyrus encephalomalacia, aside from mildly increased chronic extra-axial fluid volume since 2017 deep to the cranioplasty  repair. Electronically Signed: By: Genevie Ann M.D. On: 03/01/2017 07:29   Mr Jodene Nam Head Wo Contrast  Addendum Date: 03/01/2017   ADDENDUM REPORT: 03/01/2017 07:54 ADDENDUM: Study discussed by telephone with hospitalist Dr. Wendee Beavers on 03/01/2017 at 0743 hours. Electronically Signed   By: Genevie Ann M.D.   On: 03/01/2017 07:54   Result Date: 03/01/2017 CLINICAL DATA:  71 year old male with left side weakness numbness and tingling. Personal history of resected meningioma with postop CSF leak. EXAM: MRI HEAD WITHOUT CONTRAST MRA HEAD WITHOUT CONTRAST TECHNIQUE: Multiplanar, multiecho pulse sequences of the brain and surrounding structures were obtained without intravenous contrast. Angiographic images of the head were obtained using MRA technique without contrast. COMPARISON:  Head CT without contrast 0312 hours today. Brain MRI 11/29/2015 and earlier. FINDINGS: MRI HEAD FINDINGS Brain: No restricted diffusion or evidence of acute infarction. Sequelae of anterior frontal convexity craniectomy cranioplasty with right superior frontal gyrus encephalomalacia re- demonstrated. Chronic fluid both superficial and deep to the cranioplasty implant again noted. The volume of fluid superficial to the cranioplasty is regressed compared 11/29/2015. The volume of extra-axial fluid deep to the cranioplasty is mildly increased to 8 mm (6 mm in August 2017). No intracranial mass effect. Stable ventricle size and configuration. Normal basilar cisterns. Scattered hemosiderin along the  superior convexities is stable. No acute intracranial hemorrhage, midline shift. Cervicomedullary junction and pituitary are within normal limits. Stable gray and white matter signal throughout the brain. Deep gray matter nuclei, brainstem and cerebellum remain within normal limits. Vascular: Major intracranial vascular flow voids are stable since 2017. Skull and upper cervical spine: Stable and negative visualized cervical spine. Bone marrow signal remains  normal. Sequelae of anterior frontal convexity craniectomy and cranioplasty. Sinuses/Orbits: Stable. Mild right frontal sinus mucosal thickening. Aside from the postoperative changes described above, scalp and face soft tissues appear stable. Other: Visible internal auditory structures appear normal. Mastoids remain clear. MRA HEAD FINDINGS Mildly degraded by motion. Antegrade flow in the posterior circulation. Dominant appearing distal left vertebral artery. No distal vertebral stenosis. Patent vertebrobasilar junction. Dominant appearing left AICA is patent. No basilar stenosis. SCA and PCA origins are within normal limits. Posterior communicating arteries are diminutive or absent. The left PCA P1 is normal. There is mild to moderate irregularity and stenosis in the left P2 with preserved distal PCA flow. There is absent right PCA flow beyond the right P1 segment, although the right PCA flow void appears to be stable and preserved on coronal T2 weighted images. Antegrade flow in both ICA siphons. Mild siphon irregularity. No siphon stenosis. Patent carotid termini. Normal MCA and ACA origins. Visible bilateral ACA branches are within normal limits. Bilateral MCA M1 segments, left MCA bifurcation and right MCA trifurcation are patent. Visible bilateral MCA branches are within normal limits. IMPRESSION: 1. MRA suggests occlusion of the Right PCA in the P1 segment, although there is no acute infarct or signal abnormality in the right PCA territory. This might be artifactual, or less likely this might reflect chronic right PCA occlusion. Consider follow-up CTA Head with IV contrast to further evaluate. 2. No acute infarct or other acute intracranial abnormality. 3. Stable postoperative appearance of the brain, with chronic right superior frontal gyrus encephalomalacia, aside from mildly increased chronic extra-axial fluid volume since 2017 deep to the cranioplasty repair. Electronically Signed: By: Genevie Ann M.D. On:  03/01/2017 07:29    Microbiology: No results found for this or any previous visit (from the past 240 hour(s)).   Labs: Basic Metabolic Panel: Recent Labs  Lab 03/01/17 0225 03/01/17 0241  NA 140 142  K 4.1 4.0  CL 105 103  CO2 29  --   GLUCOSE 115* 115*  BUN 6 8  CREATININE 0.89 0.80  CALCIUM 8.8*  --    Liver Function Tests: Recent Labs  Lab 03/01/17 0225  AST 31  ALT 27  ALKPHOS 135*  BILITOT 0.5  PROT 6.9  ALBUMIN 3.7   No results for input(s): LIPASE, AMYLASE in the last 168 hours. No results for input(s): AMMONIA in the last 168 hours. CBC: Recent Labs  Lab 03/01/17 0225 03/01/17 0241  WBC 4.5  --   NEUTROABS 3.1  --   HGB 13.3 13.6  HCT 40.3 40.0  MCV 91.0  --   PLT 210  --    Cardiac Enzymes: No results for input(s): CKTOTAL, CKMB, CKMBINDEX, TROPONINI in the last 168 hours. BNP: BNP (last 3 results) No results for input(s): BNP in the last 8760 hours.  ProBNP (last 3 results) No results for input(s): PROBNP in the last 8760 hours.  CBG: Recent Labs  Lab 03/01/17 0301 03/01/17 0812 03/01/17 1232 03/01/17 1709  GLUCAP 107* 106* 93 106*       Signed:  Velvet Bathe MD.  [facility] 03/01/2017, 7:08 PM

## 2017-03-01 NOTE — H&P (Addendum)
History and Physical    Geoffrey Neal MEQ:[id number] DOB: 03-Mar-1946 DOA: 03/01/2017  PCP: Medicine, [facility]   Patient coming from: Home  Chief Complaint: Left-sided weakness   HPI: Geoffrey Neal is a 71 y.o. 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  Patient reports that he woke overnight with left-sided numbness and tingling, and noted that he was unsteady on his feet while walking.  EMS was called out and by the time of their arrival, only left facial numbness persisted.  There was no recent fall or trauma reported and no headache, fevers, or chills.    ED Course: Upon arrival to the ED, patient is found to be saturating well on room air, and with vitals otherwise stable.  EKG features a sinus rhythm and noncontrast head CT is negative for acute intracranial abnormality, but notable for postoperative findings.  Chemistry panel is unremarkable, CBC is normal, troponin is undetectable, and urinalysis is unremarkable.  Patient was treated with 324 mg aspirin and neurology was consulted by the ED physician.  Patient remained hemodynamically stable and in no apparent respiratory distress in the emergency department, and he will be observed on the telemetry unit for ongoing evaluation and management of left-sided numbness, suspected secondary to TIA/CVA.  Review of Systems:  All other systems reviewed and apart from HPI, are negative.  Past Medical History:  Diagnosis Date  . Blurred vision    "fluid in head"-   . Complication of anesthesia    " had difficulty waking up."  . Diabetes mellitus without complication ([place])    ?- patient states prediabetes  . DJD (degenerative joint disease)   . Elevated PSA   . Frequency of  urination   . GERD (gastroesophageal reflux disease)    occ  . History of kidney stones    yrs ago- patient denies- "it was an Infection".  . Hypothyroidism   . Mixed hyperlipidemia    MILD PER PT  . Nocturia   . Seizures ([place])    x 2 2016- "none since taking Gabapentin"    Past Surgical History:  Procedure Laterality Date  . COLONOSCOPY W/ POLYPECTOMY     x 2  . TONSILLECTOMY       reports that he quit smoking about 35 years ago. His smoking use included cigarettes. He quit after 15.00 years of use. he has never used smokeless tobacco. He reports that he does not drink alcohol or use drugs.  Allergies  Allergen Reactions  . Orange Fruit [Citrus] Rash    Family History  Problem Relation Age of Onset  . Diabetes Other      Prior to Admission medications   Medication Sig Start Date End Date Taking? Authorizing Provider  acetaminophen (TYLENOL) 325 MG tablet Take 1-2 tablets (325-650 mg total) by mouth every 4 (four) hours as needed for mild pain. Patient not taking: Reported on 09/29/2016 06/05/14   Angiulli, Lavon Paganini, PA-C  ferrous sulfate 325 (65 FE) MG tablet Take 325 mg by mouth daily. 07/17/16 07/17/17  [provider]  levETIRAcetam (KEPPRA) 500 MG tablet Take 1 tablet (500 mg total) by mouth 2 (two) times daily. 01/19/16   Rancour, Annie Main, MD  levothyroxine (SYNTHROID, LEVOTHROID) 175 MCG tablet Take 1 tablet (175 mcg total) by mouth daily before breakfast.  06/05/14   Angiulli, Lavon Paganini, PA-C  metFORMIN (GLUCOPHAGE) 500 MG tablet Take 500 mg by mouth 2 (two) times daily with a meal.    [provider]  mineral oil-hydrophilic petrolatum (AQUAPHOR) ointment Apply 1 application topically as needed for rash. 07/28/16   [provider]  ondansetron (ZOFRAN) 4 MG tablet Take 1 tablet (4 mg total) by mouth every 8 (eight) hours as needed for nausea or vomiting. 09/29/16   Clayton Bibles, PA-C  oxyCODONE-acetaminophen (PERCOCET/ROXICET) 5-325 MG tablet Take 1-2  tablets by mouth every 4 (four) hours as needed for severe pain. 09/29/16   Clayton Bibles, PA-C  topiramate (TOPAMAX) 25 MG tablet Take 1 tablet (25 mg total) by mouth at bedtime. Patient not taking: Reported on 01/19/2016 06/05/14   Cathlyn Parsons, PA-C    Physical Exam: Vitals:   03/01/17 0219 03/01/17 0228 03/01/17 0233 03/01/17 0415  BP:   (!) 142/95   Pulse:   76   Resp:   18   Temp:    98.2 F (36.8 C)  SpO2: 95%  95%   Weight:  76.7 kg (169 lb)    Height:  5\' 7"  (1.702 m)        Constitutional: NAD, calm, comfortable Eyes: PERTLA, lids and conjunctivae normal ENMT: Mucous membranes are moist. Posterior pharynx clear of any exudate or lesions.   Neck: normal, supple, no masses, no thyromegaly Respiratory: clear to auscultation bilaterally, no wheezing, no crackles. Normal respiratory effort.   Cardiovascular: S1 & S2 heard, regular rate and rhythm. No significant JVD. Abdomen: No distension, no tenderness, no masses palpated. Bowel sounds normal.  Musculoskeletal: no clubbing / cyanosis. No joint deformity upper and lower extremities.   Skin: Depigmentation of bilateral hands. Warm, dry, well-perfused. Neurologic: CN 2-12 grossly intact. Sensation intact, patellar DTR normal. Strength 5/5 in all 4 limbs.  Psychiatric: Alert and oriented x 3. Pleasant and cooperative.     Labs on Admission: I have personally reviewed following labs and imaging studies  CBC: Recent Labs  Lab 03/01/17 0225 03/01/17 0241  WBC 4.5  --   NEUTROABS 3.1  --   HGB 13.3 13.6  HCT 40.3 40.0  MCV 91.0  --   PLT 210  --    Basic Metabolic Panel: Recent Labs  Lab 03/01/17 0225 03/01/17 0241  NA 140 142  K 4.1 4.0  CL 105 103  CO2 29  --   GLUCOSE 115* 115*  BUN 6 8  CREATININE 0.89 0.80  CALCIUM 8.8*  --    GFR: Estimated Creatinine Clearance: 76.9 mL/min (by C-G formula based on SCr of 0.8 mg/dL). Liver Function Tests: Recent Labs  Lab 03/01/17 0225  AST 31  ALT 27    ALKPHOS 135*  BILITOT 0.5  PROT 6.9  ALBUMIN 3.7   No results for input(s): LIPASE, AMYLASE in the last 168 hours. No results for input(s): AMMONIA in the last 168 hours. Coagulation Profile: Recent Labs  Lab 03/01/17 0225  INR 1.04   Cardiac Enzymes: No results for input(s): CKTOTAL, CKMB, CKMBINDEX, TROPONINI in the last 168 hours. BNP (last 3 results) No results for input(s): PROBNP in the last 8760 hours. HbA1C: No results for input(s): HGBA1C in the last 72 hours. CBG: Recent Labs  Lab 03/01/17 0301  GLUCAP 107*   Lipid Profile: No results for input(s): CHOL, HDL, LDLCALC, TRIG, CHOLHDL, LDLDIRECT in the last 72 hours. Thyroid Function Tests: No results for input(s): TSH, T4TOTAL, FREET4, T3FREE, THYROIDAB in the  last 72 hours. Anemia Panel: No results for input(s): VITAMINB12, FOLATE, FERRITIN, TIBC, IRON, RETICCTPCT in the last 72 hours. Urine analysis:    Component Value Date/Time   COLORURINE STRAW (A) 03/01/2017 0330   APPEARANCEUR CLEAR 03/01/2017 0330   LABSPEC 1.008 03/01/2017 0330   PHURINE 7.0 03/01/2017 0330   GLUCOSEU NEGATIVE 03/01/2017 0330   HGBUR NEGATIVE 03/01/2017 0330   BILIRUBINUR NEGATIVE 03/01/2017 0330   KETONESUR NEGATIVE 03/01/2017 0330   PROTEINUR NEGATIVE 03/01/2017 0330   UROBILINOGEN 1.0 05/28/2014 2349   NITRITE NEGATIVE 03/01/2017 0330   LEUKOCYTESUR NEGATIVE 03/01/2017 0330   Sepsis Labs: @LABRCNTIP (procalcitonin:4,lacticidven:4) )No results found for this or any previous visit (from the past 240 hour(s)).   Radiological Exams on Admission: Ct Head Wo Contrast  Result Date: 03/01/2017 CLINICAL DATA:  Transient ischemic attack EXAM: CT HEAD WITHOUT CONTRAST TECHNIQUE: Contiguous axial images were obtained from the base of the skull through the vertex without intravenous contrast. COMPARISON:  Head CT 01/19/2016 FINDINGS: Brain: No acute hemorrhage or evidence acute infarct. Small right frontal convexity extra-axial collection  underlying craniectomy site shows no evidence of acute abnormality. There is superior right frontal encephalomalacia at the resection site. Brain parenchyma is generally otherwise normal for age. Vascular: No hyperdense vessel or unexpected calcification. Skull: Status post bilateral frontal and partial right parietal craniectomy. Sinuses/Orbits: No sinus fluid levels or advanced mucosal thickening. No mastoid effusion. Normal orbits. IMPRESSION: 1. No acute intracranial abnormality. 2. Postoperative findings of remote right frontal lobe lesion resection and bifrontal craniectomy. 3. Small right frontal convexity extra-axial collection is likely postsurgical in shows no evidence of acute hemorrhage. Electronically Signed   By: Ulyses Jarred M.D.   On: 03/01/2017 03:34    EKG: Independently reviewed. Sinus rhythm.   Assessment/Plan  1. TIA  - Pt presents with left-sided numbness, resolving by time of presentation  - Head CT negative for acute abnormality, but notable for postoperative changes  - Neurology is consulting and much appreciated  - Plan to continue cardiac monitoring, frequent neuro checks, PT/OT/SLP evals, MRI brain, MRA head, carotid dopplers, echo, fasting lipids, and A1c; start full-dose ASA, manage risk-factors   2. Cancer of common bile duct  - Pt follows with [facility] oncology, is status-post Whipple procedure, and now under treatment with radiation and chemotherapy  - Continue oncology follow-up    3. Hypothyroidism  - Continue Synthroid    4. Type II DM  - A1c was 6.9% a year ago  - Managed with metformin at home, held on admission  - Check CBG's and start a low-intensity SSI with Novolog    DVT prophylaxis: Lovenox Code Status: Full  Family Communication: Discussed with patient Disposition Plan: Observe on telemetry Consults called: Neurology Admission status: Observation    Vianne Bulls, MD [facility] Pager [phone]  If 7PM-7AM, please contact  night-coverage [email or web address] Password [email or web address]  03/01/2017, 5:20 AM

## 2017-05-24 ENCOUNTER — Ambulatory Visit: Payer: Medicare Other | Admitting: Neurology

## 2018-01-20 ENCOUNTER — Other Ambulatory Visit: Payer: Self-pay

## 2018-01-20 ENCOUNTER — Emergency Department (HOSPITAL_COMMUNITY): Payer: Medicare Other

## 2018-01-20 ENCOUNTER — Encounter (HOSPITAL_COMMUNITY): Payer: Self-pay

## 2018-01-20 ENCOUNTER — Inpatient Hospital Stay (HOSPITAL_COMMUNITY)
Admission: EM | Admit: 2018-01-20 | Discharge: 2018-02-22 | DRG: 374 | Disposition: E | Payer: Medicare Other | Attending: Internal Medicine | Admitting: Internal Medicine

## 2018-01-20 DIAGNOSIS — Y92238 Other place in hospital as the place of occurrence of the external cause: Secondary | ICD-10-CM | POA: Diagnosis not present

## 2018-01-20 DIAGNOSIS — Z7189 Other specified counseling: Secondary | ICD-10-CM

## 2018-01-20 DIAGNOSIS — K921 Melena: Secondary | ICD-10-CM | POA: Diagnosis present

## 2018-01-20 DIAGNOSIS — I609 Nontraumatic subarachnoid hemorrhage, unspecified: Secondary | ICD-10-CM | POA: Diagnosis not present

## 2018-01-20 DIAGNOSIS — D62 Acute posthemorrhagic anemia: Secondary | ICD-10-CM

## 2018-01-20 DIAGNOSIS — R9431 Abnormal electrocardiogram [ECG] [EKG]: Secondary | ICD-10-CM | POA: Diagnosis present

## 2018-01-20 DIAGNOSIS — Z66 Do not resuscitate: Secondary | ICD-10-CM | POA: Diagnosis not present

## 2018-01-20 DIAGNOSIS — E876 Hypokalemia: Secondary | ICD-10-CM | POA: Diagnosis not present

## 2018-01-20 DIAGNOSIS — Z86011 Personal history of benign neoplasm of the brain: Secondary | ICD-10-CM | POA: Diagnosis not present

## 2018-01-20 DIAGNOSIS — S020XXA Fracture of vault of skull, initial encounter for closed fracture: Secondary | ICD-10-CM

## 2018-01-20 DIAGNOSIS — R180 Malignant ascites: Secondary | ICD-10-CM | POA: Diagnosis present

## 2018-01-20 DIAGNOSIS — Z87898 Personal history of other specified conditions: Secondary | ICD-10-CM

## 2018-01-20 DIAGNOSIS — K7200 Acute and subacute hepatic failure without coma: Secondary | ICD-10-CM | POA: Diagnosis present

## 2018-01-20 DIAGNOSIS — Z87891 Personal history of nicotine dependence: Secondary | ICD-10-CM | POA: Diagnosis not present

## 2018-01-20 DIAGNOSIS — K922 Gastrointestinal hemorrhage, unspecified: Secondary | ICD-10-CM | POA: Diagnosis not present

## 2018-01-20 DIAGNOSIS — C259 Malignant neoplasm of pancreas, unspecified: Secondary | ICD-10-CM

## 2018-01-20 DIAGNOSIS — C240 Malignant neoplasm of extrahepatic bile duct: Secondary | ICD-10-CM | POA: Diagnosis present

## 2018-01-20 DIAGNOSIS — R0902 Hypoxemia: Secondary | ICD-10-CM | POA: Diagnosis not present

## 2018-01-20 DIAGNOSIS — J9601 Acute respiratory failure with hypoxia: Secondary | ICD-10-CM | POA: Diagnosis present

## 2018-01-20 DIAGNOSIS — C7889 Secondary malignant neoplasm of other digestive organs: Principal | ICD-10-CM | POA: Diagnosis present

## 2018-01-20 DIAGNOSIS — W19XXXA Unspecified fall, initial encounter: Secondary | ICD-10-CM | POA: Diagnosis not present

## 2018-01-20 DIAGNOSIS — S066X9A Traumatic subarachnoid hemorrhage with loss of consciousness of unspecified duration, initial encounter: Secondary | ICD-10-CM | POA: Diagnosis not present

## 2018-01-20 DIAGNOSIS — E782 Mixed hyperlipidemia: Secondary | ICD-10-CM | POA: Diagnosis present

## 2018-01-20 DIAGNOSIS — W1839XA Other fall on same level, initial encounter: Secondary | ICD-10-CM | POA: Diagnosis not present

## 2018-01-20 DIAGNOSIS — E162 Hypoglycemia, unspecified: Secondary | ICD-10-CM | POA: Diagnosis not present

## 2018-01-20 DIAGNOSIS — K920 Hematemesis: Secondary | ICD-10-CM | POA: Diagnosis present

## 2018-01-20 DIAGNOSIS — C786 Secondary malignant neoplasm of retroperitoneum and peritoneum: Secondary | ICD-10-CM | POA: Diagnosis present

## 2018-01-20 DIAGNOSIS — Z90411 Acquired partial absence of pancreas: Secondary | ICD-10-CM | POA: Diagnosis not present

## 2018-01-20 DIAGNOSIS — D696 Thrombocytopenia, unspecified: Secondary | ICD-10-CM

## 2018-01-20 DIAGNOSIS — K311 Adult hypertrophic pyloric stenosis: Secondary | ICD-10-CM | POA: Diagnosis not present

## 2018-01-20 DIAGNOSIS — E039 Hypothyroidism, unspecified: Secondary | ICD-10-CM | POA: Diagnosis not present

## 2018-01-20 DIAGNOSIS — R9401 Abnormal electroencephalogram [EEG]: Secondary | ICD-10-CM | POA: Diagnosis present

## 2018-01-20 DIAGNOSIS — S065X9A Traumatic subdural hemorrhage with loss of consciousness of unspecified duration, initial encounter: Secondary | ICD-10-CM | POA: Diagnosis not present

## 2018-01-20 DIAGNOSIS — K7682 Hepatic encephalopathy: Secondary | ICD-10-CM

## 2018-01-20 DIAGNOSIS — Z515 Encounter for palliative care: Secondary | ICD-10-CM

## 2018-01-20 DIAGNOSIS — D500 Iron deficiency anemia secondary to blood loss (chronic): Secondary | ICD-10-CM | POA: Diagnosis not present

## 2018-01-20 DIAGNOSIS — I951 Orthostatic hypotension: Secondary | ICD-10-CM

## 2018-01-20 HISTORY — DX: Personal history of other specified conditions: Z87.898

## 2018-01-20 LAB — CBC
HEMATOCRIT: 26.6 % — AB (ref 39.0–52.0)
HEMOGLOBIN: 7.7 g/dL — AB (ref 13.0–17.0)
MCH: 36 pg — ABNORMAL HIGH (ref 26.0–34.0)
MCHC: 28.9 g/dL — ABNORMAL LOW (ref 30.0–36.0)
MCV: 124.3 fL — AB (ref 78.0–100.0)
Platelets: 126 10*3/uL — ABNORMAL LOW (ref 150–400)
RBC: 2.14 MIL/uL — AB (ref 4.22–5.81)
RDW: 22.4 % — AB (ref 11.5–15.5)
WBC: 7.8 10*3/uL (ref 4.0–10.5)

## 2018-01-20 LAB — COMPREHENSIVE METABOLIC PANEL
ALT: 110 U/L — AB (ref 0–44)
ANION GAP: 10 (ref 5–15)
AST: 204 U/L — ABNORMAL HIGH (ref 15–41)
Albumin: 2.4 g/dL — ABNORMAL LOW (ref 3.5–5.0)
Alkaline Phosphatase: 1953 U/L — ABNORMAL HIGH (ref 38–126)
BUN: 7 mg/dL — ABNORMAL LOW (ref 8–23)
CHLORIDE: 100 mmol/L (ref 98–111)
CO2: 27 mmol/L (ref 22–32)
Calcium: 7.8 mg/dL — ABNORMAL LOW (ref 8.9–10.3)
Creatinine, Ser: 0.73 mg/dL (ref 0.61–1.24)
GFR calc non Af Amer: >60 mL/min (ref >60)
Glucose, Bld: 110 mg/dL — ABNORMAL HIGH (ref 70–99)
POTASSIUM: 3.5 mmol/L (ref 3.5–5.1)
SODIUM: 137 mmol/L (ref 135–145)
Total Bilirubin: 4.2 mg/dL — ABNORMAL HIGH (ref 0.3–1.2)
Total Protein: 6.3 g/dL — ABNORMAL LOW (ref 6.5–8.1)

## 2018-01-20 LAB — TSH: TSH: 22.806 u[IU]/mL — AB (ref 0.350–4.500)

## 2018-01-20 LAB — CBG MONITORING, ED: Glucose-Capillary: 118 mg/dL — ABNORMAL HIGH (ref 70–99)

## 2018-01-20 LAB — OCCULT BLOOD X 1 CARD TO LAB, STOOL: FECAL OCCULT BLD: POSITIVE — AB

## 2018-01-20 LAB — AMMONIA: Ammonia: 87 umol/L — ABNORMAL HIGH (ref 9–35)

## 2018-01-20 LAB — PROTIME-INR
INR: 1.12
PROTHROMBIN TIME: 14.3 s (ref 11.4–15.2)

## 2018-01-20 MED ORDER — SODIUM CHLORIDE 0.9 % IV BOLUS
1000.0000 mL | Freq: Once | INTRAVENOUS | Status: AC
  Administered 2018-01-20: 1000 mL via INTRAVENOUS

## 2018-01-20 MED ORDER — SODIUM CHLORIDE 0.9% IV SOLUTION: Freq: Once | INTRAVENOUS | Status: DC

## 2018-01-20 MED ORDER — SODIUM CHLORIDE 0.9% FLUSH
3.0000 mL | Freq: Two times a day (BID) | INTRAVENOUS | Status: DC
  Administered 2018-01-25: 3 mL via INTRAVENOUS

## 2018-01-20 MED ORDER — LEVOTHYROXINE SODIUM 100 MCG IV SOLR
75.0000 ug | Freq: Every day | INTRAVENOUS | Status: DC
  Administered 2018-01-21 – 2018-01-22 (×2): 75 ug via INTRAVENOUS
  Filled 2018-01-20 (×3): qty 5

## 2018-01-20 MED ORDER — SODIUM CHLORIDE 0.9 % IV SOLN
80.0000 mg | Freq: Once | INTRAVENOUS | Status: AC
  Administered 2018-01-20: 80 mg via INTRAVENOUS
  Filled 2018-01-20: qty 80

## 2018-01-20 MED ORDER — LACTATED RINGERS IV SOLN
INTRAVENOUS | Status: DC
  Administered 2018-01-21: 08:00:00 via INTRAVENOUS

## 2018-01-20 MED ORDER — ONDANSETRON HCL 4 MG/2ML IJ SOLN: 4.0000 mg | Freq: Four times a day (QID) | INTRAMUSCULAR | Status: DC | PRN

## 2018-01-20 MED ORDER — LORAZEPAM 2 MG/ML IJ SOLN
1.0000 mg | Freq: Once | INTRAMUSCULAR | Status: DC
  Filled 2018-01-20: qty 1

## 2018-01-20 MED ORDER — SODIUM CHLORIDE 0.9 % IV SOLN: Freq: Once | INTRAVENOUS | Status: DC

## 2018-01-20 MED ORDER — MORPHINE SULFATE (PF) 2 MG/ML IV SOLN: 2.0000 mg | INTRAVENOUS | Status: DC | PRN

## 2018-01-20 MED ORDER — SODIUM CHLORIDE 0.9 % IV SOLN
8.0000 mg/h | INTRAVENOUS | Status: DC
  Administered 2018-01-20: 8 mg/h via INTRAVENOUS
  Filled 2018-01-20 (×5): qty 80

## 2018-01-20 NOTE — H&P (Signed)
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  He presents after just coming back to the [place] yesterday with a constellation of symptoms including fatigue, weakness, nausea, vomiting, coffee-ground emesis, declining functional status.  History is obtained via the use of a Spanish interpreter remotely, Geoffrey Neal, [place] number [phone].  Of note, the patient's daughter is there as well, Geoffrey Neal.  Primary medical oncologist Dr. Georgiann Cocker, of [facility].  A Port-A-Cath was placed in anticipation for systemic adjuvant therapy, however the patient ultimately was concerned about toxicity associated with systemic therapy and had the Port-A-Cath removed.  He denies any gross bleeding, reports weight loss as well as symptoms mentioned above.  Has not required a blood transfusion.  Reports that alternative treatments received in [place] include mistletoe.  He reports he does understand that his oncologic diagnosis is terminal will be the cause of his death.  He does report that if anticancer therapy were offered, he would be interested in taking therapy and a palliative intent.  Other details include, he lives with his spouse as well as his daughter and her family.  Former smoker, other comorbid's include hypothyroidism as well as brain lesion removal in the past.  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  ED Course: In the emergency room vital signs remarkable for heart rate of 100 and normalized prior to admission, blood pressure with systolic ranging from 952 - 113.  Labs reveal hemoglobin  7.7, macrocytic with MCV of 124, platelet count 120.  CMP with gross derangements of hepatic function including, alk phos of 1900, total bilirubin 4.2, AST 204, ALT 110, albumin 2.4.  Creatinine normal.  Chest x-ray without acute findings.  Emergency medicine team reports to perform rectal exam with melena.  Due to concern about acute GI bleed, given IV PPI, gastroenterology consulted for consideration of endoscopic evaluation-see report they will evaluate the patient in a.m.  Hospital medicine consulted for further management.  Review of Systems: A complete ROS was obtained; pertinent positives negatives are denoted in the HPI. Otherwise, all systems are negative.   Past Medical History:  Diagnosis Date  . Blurred vision    "fluid in head"-   . Complication of anesthesia    " had difficulty waking up."  . Diabetes mellitus without complication ([place])    ?- patient states prediabetes  . DJD (degenerative joint disease)   . Elevated PSA   . Frequency of urination   . GERD (gastroesophageal reflux disease)    occ  . History of kidney stones    yrs ago- patient denies- "it was an Infection".  . Hypothyroidism   . Mixed hyperlipidemia    MILD PER PT  . Nocturia   . Seizures ([place])    x 2 2016- "none since taking Gabapentin"   Social History   Socioeconomic History  . Marital status: Married    Spouse name: Not on file  . Number of children: Not on file  . Years of education: Not on file  . Highest education level: Not on file  Occupational History  . Not on file  Social Needs  . Financial resource strain: Not  on file  . Food insecurity:    Worry: Not on file    Inability: Not on file  . Transportation needs:    Medical: Not on file    Non-medical: Not on file  Tobacco Use  . Smoking status: Former Smoker    Years: 15.00    Types: Cigarettes    Last attempt to quit: 05/05/1981    Years since quitting: 36.7  . Smokeless tobacco: Never Used  . Tobacco comment: QUIT SMOKING 34  YRS AGO--  APPROX. 1980'S  Substance and Sexual Activity  . Alcohol use: No    Comment: quit 34 yrs ago  . Drug use: No  . Sexual activity: Not on file  Lifestyle  . Physical activity:    Days per week: Not on file    Minutes per session: Not on file  . Stress: Not on file  Relationships  . Social connections:    Talks on phone: Not on file    Gets together: Not on file    Attends religious service: Not on file    Active member of club or organization: Not on file    Attends meetings of clubs or organizations: Not on file    Relationship status: Not on file  . Intimate partner violence:    Fear of current or ex partner: Not on file    Emotionally abused: Not on file    Physically abused: Not on file    Forced sexual activity: Not on file  Other Topics Concern  . Not on file  Social History Narrative  . Not on file   Family History  Problem Relation Age of Onset  . Diabetes Other     Physical Exam: Vitals:   01/16/2018 1732 01/13/2018 1859 01/19/2018 2047  BP: 102/76 101/70 113/81  Pulse: 100 99 81  Resp: '14 14 17  '$ Temp: 99.1 F (37.3 C) 98.8 F (37.1 C)   TempSrc: Oral Oral   SpO2: 98% 95% 95%   General: Chronically ill appearing [place] man, tired ENT: Scleral icterus, temporal muscle wasting present Cardiovascular: RRR. No M/R/G. 2+ b/l lower extremity edema Respiratory: CTA bilaterally anteriorly. No wheezes or crackles. Normal respiratory effort. Abdomen: Soft, non-tender. Well healed midline scar. Skin: No rash or induration seen on limited exam other than hypo-pigmentation bilateral hands. Musculoskeletal: Grossly reduced muscle mass Psychiatric: Grossly normal mood and affect. Neurologic: Moves all extremities in coordinated fashion, follow command, not oriented to year (2015), answers other questions appropriately, no asterixis  I have personally reviewed the following labs, culture data, and imaging studies.  Assessment/Plan:  #Upper GI bleed, concern  for Course: on admission, report of melena and coffee ground emesis in the setting of metastatic common bile duct cancer A/P: uncertain tempo of symptoms, will continue PPI therapy, GI consult for endoscopic evaluation (EGD), type and screen, NPO, will transfuse if hb < 7, repeat CBC in 6 hours  #Common bile duct cancer Course: reviewed primary medical oncologist note from 07/2017, underwent Whipple in 12/2016, treated with adjuvant chemo-RT, he declined adjuvant systemic therapy due to concern for toxicity. Went to [place] for alternative treatment ([place]to[place]), but due to lack of improvement, progressive symptoms and his daughter' urging, came back on 01/19/2009 to [place] in hopes of 2nd opinion at [facility].   Report of duodenal stent in 07/2017. A/P: discussed case extensively with daughter and patient, ECOG PS of 3. Will get CT of abdomen and pelvis to further evaluate burden of disease. Believe that underlying malignancy  may be contributing to GI tract obstruction that may be able to be more fully evaluated by EGD as well.  Consider oncology consultation in the AM or later in this admission if needed to discuss further with patient and family once aforementioned diagnostics are known. CT scan may shed light on if any further palliative interventions can be initiated.  Discussed hospice with the daughter.  Largest symptoms appear nausea, vomiting related - will have as needed nausea (ondansetron), pain (IV morphine) available. Will provide IVF support overnight with LR at 100 cc x 10 hrs then re-assess.  #Other problems: -Cholestatic liver injury: elevated AP and T bili, likely related to biliary cancer, trend, CT of abdomen/pelvis to evaluate -Anemia, macrocytic: TSH, B12, iron profile, possibly with component of blood loss -Hypothryoidism:  TSH, transition to IV formulation given inability to take PO  DVT prophylaxis: SCD given concern for bleeding from  GI tract  Code Status: DNR / DNI based on  conversations with the patient and family on admission Disposition Plan: TBD Consults called:  GI consulted by ED team, consider oncology consult pending clinical course Admission status: Admit to hospital medicine, floor level of care   Cheri Rous, MD [facility] Page:[phone]  If 7PM-7AM, please contact night-coverage [email or web address] Password [email or web address]  This document was created using the aid of voice recognition / dication software.

## 2018-01-20 NOTE — ED Notes (Signed)
Placed condom catheter on pt for accurate I&o

## 2018-01-20 NOTE — ED Notes (Signed)
ED Provider at bedside. 

## 2018-01-20 NOTE — ED Triage Notes (Signed)
Pt from home for vomiting dark brown substance for last week.  Patient is lethargic. Pt traveled from [place] 2 days ago.  Cancer patient and receiving treatment at [place]. Alert to questions but only able to nod yes and no.

## 2018-01-20 NOTE — ED Notes (Signed)
Another RN attempted x 2 for IV with no success

## 2018-01-20 NOTE — ED Notes (Signed)
IV team at bedside 

## 2018-01-20 NOTE — Progress Notes (Signed)
Responded to PIV consult. MD in room.

## 2018-01-20 NOTE — ED Notes (Signed)
Pt had witnessed fall by daughter. Daughter assisted pt to standing position to use the urinal. Daughter stepped outside of room and came back in pt fell backwards hitting his head on ground. Pt has small abrasion to posterior head. No other injuries noted, see new orders for CT. RN informed daughter and pt to use the call light for assistance before getting up, call light was in reach at the time of the fall. Placed yellow socks and armband on patient. Will continue to monitor.

## 2018-01-20 NOTE — ED Notes (Signed)
Patient transported to X-ray 

## 2018-01-20 NOTE — ED Provider Notes (Addendum)
[facility] EMERGENCY DEPARTMENT Provider Note   CSN: [id number] Arrival date & time: 01/09/2018  1649     History   Chief Complaint Chief Complaint  Patient presents with  . Cough  . Emesis  . Cancer    HPI Geoffrey Neal is a 72 y.o. male.  HPI  Patient is a 72 year old male with a history of metastatic pancreatic cancer s/p open pylorus-preserving pancreaticoduodenectomy (01/04/2017) with vascular resection and recontruction, radiation, chemotherapy, and hypothyroidism (self discontinued levothyroxine) presenting for generalized weakness and vomiting dark brown stomach contents.  Patient presents with his daughter who assist in history taking, and assists as interpreter.  Per patient's daughter, she picked up her father and her mother from the airport yesterday after a 2-week trip to [place] where patient was receiving alternative therapies including mistletoe injections, and intralesional injections of mistletoe through endoscopy.  Patient had multiple visits to the emergency department in [place] for anemia and hypokalemia per his daughter.  Patient denies any fevers, chills, chest pain.  Patient does report he is weak and short of breath when he exerts himself.  Patient reports that he noticed dark tarry stools 2 days ago, but is not having any active diarrhea.  Patient reports that when he feels the need to vomit, he is producing clear sputum, but only had a couple episodes of the dark emesis today.  Patient denying taking any other home medications, over-the-counter medications.  Past Medical History:  Diagnosis Date  . Blurred vision    "fluid in head"-   . Complication of anesthesia    " had difficulty waking up."  . Diabetes mellitus without complication ([place])    ?- patient states prediabetes  . DJD (degenerative joint disease)   . Elevated PSA   . Frequency of urination   . GERD (gastroesophageal reflux disease)    occ  . History of kidney stones    yrs ago-  patient denies- "it was an Infection".  . Hypothyroidism   . Mixed hyperlipidemia    MILD PER PT  . Nocturia   . Seizures ([place])    x 2 2016- "none since taking Gabapentin"    Patient Active Problem List   Diagnosis Date Noted  . 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 TIA (transient ischemic attack) 03/01/2017  . History of seizure   . S/P resection of meningioma   . 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 Paraparesis ([place]) 05/30/2014  . Meningioma ([place])   . Brain tumor ([place]) 05/07/2014  . CONSTIPATION 11/16/2009  . 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 HYPERGLYCEMIA 09/29/2008  . 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 GERD 02/18/2007  . VITILIGO 02/11/2007  . Hypothyroidism 02/04/2007  . DEGENERATIVE JOINT DISEASE 02/04/2007  . ARTHRALGIA 02/04/2007    Past Surgical History:  Procedure Laterality Date  . COLONOSCOPY W/ POLYPECTOMY     x 2  . CRANIOPLASTY N/A 05/22/2014   Procedure: CRANIOPLASTY;  Surgeon: Ashok Pall, MD;  Location: [facility];  Service: Neurosurgery;  Laterality: N/A;  Cranioplasty  . CRANIOTOMY Bilateral 05/08/2014   Procedure: BILATERAL FRONTAL PARIETAL CRANIOTOMY TUMOR EXCISION;  Surgeon: Ashok Pall, MD;  Location: [facility];  Service: Neurosurgery;  Laterality: Bilateral;  . CRANIOTOMY N/A 01/12/2016   Procedure: CRANIOTOMY FOR  CEREBRO SPINAL FLUID LEAK;  Surgeon: Ashok Pall, MD;  Location: [facility];  Service: Neurosurgery;  Laterality: N/A;  . HEMATOMA EVACUATION N/A 12/31/2015   Procedure: ASPIRATION OF SUBCUTANEOUS FLUID COLLECTION of Cranial Defect;  Surgeon: Marylyn Ishihara  Christella Noa, MD;  Location: [facility];  Service: Neurosurgery;  Laterality: N/A;  . PLACEMENT OF LUMBAR DRAIN  01/12/2016   Procedure: PLACEMENT OF LUMBAR DRAIN;  Surgeon: Ashok Pall, MD;  Location: [facility];  Service: Neurosurgery;;  . PROSTATE BIOPSY N/A 07/19/2012   Procedure: EUA, PROSTATE BIOPSY TRANSRECTAL ULTRASONIC PROSTATE (TUBP);  Surgeon: Fredricka Bonine, MD;  Location:  [facility];  Service: Urology;  Laterality: N/A;  . RADIOLOGY WITH ANESTHESIA N/A 05/07/2014   Procedure: RADIOLOGY WITH ANESTHESIA/EMBOLIZATION;  Surgeon: Rob Hickman, MD;  Location: [facility];  Service: Radiology;  Laterality: N/A;  . TONSILLECTOMY          Home Medications    Prior to Admission medications   Medication Sig Start Date End Date Taking? Authorizing Provider  acetaminophen (TYLENOL) 325 MG tablet Take 1-2 tablets (325-650 mg total) by mouth every 4 (four) hours as needed for mild pain. 06/05/14   Angiulli, Lavon Paganini, PA-C  aspirin 325 MG tablet Take 1 tablet (325 mg total) daily by mouth. 03/02/17   Velvet Bathe, MD  atorvastatin (LIPITOR) 20 MG tablet Take 1 tablet (20 mg total) daily at 6 PM by mouth. 03/01/17   Velvet Bathe, MD  capecitabine (XELODA) 500 MG tablet Take 1,500 mg 2 (two) times daily after a meal by mouth.    [provider]  ferrous sulfate 325 (65 FE) MG tablet Take 325 mg by mouth daily. 07/17/16 07/17/17  [provider]  levothyroxine (SYNTHROID, LEVOTHROID) 175 MCG tablet Take 1 tablet (175 mcg total) by mouth daily before breakfast. 06/05/14   Angiulli, Lavon Paganini, PA-C  Multiple Vitamin (MULTIVITAMIN WITH MINERALS) TABS tablet Take 1 tablet daily by mouth.    [provider]  ondansetron (ZOFRAN) 4 MG tablet Take 1 tablet (4 mg total) by mouth every 8 (eight) hours as needed for nausea or vomiting. 09/29/16   Clayton Bibles, PA-C  oxyCODONE-acetaminophen (PERCOCET/ROXICET) 5-325 MG tablet Take 1-2 tablets by mouth every 4 (four) hours as needed for severe pain. 09/29/16   Clayton Bibles, PA-C    Family History Family History  Problem Relation Age of Onset  . Diabetes Other     Social History Social History   Tobacco Use  . Smoking status: Former Smoker    Years: 15.00    Types: Cigarettes    Last attempt to quit: 05/05/1981    Years since quitting: 36.7  . Smokeless tobacco: Never Used  . Tobacco comment:  QUIT SMOKING 34 YRS AGO--  APPROX. 1980'S  Substance Use Topics  . Alcohol use: No    Comment: quit 34 yrs ago  . Drug use: No     Allergies   Orange fruit [citrus]   Review of Systems Review of Systems  Constitutional: Negative for chills and fever.  HENT: Negative for congestion.   Eyes: Negative for visual disturbance.  Respiratory: Positive for shortness of breath. Negative for cough and chest tightness.   Cardiovascular: Negative for chest pain, palpitations and leg swelling.  Gastrointestinal: Positive for nausea and vomiting. Negative for abdominal pain and blood in stool.  Genitourinary: Negative for dysuria and flank pain.  Musculoskeletal: Negative for back pain and myalgias.  Skin: Negative for rash.  Neurological: Positive for weakness. Negative for dizziness and syncope.       +Generalized weakness     Physical Exam Updated Vital Signs BP 101/70 (BP Location: Left Arm)   Pulse 99   Temp 98.8 F (37.1 C) (Oral)  Resp 14   SpO2 95%   Physical Exam  Constitutional: He is oriented to person, place, and time.  Thin, cachectic, and chronically ill-appearing.  HENT:  Head: Normocephalic and atraumatic.  Mouth/Throat: Oropharynx is clear and moist.  Eyes: Pupils are equal, round, and reactive to light. EOM are normal. Scleral icterus is present.  Neck: Normal range of motion. Neck supple.  Cardiovascular: Normal rate, regular rhythm, S1 normal and S2 normal.  No murmur heard. Pulmonary/Chest: Effort normal and breath sounds normal. He has no wheezes. He has no rales.  Abdominal: Soft. He exhibits distension. There is no tenderness. There is no guarding.  Mild distention of the abdomen.  No tenderness to palpation.  Genitourinary: Rectal exam shows guaiac positive stool.  Genitourinary Comments: Rectal examination performed with nurse tech chaperone present.  Patient has melanotic stool on exam.  Normal rectal tone.  Musculoskeletal: Normal range of motion. He  exhibits edema. He exhibits no deformity.  Patient has symmetric, pitting bilateral lower extremity edema.  Neurological: He is alert and oriented to person, place, and time.  Cranial nerves grossly intact. Patient moves extremities symmetrically and with good coordination.  Skin: Skin is warm and dry. No rash noted. No erythema.  Psychiatric: He has a normal mood and affect. His behavior is normal. Judgment and thought content normal.  Nursing note and vitals reviewed.    ED Treatments / Results  Labs (all labs ordered are listed, but only abnormal results are displayed) Labs Reviewed  COMPREHENSIVE METABOLIC PANEL - Abnormal; Notable for the following components:      Result Value   Glucose, Bld 110 (*)    BUN 7 (*)    Calcium 7.8 (*)    Total Protein 6.3 (*)    Albumin 2.4 (*)    AST 204 (*)    ALT 110 (*)    Alkaline Phosphatase 1,953 (*)    Total Bilirubin 4.2 (*)    All other components within normal limits  CBC - Abnormal; Notable for the following components:   RBC 2.14 (*)    Hemoglobin 7.7 (*)    HCT 26.6 (*)    MCV 124.3 (*)    MCH 36.0 (*)    MCHC 28.9 (*)    RDW 22.4 (*)    Platelets 126 (*)    All other components within normal limits  AMMONIA - Abnormal; Notable for the following components:   Ammonia 87 (*)    All other components within normal limits  OCCULT BLOOD X 1 CARD TO LAB, STOOL - Abnormal; Notable for the following components:   Fecal Occult Bld POSITIVE (*)    All other components within normal limits  TSH - Abnormal; Notable for the following components:   TSH 22.806 (*)    All other components within normal limits  CBG MONITORING, ED - Abnormal; Notable for the following components:   Glucose-Capillary 118 (*)    All other components within normal limits  PROTIME-INR  URINALYSIS, ROUTINE W REFLEX MICROSCOPIC  TSH  COMPREHENSIVE METABOLIC PANEL  CBC WITH DIFFERENTIAL/PLATELET  IRON AND TIBC  FERRITIN  VITAMIN B12  CBC  POC OCCULT  BLOOD, ED  TYPE AND SCREEN  PREPARE RBC (CROSSMATCH)    EKG None  Radiology Dg Chest 2 View  Result Date: 01/01/2018 CLINICAL DATA:  Weakness.  Vomiting.  Lethargy. EXAM: CHEST - 2 VIEW COMPARISON:  Radiographs 01/19/2016 FINDINGS: Lung volumes are low. The cardiomediastinal contours are normal. No evidence of pneumomediastinum. The lungs are  clear. Pulmonary vasculature is normal. No consolidation, pleural effusion, or pneumothorax. No acute osseous abnormalities are seen. IMPRESSION: Low lung volumes without acute chest finding. Electronically Signed   By: Keith Rake M.D.   On: 01/02/2018 21:02   Ct Head Wo Contrast  Result Date: 01/21/2018 CLINICAL DATA:  Witnessed fall. Struck posterior head. History of seizures and resected meningioma. EXAM: CT HEAD WITHOUT CONTRAST TECHNIQUE: Contiguous axial images were obtained from the base of the skull through the vertex without intravenous contrast. COMPARISON:  MRI head March 01, 2017 and CT HEAD March 01, 2017. FINDINGS: BRAIN: Small volume RIGHT frontotemporal subarachnoid hemorrhage. RIGHT mesial frontal lobe encephalomalacia with acute hemorrhage which may be intraparenchymal or extra-axial. Trace LEFT parietal extra-axial hemorrhage. No midline shift, mass effect or acute large vascular territory infarcts. Stable low-density convexity fluid collection with trace acute blood products. Basal cisterns are patent. VASCULAR: Moderate calcific atherosclerosis of the carotid siphons. SKULL: Acute nondisplaced LEFT parietal skull fracture. 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 Moderate LEFT parietal scalp hematoma. Moderate temporomandibular osteoarthrosis. SINUSES/ORBITS: Mild paranasal sinus mucosal thickening. Mastoid air cells are well aerated.The included ocular globes and orbital contents are non-suspicious. OTHER: None. IMPRESSION: 1. Acute nondisplaced LEFT parietal skull fracture. Trace subjacent  epidural versus subdural hematoma. Small volume RIGHT frontotemporal subarachnoid hemorrhage. 2. RIGHT frontal intraparenchymal versus extra-axial blood products superimposed on RIGHT frontal encephalomalacia. 3. 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 4. Critical Value/emergent results were called by telephone at the time of interpretation on 01/21/2018 at 3:66 am to [place] , who verbally acknowledged these results. Electronically Signed   By: Elon Alas M.D.   On: 01/21/2018 01:29   Ct Abdomen Pelvis W Contrast  Result Date: 01/21/2018 CLINICAL DATA:  Metastatic pancreatic cancer, status post pylorus sparing pancreaticoduodenectomy, radiation, and chemotherapy, with weakness and vomiting. EXAM: CT ABDOMEN AND PELVIS WITH CONTRAST TECHNIQUE: Multidetector CT imaging of the abdomen and pelvis was performed using the standard protocol following bolus administration of intravenous contrast. CONTRAST:  57mL OMNIPAQUE IOHEXOL 300 MG/ML  SOLN COMPARISON:  09/29/2016. FINDINGS: Lower chest: Linear scarring in the right lower lobe. Hepatobiliary: No suspicious hepatic lesions. Status post cholecystectomy with suspected hepaticojejunostomy. Mild central intrahepatic ductal prominence. Pancreas: Status post resection of the pancreatic head/uncinate process. Mild prominence of the main pancreatic duct, measuring 4 mm (series 3/image 32). Spleen: Within normal limits. Adrenals/Urinary Tract: Adrenal glands within normal limits. Small bilateral renal cysts, measuring up to 12 mm in the left lower pole (series 3/image 46). No hydronephrosis. Bladder is within normal limits. Stomach/Bowel: Distended stomach, suggesting some degree of gastric outlet obstruction. The patient is status post pylorus sparing Whipple with metallic stent extending from the gastric antrum to the jejunum (across the anastomosis). However,  there is masslike thickening in this region, likely reflecting tumor (series 3/image 34). No evidence of bowel obstruction. Appendix is not discretely visualized. Moderate left colonic stool burden, suggesting constipation. Vascular/Lymphatic: No evidence of abdominal aortic aneurysm. Atherosclerotic calcifications of the abdominal aorta and branch vessels. No suspicious abdominopelvic lymphadenopathy. Reproductive: Prostate is unremarkable. Other: Loculated ascites along the hepatoduodenal ligament, measuring 10.5 x 7.6 cm, with mass effect on adjacent structures (series 3/image 19), malignant. Additional abdominopelvic ascites. Peritoneal disease/omental caking, for example beneath the midline anterior abdominal wall (series 3/image 36). Musculoskeletal: Mild degenerative changes of the lumbar spine. IMPRESSION: Status post pylorus sparing Whipple. Metallic stent extending from the gastric antrum to the jejunum.  Distended stomach, suggesting some degree of gastric outlet obstruction. Associated masslike thickening in the region of the gastric antrum/residual duodenum, reflecting tumor. Overlying peritoneal disease/omental caking beneath the anterior abdominal wall. Loculated malignant ascites with mass effect in the right upper abdomen. No suspicious hepatic lesions. Mild central intrahepatic ductal prominence. These results were called by telephone at the time of interpretation on 01/21/2018 at 2:10 am to Dr. Cheri Rous , who verbally acknowledged these results. Electronically Signed   By: Julian Hy M.D.   On: 01/21/2018 02:19    Procedures Procedures (including critical care time)  Medications Ordered in ED Medications  sodium chloride 0.9 % bolus 1,000 mL (has no administration in time range)  pantoprazole (PROTONIX) 80 mg in sodium chloride 0.9 % 100 mL IVPB (has no administration in time range)  pantoprazole (PROTONIX) 80 mg in sodium chloride 0.9 % 250 mL (0.32 mg/mL) infusion (has no  administration in time range)     Initial Impression / Assessment and Plan / ED Course  I have reviewed the triage vital signs and the nursing notes.  Pertinent labs & imaging results that were available during my care of the patient were reviewed by me and considered in my medical decision making (see chart for details).  Clinical Course as of Jan 21 310  Sun Jan 20, 2018  2058 Spoke with Dr. Therisa Doyne who will consult tomorrow on patient.  Recommends admission to hospitalist service.  Patient remained hemodynamically stable.   [AM]  2110 Spoke with Dr. Stana Bunting of hospital medicine who will admit patient. Appreciate his involvement.    [AM]  2301 Spoke with Dr. Stana Bunting of [facility] hospitalist to update him on care.   [AM]  2304 Discussed antiemetics with Dr. Stana Bunting and elected to remove Zofran. Pt received Ativan for antiemetics.    [AM]  Mon Jan 21, 2018  0124 Skull fracture with subarachnoid called by radiologist. Page called out to Dr. Stana Bunting.   [AM]  [id number] Patient was evaluated at bedside with Dr. Stana Bunting.  Patient is acting more sleepy compared to prior.  He does respond to pain.  We discussed results of CT scan with patient's daughter.  Feel that patient's change in mental status likely secondary to medication, hepatic encephalopathy and not increase in intracranial bleeding. Dr. Stana Bunting to speak with neurosurgery.   [AM]    Clinical Course User Index [AM] Albesa Seen, PA-C    Patient is chronically ill-appearing, but in no acute distress and is hemodynamically stable.  History and examination are consistent with GI bleeding.  Patient also has extensively documented biliary obstructive disease secondary to his pancreatic cancer.  Abdomen is nontender and nonsurgical.  Hemoglobin is 7.7.  BUN is not elevated. Platelets 126.  Patient has significantly elevated alkaline phosphatase, consistent with his prior document of biliary obstructions.  Results from PET Scan in 07/2017:  1.  Study degraded in the abdomen and pelvis due to presence of dense contrast resulting in large amount of artifact. 2. There is hypermetabolic activity throughout the esophagus. 3. There is hypermetabolic focus distal stomach. 4. possible carcinomatosis just inferior to the distal stomach  Fluid bolus and Protonix initiated.  Will consult GI.  Patient previously had gastroenterology and hematology oncology care at [facility], but expresses the desire to switch his care to [facility] health system.   This is a shared visit with Dr. Isla Pence. Patient was independently evaluated by this attending physician. Attending physician consulted in evaluation and admission management.  10:55 PM  Called to bedside to evaluate patient with Dr. Isla Pence.  Patient had not received fluid bolus.  Patient attempted to stand to assist in using the urinal with his daughter, however had a syncopal episode due to feeling dizzy, and fell to the ground hit his head.  Patient has low platelets. Needs head CT.      Final Clinical Impressions(s) / ED Diagnoses   Final diagnoses:  Gastrointestinal hemorrhage, unspecified gastrointestinal hemorrhage type  Malignant neoplasm of pancreas, unspecified location of malignancy [facility])    ED Discharge Orders    None         Tamala Julian 01/21/18 3612    Isla Pence, MD 01/24/18 1527

## 2018-01-21 ENCOUNTER — Inpatient Hospital Stay (HOSPITAL_COMMUNITY): Payer: Medicare Other

## 2018-01-21 DIAGNOSIS — D500 Iron deficiency anemia secondary to blood loss (chronic): Secondary | ICD-10-CM

## 2018-01-21 DIAGNOSIS — C786 Secondary malignant neoplasm of retroperitoneum and peritoneum: Secondary | ICD-10-CM

## 2018-01-21 DIAGNOSIS — K311 Adult hypertrophic pyloric stenosis: Secondary | ICD-10-CM

## 2018-01-21 DIAGNOSIS — K922 Gastrointestinal hemorrhage, unspecified: Secondary | ICD-10-CM

## 2018-01-21 DIAGNOSIS — K7200 Acute and subacute hepatic failure without coma: Secondary | ICD-10-CM

## 2018-01-21 DIAGNOSIS — R0902 Hypoxemia: Secondary | ICD-10-CM

## 2018-01-21 DIAGNOSIS — S065X9A Traumatic subdural hemorrhage with loss of consciousness of unspecified duration, initial encounter: Secondary | ICD-10-CM

## 2018-01-21 DIAGNOSIS — K7682 Hepatic encephalopathy: Secondary | ICD-10-CM

## 2018-01-21 DIAGNOSIS — C259 Malignant neoplasm of pancreas, unspecified: Secondary | ICD-10-CM

## 2018-01-21 DIAGNOSIS — W19XXXA Unspecified fall, initial encounter: Secondary | ICD-10-CM

## 2018-01-21 DIAGNOSIS — J9601 Acute respiratory failure with hypoxia: Secondary | ICD-10-CM

## 2018-01-21 LAB — URINALYSIS, ROUTINE W REFLEX MICROSCOPIC
GLUCOSE, UA: NEGATIVE mg/dL
HGB URINE DIPSTICK: NEGATIVE
Ketones, ur: NEGATIVE mg/dL
LEUKOCYTES UA: NEGATIVE
Nitrite: NEGATIVE
PROTEIN: NEGATIVE mg/dL
Specific Gravity, Urine: 1.041 — ABNORMAL HIGH (ref 1.005–1.030)
pH: 5 (ref 5.0–8.0)

## 2018-01-21 LAB — AMMONIA: Ammonia: 80 umol/L — ABNORMAL HIGH (ref 9–35)

## 2018-01-21 LAB — CBC
HCT: 29.9 % — ABNORMAL LOW (ref 39.0–52.0)
Hemoglobin: 10.2 g/dL — ABNORMAL LOW (ref 13.0–17.0)
MCH: 32.8 pg (ref 26.0–34.0)
MCHC: 34.1 g/dL (ref 30.0–36.0)
MCV: 96.1 fL (ref 78.0–100.0)
PLATELETS: 98 10*3/uL — AB (ref 150–400)
RBC: 3.11 MIL/uL — AB (ref 4.22–5.81)
RDW: 22.7 % — ABNORMAL HIGH (ref 11.5–15.5)
WBC: 7.6 10*3/uL (ref 4.0–10.5)

## 2018-01-21 LAB — CBC WITH DIFFERENTIAL/PLATELET
BAND NEUTROPHILS: 18 %
BLASTS: 0 %
Basophils Absolute: 0 10*3/uL (ref 0.0–0.1)
Basophils Relative: 0 %
EOS ABS: 0 10*3/uL (ref 0.0–0.7)
Eosinophils Relative: 0 %
HCT: 30.5 % — ABNORMAL LOW (ref 39.0–52.0)
Hemoglobin: 10.2 g/dL — ABNORMAL LOW (ref 13.0–17.0)
LYMPHS PCT: 1 %
Lymphs Abs: 0.1 10*3/uL — ABNORMAL LOW (ref 0.7–4.0)
MCH: 32.5 pg (ref 26.0–34.0)
MCHC: 33.4 g/dL (ref 30.0–36.0)
MCV: 97.1 fL (ref 78.0–100.0)
MONOS PCT: 2 %
Metamyelocytes Relative: 0 %
Monocytes Absolute: 0.1 10*3/uL (ref 0.1–1.0)
Myelocytes: 0 %
NEUTROS ABS: 7.2 10*3/uL (ref 1.7–7.7)
Neutrophils Relative %: 79 %
Other: 0 %
Platelets: 117 10*3/uL — ABNORMAL LOW (ref 150–400)
Promyelocytes Relative: 0 %
RBC: 3.14 MIL/uL — AB (ref 4.22–5.81)
RDW: 22.4 % — ABNORMAL HIGH (ref 11.5–15.5)
SMEAR REVIEW: DECREASED
WBC: 7.4 10*3/uL (ref 4.0–10.5)
nRBC: 0 /100 WBC

## 2018-01-21 LAB — GLUCOSE, CAPILLARY
GLUCOSE-CAPILLARY: 84 mg/dL (ref 70–99)
GLUCOSE-CAPILLARY: 94 mg/dL (ref 70–99)
Glucose-Capillary: 86 mg/dL (ref 70–99)
Glucose-Capillary: 83 mg/dL (ref 70–99)

## 2018-01-21 LAB — IRON AND TIBC
Iron: 68 ug/dL (ref 45–182)
SATURATION RATIOS: 35 % (ref 17.9–39.5)
TIBC: 195 ug/dL — ABNORMAL LOW (ref 250–450)
UIBC: 127 ug/dL

## 2018-01-21 LAB — COMPREHENSIVE METABOLIC PANEL
ALBUMIN: 2.1 g/dL — AB (ref 3.5–5.0)
ALT: 96 U/L — ABNORMAL HIGH (ref 0–44)
ANION GAP: 14 (ref 5–15)
AST: 160 U/L — ABNORMAL HIGH (ref 15–41)
Alkaline Phosphatase: 1583 U/L — ABNORMAL HIGH (ref 38–126)
BILIRUBIN TOTAL: 4.4 mg/dL — AB (ref 0.3–1.2)
BUN: 10 mg/dL (ref 8–23)
CO2: 27 mmol/L (ref 22–32)
Calcium: 7.3 mg/dL — ABNORMAL LOW (ref 8.9–10.3)
Chloride: 97 mmol/L — ABNORMAL LOW (ref 98–111)
Creatinine, Ser: 0.74 mg/dL (ref 0.61–1.24)
GFR calc non Af Amer: >60 mL/min (ref >60)
GLUCOSE: 96 mg/dL (ref 70–99)
POTASSIUM: 3.2 mmol/L — AB (ref 3.5–5.1)
SODIUM: 138 mmol/L (ref 135–145)
TOTAL PROTEIN: 5.8 g/dL — AB (ref 6.5–8.1)

## 2018-01-21 LAB — PREPARE RBC (CROSSMATCH)

## 2018-01-21 LAB — FERRITIN: Ferritin: 4933 ng/mL — ABNORMAL HIGH (ref 24–336)

## 2018-01-21 LAB — VITAMIN B12: Vitamin B-12: 1136 pg/mL — ABNORMAL HIGH (ref 180–914)

## 2018-01-21 MED ORDER — SODIUM CHLORIDE 0.9 % IV SOLN
INTRAVENOUS | Status: DC
  Administered 2018-01-21: 17:00:00 via INTRAVENOUS

## 2018-01-21 MED ORDER — MORPHINE SULFATE (PF) 2 MG/ML IV SOLN: 0.5000 mg | INTRAVENOUS | Status: DC | PRN

## 2018-01-21 MED ORDER — SODIUM CHLORIDE 0.9 % IV SOLN
2.0000 g | Freq: Every day | INTRAVENOUS | Status: DC
  Administered 2018-01-21 – 2018-01-22 (×2): 2 g via INTRAVENOUS
  Filled 2018-01-21 (×2): qty 20

## 2018-01-21 MED ORDER — LACTULOSE ENEMA
300.0000 mL | Freq: Two times a day (BID) | ORAL | Status: DC
  Administered 2018-01-21 – 2018-01-22 (×3): 300 mL via RECTAL
  Filled 2018-01-21 (×7): qty 300

## 2018-01-21 MED ORDER — IOHEXOL 300 MG/ML  SOLN
75.0000 mL | Freq: Once | INTRAMUSCULAR | Status: AC | PRN
  Administered 2018-01-21: 75 mL via INTRAVENOUS

## 2018-01-21 MED ORDER — POTASSIUM CHLORIDE 10 MEQ/100ML IV SOLN
10.0000 meq | INTRAVENOUS | Status: AC
  Administered 2018-01-21 (×3): 10 meq via INTRAVENOUS
  Filled 2018-01-21: qty 100

## 2018-01-21 NOTE — ED Notes (Signed)
Pt back from CT, somnolent, responsive to pain, admitting MD and EDP made aware.

## 2018-01-21 NOTE — Consult Note (Signed)
[place]  Telephone:([phone]) [phone]   [place]  DOB: October 02, 1945  MR#: [id number]  CSN#: [id number]    Requesting Physician: [facility] Dr. Tyrell Antonio   Patient Care Team: Medicine, [facility] as PCP - General (Family Medicine)  Reason for consult: Metastatic pancreatic cancer  History of present illness: 72 year old male, originally from [place], with history of pancreatic cancer status post Whipple surgery in September 2018, followed by chemoradiation.  He presented to the hospital with worsening weakness, nausea and vomiting.  CT scan showed probable metastatic pancreatic cancer, I was called to see the patient guarding his cancer management.  Patient is unresponsive, I spoke with patient's daughter and reviewed his chart.  Patient did receive concurrent chemoradiation with chemotherapy after his Whipple surgery last year.  Adjuvant chemotherapy initially was recommended but pt declined. He started having nausea and vomiting and weight loss in March 2019, was seen by his oncologist Dr. Georgiann Cocker at [place], scan showed recurrent cancer involving the esophagus, stomach and peritoneal carcinomatosis.  He was in the hospital for over 20 days, a duodenal stent was placed on July 31, 2017.  Patient declined hospice, returned to [place] and received herb therapy.  Due to the worsening symptoms, his daughter brought him back last week, and came to [facility] for further evaluation.   Unfortunately patient had a fall in his room yesterday, and a CT scan showed subdural hemorrhage.  He has also had coffee-ground emesis, suspicious for upper GI bleeding.  He has NG tube placed.  Seen by GI, received 2 units of blood.  He was also found to be hypoxic, currently on nonrebreather.   MEDICAL HISTORY:  Past Medical History:  Diagnosis Date  . Blurred vision    "fluid in head"-   . Complication of anesthesia    " had  difficulty waking up."  . Diabetes mellitus without complication ([place])    ?- patient states prediabetes  . DJD (degenerative joint disease)   . Elevated PSA   . Frequency of urination   . GERD (gastroesophageal reflux disease)    occ  . History of kidney stones    yrs ago- patient denies- "it was an Infection".  . Hypothyroidism   . Mixed hyperlipidemia    MILD PER PT  . Nocturia   . Seizures ([place])    x 2 2016- "none since taking Gabapentin"    SURGICAL HISTORY: Past Surgical History:  Procedure Laterality Date  . COLONOSCOPY W/ POLYPECTOMY     x 2  . CRANIOPLASTY N/A 05/22/2014   Procedure: CRANIOPLASTY;  Surgeon: Ashok Pall, MD;  Location: [place];  Service: Neurosurgery;  Laterality: N/A;  Cranioplasty  . CRANIOTOMY Bilateral 05/08/2014   Procedure: BILATERAL FRONTAL PARIETAL CRANIOTOMY TUMOR EXCISION;  Surgeon: Ashok Pall, MD;  Location: [place] [facility]URO ORS;  Service: Neurosurgery;  Laterality: Bilateral;  . CRANIOTOMY N/A 01/12/2016   Procedure: CRANIOTOMY FOR  CEREBRO SPINAL FLUID LEAK;  Surgeon: Ashok Pall, MD;  Location: [place] NEURO ORS;  Service: Neurosurgery;  Laterality: N/A;  . HEMATOMA EVACUATION N/A 12/31/2015   Procedure: ASPIRATION OF SUBCUTANEOUS FLUID COLLECTION of Cranial Defect;  Surgeon: Ashok Pall, MD;  Location: [place] NEURO ORS;  Service: Neurosurgery;  Laterality: N/A;  . PLACEMENT OF LUMBAR DRAIN  01/12/2016   Procedure: PLACEMENT OF LUMBAR DRAIN;  Surgeon: Ashok Pall, MD;  Location: [place] NEURO ORS;  Service: Neurosurgery;;  . PROSTATE BIOPSY N/A 07/19/2012   Procedure: EUA, PROSTATE  BIOPSY TRANSRECTAL ULTRASONIC PROSTATE (TUBP);  Surgeon: Fredricka Bonine, MD;  Location: [facility];  Service: Urology;  Laterality: N/A;  . RADIOLOGY WITH ANESTHESIA N/A 05/07/2014   Procedure: RADIOLOGY WITH ANESTHESIA/EMBOLIZATION;  Surgeon: Rob Hickman, MD;  Location: [place];  Service: Radiology;  Laterality: N/A;  . TONSILLECTOMY      SOCIAL  HISTORY: Social History   Socioeconomic History  . Marital status: Married    Spouse name: Not on file  . Number of children: Not on file  . Years of education: Not on file  . Highest education level: Not on file  Occupational History  . Not on file  Social Needs  . Financial resource strain: Not on file  . Food insecurity:    Worry: Not on file    Inability: Not on file  . Transportation needs:    Medical: Not on file    Non-medical: Not on file  Tobacco Use  . Smoking status: Former Smoker    Years: 15.00    Types: Cigarettes    Last attempt to quit: 05/05/1981    Years since quitting: 36.7  . Smokeless tobacco: Never Used  . Tobacco comment: QUIT SMOKING 34 YRS AGO--  APPROX. 1980'S  Substance and Sexual Activity  . Alcohol use: No    Comment: quit 34 yrs ago  . Drug use: No  . Sexual activity: Not on file  Lifestyle  . Physical activity:    Days per week: Not on file    Minutes per session: Not on file  . Stress: Not on file  Relationships  . Social connections:    Talks on phone: Not on file    Gets together: Not on file    Attends religious service: Not on file    Active member of club or organization: Not on file    Attends meetings of clubs or organizations: Not on file    Relationship status: Not on file  . Intimate partner violence:    Fear of current or ex partner: Not on file    Emotionally abused: Not on file    Physically abused: Not on file    Forced sexual activity: Not on file  Other Topics Concern  . Not on file  Social History Narrative  . Not on file    FAMILY HISTORY: Family History  Problem Relation Age of Onset  . Diabetes Other     ALLERGIES:  is allergic to orange fruit [citrus].  MEDICATIONS:  Current Facility-Administered Medications  Medication Dose Route Frequency Provider Last Rate Last Dose  . 0.9 %  sodium chloride infusion (Manually program via Guardrails IV Fluids)   Intravenous Once Isla Pence, MD      . 0.9 %   sodium chloride infusion   Intravenous Continuous Regalado, Belkys A, MD      . cefTRIAXone (ROCEPHIN) 2 g in sodium chloride 0.9 % 100 mL IVPB  2 g Intravenous Daily Regalado, Belkys A, MD 200 mL/hr at 01/21/18 1015 2 g at 01/21/18 1015  . lactulose (CHRONULAC) enema 200 gm  300 mL Rectal BID Regalado, Belkys A, MD      . levothyroxine (SYNTHROID, LEVOTHROID) injection 75 mcg  75 mcg Intravenous Daily Vilma Prader, MD   75 mcg at 01/21/18 0846  . morphine 2 MG/ML injection 0.5 mg  0.5 mg Intravenous Q4H PRN Regalado, Belkys A, MD      . pantoprazole (PROTONIX) 80 mg in sodium chloride 0.9 % 250 mL (  0.32 mg/mL) infusion  8 mg/hr Intravenous Continuous Vilma Prader, MD 25 mL/hr at 01/01/2018 2336 8 mg/hr at 01/07/2018 2336  . potassium chloride 10 mEq in 100 mL IVPB  10 mEq Intravenous Q1 Hr x 3 Regalado, Belkys A, MD 100 mL/hr at 01/21/18 1624 10 mEq at 01/21/18 1624  . sodium chloride flush (NS) 0.9 % injection 3 mL  3 mL Intravenous Q12H Vilma Prader, MD        REVIEW OF SYSTEMS:   Not able to review with pt, per his daughter, he has had intermittent nausea, vomiting, weight loss, poor appetite, and deteriorated functional status at home.  PHYSICAL EXAMINATION: ECOG PERFORMANCE STATUS: 4 - Bedbound  Vitals:   01/21/18 1432 01/21/18 1438  BP:    Pulse:    Resp:    Temp:    SpO2: (!) 88% 97%   Filed Weights   01/21/18 0344  Weight: 145 lb 8.1 oz (66 kg)    GENERAL: Unresponsive, spontaneous move his head, on nonrebreather SKIN: skin color, texture, turgor are normal, no rashes or significant lesions LUNGS: clear to auscultation and percussion with normal breathing effort HEART: regular rate & rhythm and no murmurs and no lower extremity edema ABDOMEN:abdomen soft, non-tender and normal bowel sounds Musculoskeletal:no cyanosis of digits and no clubbing   LABORATORY DATA:  I have reviewed the data as listed Lab Results  Component Value Date   WBC 7.4 01/21/2018    HGB 10.2 (L) 01/21/2018   HCT 30.5 (L) 01/21/2018   MCV 97.1 01/21/2018   PLT 117 (L) 01/21/2018   Recent Labs    03/01/17 0225 03/01/17 0241 01/08/2018 1848 01/21/18 1132  NA 140 142 137 138  K 4.1 4.0 3.5 3.2*  CL 105 103 100 97*  CO2 29  --  27 27  GLUCOSE 115* 115* 110* 96  BUN 6 8 7* 10  CREATININE 0.89 0.80 0.73 0.74  CALCIUM 8.8*  --  7.8* 7.3*  GFRNONAA >60  --  >60 >60  GFRAA >60  --  >60 >60  PROT 6.9  --  6.3* 5.8*  ALBUMIN 3.7  --  2.4* 2.1*  AST 31  --  204* 160*  ALT 27  --  110* 96*  ALKPHOS 135*  --  1,953* 1,583*  BILITOT 0.5  --  4.2* 4.4*    RADIOGRAPHIC STUDIES: I have personally reviewed the radiological images as listed and agreed with the findings in the report. Dg Chest 2 View  Result Date: 12/27/2017 CLINICAL DATA:  Weakness.  Vomiting.  Lethargy. EXAM: CHEST - 2 VIEW COMPARISON:  Radiographs 01/19/2016 FINDINGS: Lung volumes are low. The cardiomediastinal contours are normal. No evidence of pneumomediastinum. The lungs are clear. Pulmonary vasculature is normal. No consolidation, pleural effusion, or pneumothorax. No acute osseous abnormalities are seen. IMPRESSION: Low lung volumes without acute chest finding. Electronically Signed   By: Keith Rake M.D.   On: 12/29/2017 21:02   Ct Head Wo Contrast  Result Date: 01/21/2018 CLINICAL DATA:  Patient unresponsive. Status post fall with an intracranial hemorrhage yesterday. EXAM: CT HEAD WITHOUT CONTRAST TECHNIQUE: Contiguous axial images were obtained from the base of the skull through the vertex without intravenous contrast. COMPARISON:  Head CT scan 01/21/2018. FINDINGS: Brain: Small volume of subarachnoid hemorrhage in the right frontal and temporal lobes is again seen and unchanged. Trace amount of subdural hemorrhage over the high left parietal convexities is also again seen. Right frontal lobe encephalomalacia with a small amount  of intraparenchymal and/or extra-axial hemorrhage along the superior  aspect of the frontal lobe are stable in appearance. Vascular: Atherosclerosis noted. Skull: Nondisplaced left parietal bone fracture is again seen. 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 Sinuses/Orbits: Minimal mucosal thickening right maxillary sinus is seen. Otherwise negative. Other: None. IMPRESSION: No new abnormality since the prior head CT. Nondisplaced left parietal bone fracture, trace amount of subarachnoid hemorrhage on the right and small volume of subdural or epidural hemorrhage subjacent to the patient's fracture all appear unchanged. Postoperative change on the right with right frontal encephalomalacia. Small amount intraparenchymal and or extra-axial hemorrhage overlying seen encephalomalacia appear unchanged. Electronically Signed   By: Inge Rise M.D.   On: 01/21/2018 11:06   Ct Head Wo Contrast  Result Date: 01/21/2018 CLINICAL DATA:  Witnessed fall. Struck posterior head. History of seizures and resected meningioma. EXAM: CT HEAD WITHOUT CONTRAST TECHNIQUE: Contiguous axial images were obtained from the base of the skull through the vertex without intravenous contrast. COMPARISON:  MRI head March 01, 2017 and CT HEAD March 01, 2017. FINDINGS: BRAIN: Small volume RIGHT frontotemporal subarachnoid hemorrhage. RIGHT mesial frontal lobe encephalomalacia with acute hemorrhage which may be intraparenchymal or extra-axial. Trace LEFT parietal extra-axial hemorrhage. No midline shift, mass effect or acute large vascular territory infarcts. Stable low-density convexity fluid collection with trace acute blood products. Basal cisterns are patent. VASCULAR: Moderate calcific atherosclerosis of the carotid siphons. SKULL: Acute nondisplaced LEFT parietal skull fracture. 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 Moderate LEFT parietal scalp hematoma. Moderate temporomandibular osteoarthrosis.  SINUSES/ORBITS: Mild paranasal sinus mucosal thickening. Mastoid air cells are well aerated.The included ocular globes and orbital contents are non-suspicious. OTHER: None. IMPRESSION: 1. Acute nondisplaced LEFT parietal skull fracture. Trace subjacent epidural versus subdural hematoma. Small volume RIGHT frontotemporal subarachnoid hemorrhage. 2. RIGHT frontal intraparenchymal versus extra-axial blood products superimposed on RIGHT frontal encephalomalacia. 3. 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 4. Critical Value/emergent results were called by telephone at the time of interpretation on 01/21/2018 at 1:32 am to [place] , who verbally acknowledged these results. Electronically Signed   By: Elon Alas M.D.   On: 01/21/2018 01:29   Ct Abdomen Pelvis W Contrast  Result Date: 01/21/2018 CLINICAL DATA:  Metastatic pancreatic cancer, status post pylorus sparing pancreaticoduodenectomy, radiation, and chemotherapy, with weakness and vomiting. EXAM: CT ABDOMEN AND PELVIS WITH CONTRAST TECHNIQUE: Multidetector CT imaging of the abdomen and pelvis was performed using the standard protocol following bolus administration of intravenous contrast. CONTRAST:  44mL OMNIPAQUE IOHEXOL 300 MG/ML  SOLN COMPARISON:  09/29/2016. FINDINGS: Lower chest: Linear scarring in the right lower lobe. Hepatobiliary: No suspicious hepatic lesions. Status post cholecystectomy with suspected hepaticojejunostomy. Mild central intrahepatic ductal prominence. Pancreas: Status post resection of the pancreatic head/uncinate process. Mild prominence of the main pancreatic duct, measuring 4 mm (series 3/image 32). Spleen: Within normal limits. Adrenals/Urinary Tract: Adrenal glands within normal limits. Small bilateral renal cysts, measuring up to 12 mm in the left lower pole (series 3/image 46). No hydronephrosis. Bladder is within  normal limits. Stomach/Bowel: Distended stomach, suggesting some degree of gastric outlet obstruction. The patient is status post pylorus sparing Whipple with metallic stent extending from the gastric antrum to the jejunum (across the anastomosis). However, there is masslike thickening in this region, likely reflecting tumor (series 3/image 34). No evidence of bowel obstruction. Appendix is not discretely visualized. Moderate left colonic stool burden,  suggesting constipation. Vascular/Lymphatic: No evidence of abdominal aortic aneurysm. Atherosclerotic calcifications of the abdominal aorta and branch vessels. No suspicious abdominopelvic lymphadenopathy. Reproductive: Prostate is unremarkable. Other: Loculated ascites along the hepatoduodenal ligament, measuring 10.5 x 7.6 cm, with mass effect on adjacent structures (series 3/image 19), malignant. Additional abdominopelvic ascites. Peritoneal disease/omental caking, for example beneath the midline anterior abdominal wall (series 3/image 36). Musculoskeletal: Mild degenerative changes of the lumbar spine. IMPRESSION: Status post pylorus sparing Whipple. Metallic stent extending from the gastric antrum to the jejunum. Distended stomach, suggesting some degree of gastric outlet obstruction. Associated masslike thickening in the region of the gastric antrum/residual duodenum, reflecting tumor. Overlying peritoneal disease/omental caking beneath the anterior abdominal wall. Loculated malignant ascites with mass effect in the right upper abdomen. No suspicious hepatic lesions. Mild central intrahepatic ductal prominence. These results were called by telephone at the time of interpretation on 01/21/2018 at 2:10 am to Dr. Cheri Rous , who verbally acknowledged these results. Electronically Signed   By: Julian Hy M.D.   On: 01/21/2018 02:19   Dg Chest Port 1 View  Result Date: 01/21/2018 CLINICAL DATA:  Hypoxemia today. EXAM: PORTABLE CHEST 1 VIEW  COMPARISON:  PA and lateral chest 01/17/2018 and 01/19/2016. FINDINGS: The patient is rotated to the left on the examination. The lungs are clear. Heart size is normal. No pneumothorax or pleural effusion. NG tube is in place with the tip in good position in the fundus of the stomach. IMPRESSION: No acute disease. NG tube in good position. Electronically Signed   By: Inge Rise M.D.   On: 01/21/2018 13:49    ASSESSMENT & PLAN: 72 year old male from [place], history of pancreatic cancer, status post Whipple surgery in September 2018, and adjuvant chemoradiation, but patient declined adjuvant chemotherapy.  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  He has had worsening intermittent nausea, vomiting, weight loss.  1.  Acute encephalopathy, secondary to multiorgan failure, and subdural and intracranial hemorrhage. 2.  Metastatic pancreatic cancer 3.  Hyperbilirubinemia and liver failure, secondary to malignant obstruction  4.  Upper GI bleeding, secondary to local recurrence 5.  Acute blood loss anemia, status post 2 units blood transfusion 6.  Acute hypoxic respiratory failure, unsure about etiology  7.  Intermittent nausea, vomiting, weight loss, and deconditioning, secondary to underlying malignancy.   Recommendations: -Patient has developed multiorgan failure,including liver failure, respiratory failure, metabolic encephalopathy, his overall prognosis is extremely poor, he is currently unresponsive, on nonrebreather, I anticipate he is probably going to die in the next few days.  -I have discussed with his daughter and wife.  Patient previously has refused chemotherapy repeatedly, at current condition, he is certainly not a candidate for chemotherapy.  I recommend hospice/comfort care alone.  His wife and daughter are agreeable. -Pt has agreed with DNR on admission  -Family wants to try lactulose enema, to see if his encephalopathy will improve, which  is reasonable. But they are in agreement with comfort care. We discussed dying in hospital vs at home, family wants him to stay in the hospital, which is also my recommendation  -please consult palliative care to initiate comfort care  -I will see him as needed, please call me if there is any question.    All questions were answered. The patient knows to call the clinic with any problems, questions or concerns.      Truitt Merle, MD 01/21/2018 5:06 PM

## 2018-01-21 NOTE — Progress Notes (Signed)
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  He presents after just coming back to the [place] yesterday with a constellation of symptoms including fatigue, weakness, nausea, vomiting, coffee-ground emesis, declining functional status.   Primary medical oncologist Dr. Georgiann Cocker, of [facility].  A Port-A-Cath was placed in anticipation for systemic adjuvant therapy, however the patient ultimately was concerned about toxicity associated with systemic therapy and had the Port-A-Cath removed.  He denies any gross bleeding, reports weight loss as well as symptoms mentioned above.  Has not required a blood transfusion.  Reports that alternative treatments received in [place] include mistletoe.  He reports he does understand that his oncologic diagnosis is terminal will be the cause of his death.  He does report that if anticancer therapy were offered, he would be interested in taking therapy and a palliative intent.  ED Course: In the emergency room vital signs remarkable for heart rate of 100 and normalized prior to admission, blood pressure with systolic ranging from 628 - 113.  Labs reveal hemoglobin 7.7, macrocytic with MCV of 124, platelet count 120.  CMP with gross derangements of hepatic function including, alk phos of 1900, total bilirubin 4.2, AST 204, ALT 110, albumin 2.4.  Creatinine normal.  Chest x-ray without acute findings.  Emergency medicine team reports to perform rectal exam with melena.  Due to concern about acute GI bleed, given IV PPI, gastroenterology consulted for consideration of endoscopic  evaluation-see report they will evaluate the patient in a.m.  Hospital medicine consulted for further management.  Patient fell in the ED, CT head showed; subdural Hematoma, SAH, and non displaced parietal skull fracture.   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  Repeated CT head stable. Neurosurgery recommend further work up for encephalopathy if CT stable. Exam out of proportion for CT head finding. Patient didn't received sedatives. Ammonia level is elevated at 80. Start lactulose enema.  -Will check EEG.  -Start antibiotics to cover for SBP prophylaxis.  -CO 2 on B-met at 27, not elevated. For hypoxemia, repeat chest x ray.  -Patient is DNR, DNI.  -IV synthroid.   2-GI bleed, coffee ground emesis;  On IV protonix gtt.  Has NG tube.  CT abdomen with gastric outlet obstruction.  GI consulted.  Received 2 units PRBC.   3-Acute blood loss anemia;  Related to GI bleed, related to malignancy.  Received 2 units PRBC>  Repeat hb tonight.    4-Pancreaticobiliary cancer;  treated with a Whipple in September 2018, followed by capecitabine + radiation, CT ; Metallic stent extending from the gastric antrum to the jejunum. Distended stomach, suggesting some degree of gastric outlet obstruction. Associated masslike thickening in the region of the gastric antrum/residual duodenum, reflecting tumor. Overlying peritoneal disease/omental caking beneath the anterior abdominal wall. Loculated malignant ascites with mass effect in the right upper abdomen. -Oncology consulted.  Transaminases, hyperbilirubinemia, related to malignancy  5-Acute hypoxic respiratory failure;  On NB.  Repeat chest x ray, clear.  Support.    DNR, DNI>   6-Hypokalemia; replete IV   7-Hypothyroidism; IV synthroid.   DVT prophylaxis: SCD. No anticoagulation give head bleed.  Code Status: DNR Family Communication: care discussed with daughter, explain to her patient is very ill, await oncology evaluation, she would like to try lactulose to see if that will make him more alert.  Disposition Plan: remain in the hospital for further care of multiple medical problems, GI bleed, SDH, Encephalopathy .   Consultants:   Oncology  Neurosurgery.   Gastroenterologist    Procedures:   EEG; pending.    Antimicrobials:  Ceftriaxone, to cover for SBP.    Subjective: Obtunded, only makes noise, like if he is pain.  No responsive  Objective: Vitals:   01/21/18 0820 01/21/18 1415 01/21/18 1432 01/21/18 1438  BP: 122/84 114/77    Pulse: 79 83    Resp: 16 12    Temp: 98.9 F (37.2 C) 98 F (36.7 C)    TempSrc: Oral Axillary    SpO2: 100% 99% (!) 88% 97%  Weight:        Intake/Output Summary (Last 24 hours) at 01/21/2018 1626 Last data filed at 01/21/2018 1437 Gross per 24 hour  Intake 921.93 ml  Output 550 ml  Net 371.93 ml   Filed Weights   01/21/18 0344  Weight: 66 kg    Examination:  General exam: obtunded.  Respiratory system: Bilateral air movement on NB mask. Few ronchus.  Cardiovascular system: S1 & S2 heard, RRR.  Gastrointestinal system: BS present, distended.  Central nervous system: obtunded, no responsive, make noise, grouted.  Extremities: BL edema Psychiatry: unable to evaluate.     Data Reviewed: I have personally reviewed following labs and imaging studies  CBC: Recent Labs  Lab 01/16/2018 1848 01/21/18 1132  WBC 7.8 7.4  NEUTROABS  --  7.2  HGB 7.7* 10.2*  HCT 26.6* 30.5*  MCV 124.3* 97.1  PLT 126* 161*   Basic Metabolic Panel: Recent Labs  Lab 12/25/2017 1848 01/21/18 1132  NA 137 138  K 3.5 3.2*  CL 100 97*  CO2 27 27  GLUCOSE 110* 96  BUN 7* 10  CREATININE 0.73 0.74   CALCIUM 7.8* 7.3*   GFR: CrCl cannot be calculated (Unknown ideal weight.). Liver Function Tests: Recent Labs  Lab 01/15/2018 1848 01/21/18 1132  AST 204* 160*  ALT 110* 96*  ALKPHOS 1,953* 1,583*  BILITOT 4.2* 4.4*  PROT 6.3* 5.8*  ALBUMIN 2.4* 2.1*   No results for input(s): LIPASE, AMYLASE in the last 168 hours. Recent Labs  Lab 01/07/2018 2121 01/21/18 1132  AMMONIA 87* 80*   Coagulation Profile: Recent Labs  Lab 12/28/2017 2121  INR 1.12   Cardiac Enzymes: No results for input(s): CKTOTAL, CKMB, CKMBINDEX, TROPONINI in the last 168 hours. BNP (last 3 results) No results for input(s): PROBNP in the last 8760 hours. HbA1C: No results for input(s): HGBA1C in the last 72 hours. CBG: Recent Labs  Lab 01/05/2018 2238 01/21/18 0854 01/21/18 1221 01/21/18 1613  GLUCAP 118* 84 94 86   Lipid Profile: No results for input(s): CHOL, HDL, LDLCALC, TRIG, CHOLHDL, LDLDIRECT in the last 72 hours. Thyroid Function Tests: Recent Labs    12/27/2017 2121  TSH 22.806*   Anemia Panel: Recent Labs    01/21/18 1132  VITAMINB12 1,136*  FERRITIN 4,933*  TIBC 195*  IRON 68   Sepsis Labs: No results for  input(s): PROCALCITON, LATICACIDVEN in the last 168 hours.  No results found for this or any previous visit (from the past 240 hour(s)).       Radiology Studies: Dg Chest 2 View  Result Date: 01/13/2018 CLINICAL DATA:  Weakness.  Vomiting.  Lethargy. EXAM: CHEST - 2 VIEW COMPARISON:  Radiographs 01/19/2016 FINDINGS: Lung volumes are low. The cardiomediastinal contours are normal. No evidence of pneumomediastinum. The lungs are clear. Pulmonary vasculature is normal. No consolidation, pleural effusion, or pneumothorax. No acute osseous abnormalities are seen. IMPRESSION: Low lung volumes without acute chest finding. Electronically Signed   By: Keith Rake M.D.   On: 01/16/2018 21:02   Ct Head Wo Contrast  Result Date: 01/21/2018 CLINICAL DATA:  Patient unresponsive.  Status post fall with an intracranial hemorrhage yesterday. EXAM: CT HEAD WITHOUT CONTRAST TECHNIQUE: Contiguous axial images were obtained from the base of the skull through the vertex without intravenous contrast. COMPARISON:  Head CT scan 01/21/2018. FINDINGS: Brain: Small volume of subarachnoid hemorrhage in the right frontal and temporal lobes is again seen and unchanged. Trace amount of subdural hemorrhage over the high left parietal convexities is also again seen. Right frontal lobe encephalomalacia with a small amount of intraparenchymal and/or extra-axial hemorrhage along the superior aspect of the frontal lobe are stable in appearance. Vascular: Atherosclerosis noted. Skull: Nondisplaced left parietal bone fracture is again seen. 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 Sinuses/Orbits: Minimal mucosal thickening right maxillary sinus is seen. Otherwise negative. Other: None. IMPRESSION: No new abnormality since the prior head CT. Nondisplaced left parietal bone fracture, trace amount of subarachnoid hemorrhage on the right and small volume of subdural or epidural hemorrhage subjacent to the patient's fracture all appear unchanged. Postoperative change on the right with right frontal encephalomalacia. Small amount intraparenchymal and or extra-axial hemorrhage overlying seen encephalomalacia appear unchanged. Electronically Signed   By: Inge Rise M.D.   On: 01/21/2018 11:06   Ct Head Wo Contrast  Result Date: 01/21/2018 CLINICAL DATA:  Witnessed fall. Struck posterior head. History of seizures and resected meningioma. EXAM: CT HEAD WITHOUT CONTRAST TECHNIQUE: Contiguous axial images were obtained from the base of the skull through the vertex without intravenous contrast. COMPARISON:  MRI head March 01, 2017 and CT HEAD March 01, 2017. FINDINGS: BRAIN: Small volume RIGHT frontotemporal subarachnoid hemorrhage. RIGHT mesial frontal lobe  encephalomalacia with acute hemorrhage which may be intraparenchymal or extra-axial. Trace LEFT parietal extra-axial hemorrhage. No midline shift, mass effect or acute large vascular territory infarcts. Stable low-density convexity fluid collection with trace acute blood products. Basal cisterns are patent. VASCULAR: Moderate calcific atherosclerosis of the carotid siphons. SKULL: Acute nondisplaced LEFT parietal skull fracture. 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 Moderate LEFT parietal scalp hematoma. Moderate temporomandibular osteoarthrosis. SINUSES/ORBITS: Mild paranasal sinus mucosal thickening. Mastoid air cells are well aerated.The included ocular globes and orbital contents are non-suspicious. OTHER: None. IMPRESSION: 1. Acute nondisplaced LEFT parietal skull fracture. Trace subjacent epidural versus subdural hematoma. Small volume RIGHT frontotemporal subarachnoid hemorrhage. 2. RIGHT frontal intraparenchymal versus extra-axial blood products superimposed on RIGHT frontal encephalomalacia. 3. 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 4. Critical Value/emergent results were called by telephone at the time of interpretation on 01/21/2018 at 4:16 am to [place] , who verbally acknowledged these results. Electronically Signed   By: Elon Alas M.D.   On: 01/21/2018 01:29   Ct Abdomen Pelvis W Contrast  Result Date: 01/21/2018 CLINICAL DATA:  Metastatic pancreatic cancer, status post pylorus sparing pancreaticoduodenectomy, radiation, and chemotherapy, with weakness and vomiting. EXAM: CT ABDOMEN AND PELVIS WITH CONTRAST TECHNIQUE: Multidetector CT imaging of the abdomen and pelvis was performed using the standard protocol following bolus administration of intravenous contrast. CONTRAST:  10m OMNIPAQUE IOHEXOL 300 MG/ML  SOLN COMPARISON:  09/29/2016.  FINDINGS: Lower chest: Linear scarring in the right lower lobe. Hepatobiliary: No suspicious hepatic lesions. Status post cholecystectomy with suspected hepaticojejunostomy. Mild central intrahepatic ductal prominence. Pancreas: Status post resection of the pancreatic head/uncinate process. Mild prominence of the main pancreatic duct, measuring 4 mm (series 3/image 32). Spleen: Within normal limits. Adrenals/Urinary Tract: Adrenal glands within normal limits. Small bilateral renal cysts, measuring up to 12 mm in the left lower pole (series 3/image 46). No hydronephrosis. Bladder is within normal limits. Stomach/Bowel: Distended stomach, suggesting some degree of gastric outlet obstruction. The patient is status post pylorus sparing Whipple with metallic stent extending from the gastric antrum to the jejunum (across the anastomosis). However, there is masslike thickening in this region, likely reflecting tumor (series 3/image 34). No evidence of bowel obstruction. Appendix is not discretely visualized. Moderate left colonic stool burden, suggesting constipation. Vascular/Lymphatic: No evidence of abdominal aortic aneurysm. Atherosclerotic calcifications of the abdominal aorta and branch vessels. No suspicious abdominopelvic lymphadenopathy. Reproductive: Prostate is unremarkable. Other: Loculated ascites along the hepatoduodenal ligament, measuring 10.5 x 7.6 cm, with mass effect on adjacent structures (series 3/image 19), malignant. Additional abdominopelvic ascites. Peritoneal disease/omental caking, for example beneath the midline anterior abdominal wall (series 3/image 36). Musculoskeletal: Mild degenerative changes of the lumbar spine. IMPRESSION: Status post pylorus sparing Whipple. Metallic stent extending from the gastric antrum to the jejunum. Distended stomach, suggesting some degree of gastric outlet obstruction. Associated masslike thickening in the region of the gastric antrum/residual duodenum,  reflecting tumor. Overlying peritoneal disease/omental caking beneath the anterior abdominal wall. Loculated malignant ascites with mass effect in the right upper abdomen. No suspicious hepatic lesions. Mild central intrahepatic ductal prominence. These results were called by telephone at the time of interpretation on 01/21/2018 at 2:10 am to Dr. PCheri Rous, who verbally acknowledged these results. Electronically Signed   By: SJulian HyM.D.   On: 01/21/2018 02:19   Dg Chest Port 1 View  Result Date: 01/21/2018 CLINICAL DATA:  Hypoxemia today. EXAM: PORTABLE CHEST 1 VIEW COMPARISON:  PA and lateral chest 01/13/2018 and 01/19/2016. FINDINGS: The patient is rotated to the left on the examination. The lungs are clear. Heart size is normal. No pneumothorax or pleural effusion. NG tube is in place with the tip in good position in the fundus of the stomach. IMPRESSION: No acute disease. NG tube in good position. Electronically Signed   By: TInge RiseM.D.   On: 01/21/2018 13:49        Scheduled Meds: . sodium chloride   Intravenous Once  . lactulose  300 mL Rectal BID  . levothyroxine  75 mcg Intravenous Daily  . sodium chloride flush  3 mL Intravenous Q12H   Continuous Infusions: . cefTRIAXone (ROCEPHIN)  IV 2 g (01/21/18 1015)  . pantoprozole (PROTONIX) infusion 8 mg/hr (01/17/2018 2336)  . potassium chloride 10 mEq (01/21/18 1624)     LOS: 1 day    Time spent: 35 minutes.    BElmarie Shiley MD [facility] Pager [phone] If 7PM-7AM, please contact night-coverage [email or web address] Password [facility]9/30/2019, 4:26 PM

## 2018-01-21 NOTE — Consult Note (Signed)
[facility] Reason for consult: Hematemesis Referring Physician: [facility] hospitalist.  Primary care [facility].  Primary GI: None patient is unassigned  Geoffrey Neal is an 72 y.o. male.  HPI: He originally is from [place] and was found to have pancreatic cancer and underwent a pylorus sparing Whipple procedure in September 2018 followed by some chemo and radiation therapy.  We do not currently have any of those results.According to his primary care notes from care anywhere, this was a stage IIb pancreatic cancer T3, N1 M0.  According to the family practice note the surgical margins were positive.  He declined additional chemotherapy and apparently went to [place] for some type of adjuvant chemotherapy that included some herbs.  According to the records and history obtained from his daughter the patient has been complaining of weakness and fatigue nausea and vomiting coffee-ground material and actually fell and hit his head.  His work-up here in the hospital has shown a hemoglobin of 7.7 with the NG tube placed showing coffee-ground material coming from his NG tube.  He has had documented melena.CT scan of his abdomen showed no liver lesions with evidence of pylorus sparing Whipple procedure, marked distention of the stomach with gastric outlet obstruction with a metal stent in place extending from the stomach into the jejunum across the anastomosis with a mass in this region possibly causing obstruction.  In addition he had loculated ascites around the pancreas and the hepatoduodenal ligament measuring 10 cm in diameter causing compression.  There was additional ascites throughout the abdomen.  There was evidence of peritoneal/omental caking on midline abdominal wall.  This was all felt to be consistent with progressive pancreatic cancer.In addition, he had a CT of his head due to the fall and was found to have a left-sided parietal skull fracture with a subdural hematoma  with other fluid collections in the brain.  Patient's mental status has declined since being admitted he is been essentially nonresponsive.  Apparently neurosurgical/neurology consultation has been requested.  Past Medical History:  Diagnosis Date  . Blurred vision    "fluid in head"-   . Complication of anesthesia    " had difficulty waking up."  . Diabetes mellitus without complication ([place])    ?- patient states prediabetes  . DJD (degenerative joint disease)   . Elevated PSA   . Frequency of urination   . GERD (gastroesophageal reflux disease)    occ  . History of kidney stones    yrs ago- patient denies- "it was an Infection".  . Hypothyroidism   . Mixed hyperlipidemia    MILD PER PT  . Nocturia   . Seizures ([place])    x 2 2016- "none since taking Gabapentin"    Past Surgical History:  Procedure Laterality Date  . COLONOSCOPY W/ POLYPECTOMY     x 2  . CRANIOPLASTY N/A 05/22/2014   Procedure: CRANIOPLASTY;  Surgeon: Ashok Pall, MD;  Location: [place];  Service: Neurosurgery;  Laterality: N/A;  Cranioplasty  . CRANIOTOMY Bilateral 05/08/2014   Procedure: BILATERAL FRONTAL PARIETAL CRANIOTOMY TUMOR EXCISION;  Surgeon: Ashok Pall, MD;  Location: [place] [facility];  Service: Neurosurgery;  Laterality: Bilateral;  . CRANIOTOMY N/A 01/12/2016   Procedure: CRANIOTOMY FOR  CEREBRO SPINAL FLUID LEAK;  Surgeon: Ashok Pall, MD;  Location: [place] [facility];  Service: Neurosurgery;  Laterality: N/A;  . HEMATOMA EVACUATION N/A 12/31/2015   Procedure: ASPIRATION OF SUBCUTANEOUS FLUID COLLECTION of Cranial Defect;  Surgeon: Ashok Pall, MD;  Location:  [place] [facility];  Service: Neurosurgery;  Laterality: N/A;  . PLACEMENT OF LUMBAR DRAIN  01/12/2016   Procedure: PLACEMENT OF LUMBAR DRAIN;  Surgeon: Ashok Pall, MD;  Location: [place] [facility];  Service: Neurosurgery;;  . PROSTATE BIOPSY N/A 07/19/2012   Procedure: EUA, PROSTATE BIOPSY TRANSRECTAL ULTRASONIC PROSTATE (TUBP);  Surgeon: Fredricka Bonine,  MD;  Location: [facility];  Service: Urology;  Laterality: N/A;  . RADIOLOGY WITH ANESTHESIA N/A 05/07/2014   Procedure: RADIOLOGY WITH ANESTHESIA/EMBOLIZATION;  Surgeon: Rob Hickman, MD;  Location: [place];  Service: Radiology;  Laterality: N/A;  . TONSILLECTOMY      Family History  Problem Relation Age of Onset  . Diabetes Other     Social History:  reports that he quit smoking about 36 years ago. His smoking use included cigarettes. He quit after 15.00 years of use. He has never used smokeless tobacco. He reports that he does not drink alcohol or use drugs.  Allergies:  Allergies  Allergen Reactions  . Orange Fruit [Citrus] Rash    Medications; Prior to Admission medications   Medication Sig Start Date End Date Taking? Authorizing Provider  ferrous sulfate 325 (65 FE) MG tablet Take 325 mg by mouth daily. 07/17/16 04/21/18 Yes [provider]  Multiple Vitamin (MULTIVITAMIN WITH MINERALS) TABS tablet Take 1 tablet daily by mouth.   Yes [provider]  omeprazole (PRILOSEC OTC) 20 MG tablet Take 40 mg by mouth 2 (two) times daily.   Yes [provider]  PRESCRIPTION MEDICATION Take 100 mcg by mouth daily before breakfast. Levothyroxine 100 mcg tablets from [place]   Yes [provider]  promethazine (PHENERGAN) 12.5 MG tablet Take 12.5 mg by mouth every 6 (six) hours as needed for nausea or vomiting.    Yes [provider]  acetaminophen (TYLENOL) 325 MG tablet Take 1-2 tablets (325-650 mg total) by mouth every 4 (four) hours as needed for mild pain. Patient not taking: Reported on 01/11/2018 06/05/14   Angiulli, Lavon Paganini, PA-C  aspirin 325 MG tablet Take 1 tablet (325 mg total) daily by mouth. Patient not taking: Reported on 12/28/2017 03/02/17   Velvet Bathe, MD  atorvastatin (LIPITOR) 20 MG tablet Take 1 tablet (20 mg total) daily at 6 PM by mouth. Patient not taking: Reported on 12/29/2017 03/01/17   Velvet Bathe, MD   levothyroxine (SYNTHROID, LEVOTHROID) 175 MCG tablet Take 1 tablet (175 mcg total) by mouth daily before breakfast. Patient not taking: Reported on 01/11/2018 06/05/14   Angiulli, Lavon Paganini, PA-C  ondansetron (ZOFRAN) 4 MG tablet Take 1 tablet (4 mg total) by mouth every 8 (eight) hours as needed for nausea or vomiting. Patient not taking: Reported on 01/07/2018 09/29/16   Clayton Bibles, PA-C  oxyCODONE-acetaminophen (PERCOCET/ROXICET) 5-325 MG tablet Take 1-2 tablets by mouth every 4 (four) hours as needed for severe pain. Patient not taking: Reported on 01/19/2018 09/29/16   Clayton Bibles, PA-C   . sodium chloride   Intravenous Once  . levothyroxine  75 mcg Intravenous Daily  . LORazepam  1 mg Intravenous Once  . sodium chloride flush  3 mL Intravenous Q12H   PRN Meds morphine injection Results for orders placed or performed during the hospital encounter of 01/14/2018 (from the past 48 hour(s))  Comprehensive metabolic panel     Status: Abnormal   Collection Time: 01/19/2018  6:48 PM  Result Value Ref Range   Sodium 137 135 - 145 mmol/L   Potassium 3.5 3.5 - 5.1 mmol/L  Chloride 100 98 - 111 mmol/L   CO2 27 22 - 32 mmol/L   Glucose, Bld 110 (H) 70 - 99 mg/dL   BUN 7 (L) 8 - 23 mg/dL   Creatinine, Ser 0.73 0.61 - 1.24 mg/dL   Calcium 7.8 (L) 8.9 - 10.3 mg/dL   Total Protein 6.3 (L) 6.5 - 8.1 g/dL   Albumin 2.4 (L) 3.5 - 5.0 g/dL   AST 204 (H) 15 - 41 U/L   ALT 110 (H) 0 - 44 U/L   Alkaline Phosphatase 1,953 (H) 38 - 126 U/L    Comment: RESULTS CONFIRMED BY MANUAL DILUTION   Total Bilirubin 4.2 (H) 0.3 - 1.2 mg/dL   GFR calc non Af Amer >60 >60 mL/min   GFR calc Af Amer >60 >60 mL/min    Comment: (NOTE) The eGFR has been calculated using the CKD EPI equation. This calculation has not been validated in all clinical situations. eGFR's persistently <60 mL/min signify possible Chronic Kidney Disease.    Anion gap 10 5 - 15    Comment: Performed at [facility] [address],  [place], [place] [address]  CBC     Status: Abnormal   Collection Time: 01/13/2018  6:48 PM  Result Value Ref Range   WBC 7.8 4.0 - 10.5 K/uL   RBC 2.14 (L) 4.22 - 5.81 MIL/uL   Hemoglobin 7.7 (L) 13.0 - 17.0 g/dL   HCT 26.6 (L) 39.0 - 52.0 %   MCV 124.3 (H) 78.0 - 100.0 fL    Comment: REPEATED TO VERIFY  QUESTIONABLE RESULTS SHORT SAMPLE LESS THAN 1 CC SUGGEST RECOLLECT DRAW PROPER MINIMUM SAMPLE VOLUME OF 1 CC TO AVOID DILUTION PROBLEMS AND  COLD AGGLUTININS    MCH 36.0 (H) 26.0 - 34.0 pg   MCHC 28.9 (L) 30.0 - 36.0 g/dL   RDW 22.4 (H) 11.5 - 15.5 %   Platelets 126 (L) 150 - 400 K/uL    Comment: Performed at [facility], [place] [address] [place], [place] [address]  Type and screen [place]     Status: None (Preliminary result)   Collection Time: 12/31/2017  6:48 PM  Result Value Ref Range   ABO/RH(D) B POS    Antibody Screen POS    Sample Expiration 01/23/2018    Antibody Identification ANTI LEA Bobby Rumpf a)    PT AG Type NEGATIVE FOR LEWIS A ANTIGEN    Unit Number [id number]    Blood Component Type RED CELLS,LR    Unit division 00    Status of Unit ISSUED    Donor AG Type NEGATIVE FOR LEWIS A ANTIGEN    Transfusion Status OK TO TRANSFUSE    Crossmatch Result COMPATIBLE    Unit Number [id number]    Blood Component Type RED CELLS,LR    Unit division 00    Status of Unit ISSUED    Donor AG Type NEGATIVE FOR LEWIS A ANTIGEN    Transfusion Status OK TO TRANSFUSE    Crossmatch Result COMPATIBLE   Occult blood card to lab, stool     Status: Abnormal   Collection Time: 01/03/2018  8:04 PM  Result Value Ref Range   Fecal Occult Bld POSITIVE (A) NEGATIVE    Comment: Performed at [facility], [address] [place], [place] [address]  Ammonia     Status: Abnormal   Collection Time: 12/29/2017  9:21 PM  Result Value Ref Range   Ammonia 87 (H) 9 -  35 umol/L    Comment: Performed at [facility], [place] [address], [place], [place] [address]   Protime-INR     Status: None   Collection Time: 01/19/2018  9:21 PM  Result Value Ref Range   Prothrombin Time 14.3 11.4 - 15.2 seconds   INR 1.12     Comment: Performed at [facility] [address] [place], [place] [address]  TSH     Status: Abnormal   Collection Time: 01/01/2018  9:21 PM  Result Value Ref Range   TSH 22.806 (H) 0.350 - 4.500 uIU/mL    Comment: Performed by a 3rd Generation assay with a functional sensitivity of <=0.01 uIU/mL. Performed at [facility], [address], [place], [place] [address]   CBG monitoring, ED     Status: Abnormal   Collection Time: 01/17/2018 10:38 PM  Result Value Ref Range   Glucose-Capillary 118 (H) 70 - 99 mg/dL   Comment 1 Notify RN    Comment 2 Document in Chart   Prepare RBC     Status: None   Collection Time: 01/15/2018 10:41 PM  Result Value Ref Range   Order Confirmation      ORDER PROCESSED BY BLOOD BANK Performed at [facility], [place] [address], [place],  [address]   Urinalysis, Routine w reflex microscopic     Status: Abnormal   Collection Time: 01/21/18  7:56 AM  Result Value Ref Range   Color, Urine AMBER (A) YELLOW    Comment: BIOCHEMICALS MAY BE AFFECTED BY COLOR   APPearance HAZY (A) CLEAR   Specific Gravity, Urine 1.041 (H) 1.005 - 1.030   pH 5.0 5.0 - 8.0   Glucose, UA NEGATIVE NEGATIVE mg/dL   Hgb urine dipstick NEGATIVE NEGATIVE   Bilirubin Urine SMALL (A) NEGATIVE   Ketones, ur NEGATIVE NEGATIVE mg/dL   Protein, ur NEGATIVE NEGATIVE mg/dL   Nitrite NEGATIVE NEGATIVE   Leukocytes, UA NEGATIVE NEGATIVE    Comment: Performed at [facility] [address], [place], [place] [address]  Glucose, capillary     Status: None   Collection Time: 01/21/18  8:54 AM  Result Value Ref Range   Glucose-Capillary 84 70 - 99 mg/dL    Dg Chest 2 View  Result Date: 01/19/2018 CLINICAL DATA:  Weakness.  Vomiting.  Lethargy. EXAM: CHEST - 2 VIEW COMPARISON:  Radiographs 01/19/2016 FINDINGS: Lung  volumes are low. The cardiomediastinal contours are normal. No evidence of pneumomediastinum. The lungs are clear. Pulmonary vasculature is normal. No consolidation, pleural effusion, or pneumothorax. No acute osseous abnormalities are seen. IMPRESSION: Low lung volumes without acute chest finding. Electronically Signed   By: Keith Rake M.D.   On: 01/15/2018 21:02   Ct Head Wo Contrast  Result Date: 01/21/2018 CLINICAL DATA:  Witnessed fall. Struck posterior head. History of seizures and resected meningioma. EXAM: CT HEAD WITHOUT CONTRAST TECHNIQUE: Contiguous axial images were obtained from the base of the skull through the vertex without intravenous contrast. COMPARISON:  MRI head March 01, 2017 and CT HEAD March 01, 2017. FINDINGS: BRAIN: Small volume RIGHT frontotemporal subarachnoid hemorrhage. RIGHT mesial frontal lobe encephalomalacia with acute hemorrhage which may be intraparenchymal or extra-axial. Trace LEFT parietal extra-axial hemorrhage. No midline shift, mass effect or acute large vascular territory infarcts. Stable low-density convexity fluid collection with trace acute blood products. Basal cisterns are patent. VASCULAR: Moderate calcific atherosclerosis of the carotid siphons. SKULL: Acute nondisplaced LEFT parietal skull fracture. 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 Moderate LEFT parietal scalp hematoma. Moderate temporomandibular osteoarthrosis. SINUSES/ORBITS: Mild paranasal sinus mucosal thickening. Mastoid air cells are well aerated.The included ocular globes and orbital contents are non-suspicious. OTHER: None. IMPRESSION: 1. Acute nondisplaced LEFT parietal skull fracture. Trace subjacent epidural versus subdural hematoma. Small volume RIGHT frontotemporal subarachnoid hemorrhage. 2. RIGHT frontal intraparenchymal versus extra-axial blood products superimposed on RIGHT frontal encephalomalacia. 3. 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 4. Critical Value/emergent results were called by telephone at the time of interpretation on 01/21/2018 at 2:59 am to [employer] , who verbally acknowledged these results. Electronically Signed   By: Elon Alas M.D.   On: 01/21/2018 01:29   Ct Abdomen Pelvis W Contrast  Result Date: 01/21/2018 CLINICAL DATA:  Metastatic pancreatic cancer, status post pylorus sparing pancreaticoduodenectomy, radiation, and chemotherapy, with weakness and vomiting. EXAM: CT ABDOMEN AND PELVIS WITH CONTRAST TECHNIQUE: Multidetector CT imaging of the abdomen and pelvis was performed using the standard protocol following bolus administration of intravenous contrast. CONTRAST:  38m OMNIPAQUE IOHEXOL 300 MG/ML  SOLN COMPARISON:  09/29/2016. FINDINGS: Lower chest: Linear scarring in the right lower lobe. Hepatobiliary: No suspicious hepatic lesions. Status post cholecystectomy with suspected hepaticojejunostomy. Mild central intrahepatic ductal prominence. Pancreas: Status post resection of the pancreatic head/uncinate process. Mild prominence of the main pancreatic duct, measuring 4 mm (series 3/image 32). Spleen: Within normal limits. Adrenals/Urinary Tract: Adrenal glands within normal limits. Small bilateral renal cysts, measuring up to 12 mm in the left lower pole (series 3/image 46). No hydronephrosis. Bladder is within normal limits. Stomach/Bowel: Distended stomach, suggesting some degree of gastric outlet obstruction. The patient is status post pylorus sparing Whipple with metallic stent extending from the gastric antrum to the jejunum (across the anastomosis). However, there is masslike thickening in this region, likely reflecting tumor (series 3/image 34). No evidence of bowel obstruction. Appendix is not discretely visualized. Moderate left colonic stool burden, suggesting constipation.  Vascular/Lymphatic: No evidence of abdominal aortic aneurysm. Atherosclerotic calcifications of the abdominal aorta and branch vessels. No suspicious abdominopelvic lymphadenopathy. Reproductive: Prostate is unremarkable. Other: Loculated ascites along the hepatoduodenal ligament, measuring 10.5 x 7.6 cm, with mass effect on adjacent structures (series 3/image 19), malignant. Additional abdominopelvic ascites. Peritoneal disease/omental caking, for example beneath the midline anterior abdominal wall (series 3/image 36). Musculoskeletal: Mild degenerative changes of the lumbar spine. IMPRESSION: Status post pylorus sparing Whipple. Metallic stent extending from the gastric antrum to the jejunum. Distended stomach, suggesting some degree of gastric outlet obstruction. Associated masslike thickening in the region of the gastric antrum/residual duodenum, reflecting tumor. Overlying peritoneal disease/omental caking beneath the anterior abdominal wall. Loculated malignant ascites with mass effect in the right upper abdomen. No suspicious hepatic lesions. Mild central intrahepatic ductal prominence. These results were called by telephone at the time of interpretation on 01/21/2018 at 2:10 am to Dr. PCheri Rous, who verbally acknowledged these results. Electronically Signed   By: SJulian HyM.D.   On: 01/21/2018 02:19   ROS: Not obtainable           Blood pressure 122/84, pulse 79, temperature 98.9 F (37.2 C), temperature source Oral, resp. rate 16, weight 66 kg, SpO2 100 %.  Physical exam:   General--patient in bed with mask.  Slightly labored breathing and completely nonresponsive. ENT--slight icterus  Heart--normal S1 and S2 Lungs--slightly labored respiration Abdomen--nontender nondistended midline scar well-healed Psych--nonresponsive to verbal stimuli or to abdominal palpation  Assessment: 1.  Hematemesis.  This is likely due to invasive pancreatic cancer.  There is evidence  of a stent across the anastomosis which indicates the tumor has recurred and invaded the residual tissue with compression.  At this point with all of his other problems, I would not offer EGD. 2.  Recurrent pancreatic cancer.  Signs on CT of malignant ascites, invasion of the bowel and peritoneum. 3.  Evidence of subdural hematoma and intracranial bleeding following fall with patient hitting his head.  He is not responsive at all this morning  Plan: 1.  I would recommend conservative therapy in him with empiric IV PPI.  Would not perform EGD unless absolutely required. 2.  Suggest oncology consult to discuss with the family alternatives and make a decision about how aggressive to be.  We will follow with you.   Nancy Fetter 01/21/2018, 9:10 AM   This note was created using voice recognition software and minor errors may Have occurred unintentionally. Pager: [phone] If no answer or after hours call [phone]

## 2018-01-21 NOTE — ED Notes (Signed)
Pt placed on NRB O2 sats 84% on 4 L [place]. Pt breathing with mouth open.

## 2018-01-21 NOTE — Procedures (Signed)
ELECTROENCEPHALOGRAM REPORT   Patient: Geoffrey Neal       Room #: 6N17C EEG No. ID: [id number] Age: 72 y.o.        Sex: male Referring Physician: Regalado Report Date:  01/21/2018        Interpreting Physician: Alexis Goodell  History: [name] is an 72 y.o. male with altered mental status  Medications:  Synthroid, Rocephin, Lactulose, Protonix  Conditions of Recording:  This is a 21 channel routine scalp EEG performed with bipolar and monopolar montages arranged in accordance to the international 10/20 system of electrode placement. One channel was dedicated to EKG recording.  The patient is in the obtunded state.  Description:  The background activity is slow and poorly organized.  It consists of a low voltage, poorly organized polymorphic delta activity.  This slow activity is diffusely distributed.  It is continuous throughout the recording.   No epileptiform activity is noted.   Hyperventilation and intermittent photic stimulation were not performed.   IMPRESSION: This is an abnormal EEG secondary to general background slowing.  This finding may be seen with a diffuse disturbance that is etiologically nonspecific, but may include a metabolic encephalopathy, among other possibilities.  No epileptiform activity was noted.     Alexis Goodell, MD Neurology [phone] 01/21/2018, 5:56 PM

## 2018-01-21 NOTE — Progress Notes (Signed)
Pt has been only responding to pain most of the day. About 1730 pt opened eyes, moved himself from left side to right side by himself. Lactulose given clamped 60 mins.. At which time he again opened his eyes and made a few noises  when seeing family.  Will continue to monitor. Family at bedside.

## 2018-01-21 NOTE — Consult Note (Signed)
Neurosurgery Consultation  Reason for Consult: Closed head trauma Referring Physician: Regalado  CC: Fall  HPI: This is a 72 y.o. man with a h/o pancreaticobiliary ca that presented to the ED with weakness & N/V. He was in the ED and had a witnessed fall in front of his daughter. He has a h/o Sz and a R F meningioma resection with craniectomy and then cranioplasty. From review of charting, prior to the fall, it appears that he was following commands and answering questions, but was confused. Since immediately after the fall, it seems that he has been obtunded. No report of seizure-like activity. Pt is currently obtunded and not able to provide any further history.    ROS: A 14 point ROS was performed and is negative except as noted in the HPI and limited by patient's poor level of consciousness.   PMHx:  Past Medical History:  Diagnosis Date  . Blurred vision    "fluid in head"-   . Complication of anesthesia    " had difficulty waking up."  . Diabetes mellitus without complication ([place])    ?- patient states prediabetes  . DJD (degenerative joint disease)   . Elevated PSA   . Frequency of urination   . GERD (gastroesophageal reflux disease)    occ  . History of kidney stones    yrs ago- patient denies- "it was an Infection".  . Hypothyroidism   . Mixed hyperlipidemia    MILD PER PT  . Nocturia   . Seizures ([place])    x 2 2016- "none since taking Gabapentin"   FamHx:  Family History  Problem Relation Age of Onset  . Diabetes Other    SocHx:  reports that he quit smoking about 36 years ago. His smoking use included cigarettes. He quit after 15.00 years of use. He has never used smokeless tobacco. He reports that he does not drink alcohol or use drugs.  Exam: Vital signs in last 24 hours: Temp:  [97.6 F (36.4 C)-99.1 F (37.3 C)] 98.9 F (37.2 C) (09/30 0820) Pulse Rate:  [73-107] 79 (09/30 0820) Resp:  [14-21] 16 (09/30 0820) BP: (88-124)/(66-94) 122/84 (09/30  0820) SpO2:  [93 %-100 %] 100 % (09/30 0820) Weight:  [66 kg] 66 kg (09/30 0344) General: Awake, alert, cooperative, lying in bed in NAD Head: normocephalic, mild scalp abrasion HEENT: neck supple Skin: prior crani incision completely healed with scar, no exposed hardware, mild scalp abrasion Pulmonary: on non-rebreather, some accessory muscle use, sat'ing 93% Cardiac: RRR Extremities: warm and well perfused x4 Neuro: Eyes closed, grimaces to noxious stimulus, gaze upward and mildly dysconjugate, +mild scleral icterus, does not answer questions in spanish or follow commands in english or spanish, withdraws x4 to noxious stimulus with mild right preference   Assessment and Plan: 72 y.o. man with CBD cancer s/p witnessed fall. [place] personally reviewed, which shows post-op changes, embo material, synthetic cranioplasty, a linear non-displaced parietal skull fracture, pseudomeningocele, paramedian frontal / resection cavity blood products likely subdural hematoma, tSAH along cranioplasty edge frontally, right anterior temporal tip hemorrhage. Coags WNL, no report of anticoagulants / anti-PLT drugs.   -no acute surgical intervention indicated at this time -decline in exam is concerning and out of proportion to imaging findings, GCS currently 7 (E1M4V2), prior scan performed 9h ago, stat repeat CTH to rule out structural cause of mental status.  -If findings are stable, recommend further workup of patient's encephalopathy, currently on non-rebreather and GCS of 7. Appears from charting that patient is  DNR, DNI. -please call with any concerns or questions  Judith Part, MD 01/21/18 8:50 AM [facility]

## 2018-01-21 NOTE — Plan of Care (Signed)
Hospital medicine plan of care  After admission complete, I was alerted by emergency medicine team that the patient had a fall. He was evaluated by emergency medicine team, no new neurologic deficit noted.  CT head wo contrast performed revealed small SAH in right frontal region, left parietal skull fracture, non-displaced.  He received lorazepam for nausea at 2300. Upon my re-evaluation, he is somnolent, withdraws to pain, pupils b/l round and reactive to light.  CT of abdomen and pelvis reviewed with radiologist - suggestive of worsening disease in abdomen related to malignancy, gastric outlet obstruction.  Plan: -For symptomatic gastric outlet obstruction NGT ordered to decompress -For small SAH and non-displaced parietal skull fracture, anticipate non-operative management, but have reached out to neurosurgery to confirm such, a colleague of mine anticipating a call back -For encephalopathy, likely multifactorial (lorazepam, acute illness, possibly hepatic encephalopathy with elevated ammonia) - daughter prefers to wait on lactulose, allow for lorazepam effects to wane, and continue to re-assess  Geoffrey Neal

## 2018-01-21 NOTE — Progress Notes (Signed)
Patient arrived from ED. Not alert but responds to pain; on NRB SPO2 100%. 1 of 2 units of RBC running. Will continue to monitor.

## 2018-01-21 NOTE — Progress Notes (Signed)
EEG completed; results pending.    

## 2018-01-21 NOTE — Progress Notes (Signed)
Patient had an approximate 60 cc extravasation in Left Antecubital site.Contrast was stopped when this technologist felt arm to begin to come tight. Patient was non-communicative of pain/other. Dr. Elana Alm, Radiologist, checked the arm post extravasation. Post extravasation orders entered in Epic. Extravasation was communicated to patient's RN.Arnell Asal at 01:25.  This technologist started another IV in Right [facility] and exam was completed.

## 2018-01-22 DIAGNOSIS — Z515 Encounter for palliative care: Secondary | ICD-10-CM

## 2018-01-22 DIAGNOSIS — Z7189 Other specified counseling: Secondary | ICD-10-CM

## 2018-01-22 LAB — GLUCOSE, CAPILLARY
GLUCOSE-CAPILLARY: 85 mg/dL (ref 70–99)
GLUCOSE-CAPILLARY: 100 mg/dL — AB (ref 70–99)
Glucose-Capillary: 167 mg/dL — ABNORMAL HIGH (ref 70–99)
Glucose-Capillary: 69 mg/dL — ABNORMAL LOW (ref 70–99)
Glucose-Capillary: 78 mg/dL (ref 70–99)
Glucose-Capillary: 89 mg/dL (ref 70–99)

## 2018-01-22 LAB — TYPE AND SCREEN
ABO/RH(D): B POS
Antibody Screen: POSITIVE
Donor AG Type: NEGATIVE
Donor AG Type: NEGATIVE
PT AG Type: NEGATIVE
Unit division: 0
Unit division: 0

## 2018-01-22 LAB — BPAM RBC
Blood Product Expiration Date: 201911012359
Blood Product Expiration Date: 201911012359
ISSUE DATE / TIME: 201909300519
ISSUE DATE / TIME: 201909300236
UNIT TYPE AND RH: 7300
Unit Type and Rh: 7300

## 2018-01-22 MED ORDER — MORPHINE SULFATE (PF) 2 MG/ML IV SOLN: 1.0000 mg | INTRAVENOUS | Status: DC | PRN

## 2018-01-22 MED ORDER — DEXTROSE 50 % IV SOLN
INTRAVENOUS | Status: AC
  Filled 2018-01-22: qty 50

## 2018-01-22 MED ORDER — MORPHINE SULFATE (CONCENTRATE) 10 MG/0.5ML PO SOLN: 5.0000 mg | ORAL | Status: DC | PRN

## 2018-01-22 MED ORDER — DEXTROSE-NACL 5-0.9 % IV SOLN
INTRAVENOUS | Status: DC
  Administered 2018-01-22: 11:00:00 via INTRAVENOUS

## 2018-01-22 MED ORDER — HALOPERIDOL LACTATE 5 MG/ML IJ SOLN: 0.5000 mg | INTRAMUSCULAR | Status: DC | PRN

## 2018-01-22 MED ORDER — PANTOPRAZOLE SODIUM 40 MG IV SOLR
40.0000 mg | Freq: Two times a day (BID) | INTRAVENOUS | Status: DC
  Administered 2018-01-22 – 2018-01-27 (×10): 40 mg via INTRAVENOUS
  Filled 2018-01-22 (×14): qty 40

## 2018-01-22 MED ORDER — LORAZEPAM 2 MG/ML IJ SOLN
1.0000 mg | INTRAMUSCULAR | Status: DC | PRN
  Administered 2018-01-23 – 2018-01-24 (×2): 1 mg via INTRAVENOUS
  Filled 2018-01-22 (×2): qty 1

## 2018-01-22 MED ORDER — DEXTROSE 50 % IV SOLN
50.0000 mL | Freq: Once | INTRAVENOUS | Status: AC
  Administered 2018-01-22: 50 mL via INTRAVENOUS

## 2018-01-22 MED ORDER — ONDANSETRON HCL 4 MG/2ML IJ SOLN
4.0000 mg | Freq: Four times a day (QID) | INTRAMUSCULAR | Status: DC | PRN
  Administered 2018-01-22 – 2018-01-26 (×2): 4 mg via INTRAVENOUS
  Filled 2018-01-22 (×2): qty 2

## 2018-01-22 NOTE — Progress Notes (Signed)
[facility] notified that IV team wasn't able to restart IV for protonic gtt/ Arthor Captain LPN

## 2018-01-22 NOTE — Plan of Care (Signed)
  Problem: Pain Managment: Goal: General experience of comfort will improve Outcome: Progressing   Problem: Elimination: Goal: Will not experience complications related to bowel motility Outcome: Progressing   

## 2018-01-22 NOTE — Progress Notes (Signed)
Patient has been seen by oncology. Who has discussed the overall situation with the patient's family and a decision is been made for palliative care and comfort only.  I do not feel that he would benefit from EGD.  We will sign off please call us if things change.

## 2018-01-22 NOTE — Progress Notes (Signed)
Hypoglycemic Event  CBG: 69  Treatment: amp D50  Symptoms: none  Follow-up CBG: Time:0547 CBG Result:167  Possible Reasons for Event: NPO  Comments/MD notified:yes    Jule Economy

## 2018-01-22 NOTE — Progress Notes (Signed)
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  He presents after just coming back to the [place] yesterday with a constellation of symptoms including fatigue, weakness, nausea, vomiting, coffee-ground emesis, declining functional status.   Primary medical oncologist Dr. Georgiann Cocker, of [facility].  A Port-A-Cath was placed in anticipation for systemic adjuvant therapy, however the patient ultimately was concerned about toxicity associated with systemic therapy and had the Port-A-Cath removed.  He denies any gross bleeding, reports weight loss as well as symptoms mentioned above.  Has not required a blood transfusion.  Reports that alternative treatments received in [place] include mistletoe.  He reports he does understand that his oncologic diagnosis is terminal will be the cause of his death.  He does report that if anticancer therapy were offered, he would be interested in taking therapy and a palliative intent.  ED Course: In the emergency room vital signs remarkable for heart rate of 100 and normalized prior to admission, blood pressure with systolic ranging from 559 - 113.  Labs reveal hemoglobin 7.7, macrocytic with MCV of 124, platelet count 120.  CMP with gross derangements of hepatic function including, alk phos of 1900, total bilirubin 4.2, AST 204, ALT 110, albumin 2.4.  Creatinine normal.  Chest x-ray without acute findings.  Emergency medicine team reports to perform rectal exam with melena.  Due to concern about acute GI bleed, given IV PPI, gastroenterology consulted for consideration of endoscopic  evaluation-see report they will evaluate the patient in a.m.  Hospital medicine consulted for further management.  Patient fell in the ED, CT head showed; subdural Hematoma, SAH, and non displaced parietal skull fracture.   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  Repeated CT head stable. Neurosurgery recommend further work up for encephalopathy if CT stable. Exam out of proportion for CT head finding. Patient didn't received sedatives. Ammonia level is elevated at 80. Start lactulose enema.  -EEG negative for seizure.  -continue with antibiotics to cover for SBP prophylaxis.  -CO 2 on B-met at 27, not elevated. For hypoxemia, repeated  chest x ray.  -Patient is DNR, DNI.  -IV synthroid.  -He is alert today, answering  questions.   2-GI bleed, coffee ground emesis;  On IV protonix  BID Has NG tube.  CT abdomen with gastric outlet obstruction.  GI consulted.  Received 2 units PRBC.   3-Acute blood loss anemia;  Related to GI bleed, related to malignancy.  Received 2 units PRBC>  Hb stable at 10/    4-Pancreaticobiliary cancer;  treated with a Whipple in September 2018, followed by capecitabine + radiation, CT ; Metallic stent extending from the gastric antrum to the jejunum. Distended stomach, suggesting some degree of gastric outlet obstruction. Associated masslike thickening in the region of the gastric antrum/residual duodenum, reflecting tumor. Overlying peritoneal disease/omental caking beneath the anterior abdominal wall. Loculated malignant ascites with mass effect in the  right upper abdomen. -Oncology consulted. Recommended comfort care, hospice  Transaminases, hyperbilirubinemia, related to  malignancy  Awaiting palliative, care meeting.  Discussed with Dr Burr Medico today 10-01 , patient is not candidate for chemo, at time of discharge could remove NGT if not significant drainage and patient should be on clear diet.   5-Acute hypoxic respiratory failure;  On NB. Taper off oxygen as possible.  Repeated  chest x ray, clear.  Support.  DNR, DNI>   6-Hypokalemia; replete IV   7-Hypothyroidism; IV synthroid.   8-Hypoglycemia;  IV fluids with D 5, NS.   DVT prophylaxis: SCD. No anticoagulation give head bleed.  Code Status: DNR Family Communication: Son at bedside, updated provided. Plan for palliative care meeting.  Disposition Plan: remain in the hospital for further care of multiple medical problems, GI bleed, SDH, Encephalopathy. Awaiting palliative care meeting to transition to comfort care.   Consultants:   Oncology  Neurosurgery.   Gastroenterologist    Procedures:   EEG; negative for seizure.    Antimicrobials:  Ceftriaxone, to cover for SBP.    Subjective: Alert, asking for something to drink.  Denies headaches. He was able to tell me he is in the hospital, recognized his son.    Objective: Vitals:   01/21/18 1432 01/21/18 1438 01/21/18 2055 01/22/18 0536  BP:   93/66 114/73  Pulse:   78 74  Resp:   14 16  Temp:   97.6 F (36.4 C) 97.7 F (36.5 C)  TempSrc:   Oral Oral  SpO2: (!) 88% 97% 100% 100%  Weight:        Intake/Output Summary (Last 24 hours) at 01/22/2018 0859 Last data filed at 01/22/2018 0700 Gross per 24 hour  Intake 567.48 ml  Output 1450 ml  Net -882.52 ml   Filed Weights   01/21/18 0344  Weight: 66 kg    Examination:  General exam: Alert.  Respiratory system: normal respiratory effort, no crackles.  Cardiovascular system: S 1, S 2 RRR Gastrointestinal system: BS present, soft, nt Central nervous system: alert, answering questions.  Extremities: trace edema     Data Reviewed: I have personally reviewed  following labs and imaging studies  CBC: Recent Labs  Lab 01/10/2018 1848 01/21/18 1132 01/21/18 1916  WBC 7.8 7.4 7.6  NEUTROABS  --  7.2  --   HGB 7.7* 10.2* 10.2*  HCT 26.6* 30.5* 29.9*  MCV 124.3* 97.1 96.1  PLT 126* 117* 98*   Basic Metabolic Panel: Recent Labs  Lab 01/15/2018 1848 01/21/18 1132  NA 137 138  K 3.5 3.2*  CL 100 97*  CO2 27 27  GLUCOSE 110* 96  BUN 7* 10  CREATININE 0.73 0.74  CALCIUM 7.8* 7.3*   GFR: CrCl cannot be calculated (Unknown ideal weight.). Liver Function Tests: Recent Labs  Lab 01/11/2018 1848 01/21/18 1132  AST 204* 160*  ALT 110* 96*  ALKPHOS 1,953* 1,583*  BILITOT 4.2* 4.4*  PROT 6.3* 5.8*  ALBUMIN 2.4* 2.1*   No results for input(s): LIPASE, AMYLASE in the last 168 hours. Recent Labs  Lab 01/19/2018 2121 01/21/18 1132  AMMONIA 87* 80*   Coagulation Profile: Recent Labs  Lab 12/31/2017 2121  INR 1.12   Cardiac Enzymes: No results for input(s): CKTOTAL, CKMB, CKMBINDEX, TROPONINI in the last 168 hours. BNP (last 3 results) No results for input(s): PROBNP in the last 8760 hours. HbA1C: No results for input(s): HGBA1C in the last 72 hours. CBG: Recent Labs  Lab 01/21/18 2007 01/22/18 0023  01/22/18 0526 01/22/18 0547 01/22/18 0808  GLUCAP 83 78 69* 167* 89   Lipid Profile: No results for input(s): CHOL, HDL, LDLCALC, TRIG, CHOLHDL, LDLDIRECT in the last 72 hours. Thyroid Function Tests: Recent Labs    01/09/2018 2121  TSH 22.806*   Anemia Panel: Recent Labs    01/21/18 1132  VITAMINB12 1,136*  FERRITIN 4,933*  TIBC 195*  IRON 68   Sepsis Labs: No results for input(s): PROCALCITON, LATICACIDVEN in the last 168 hours.  No results found for this or any previous visit (from the past 240 hour(s)).       Radiology Studies: Dg Chest 2 View  Result Date: 12/26/2017 CLINICAL DATA:  Weakness.  Vomiting.  Lethargy. EXAM: CHEST - 2 VIEW COMPARISON:  Radiographs 01/19/2016 FINDINGS: Lung volumes are low. The  cardiomediastinal contours are normal. No evidence of pneumomediastinum. The lungs are clear. Pulmonary vasculature is normal. No consolidation, pleural effusion, or pneumothorax. No acute osseous abnormalities are seen. IMPRESSION: Low lung volumes without acute chest finding. Electronically Signed   By: Keith Rake M.D.   On: 01/11/2018 21:02   Ct Head Wo Contrast  Result Date: 01/21/2018 CLINICAL DATA:  Patient unresponsive. Status post fall with an intracranial hemorrhage yesterday. EXAM: CT HEAD WITHOUT CONTRAST TECHNIQUE: Contiguous axial images were obtained from the base of the skull through the vertex without intravenous contrast. COMPARISON:  Head CT scan 01/21/2018. FINDINGS: Brain: Small volume of subarachnoid hemorrhage in the right frontal and temporal lobes is again seen and unchanged. Trace amount of subdural hemorrhage over the high left parietal convexities is also again seen. Right frontal lobe encephalomalacia with a small amount of intraparenchymal and/or extra-axial hemorrhage along the superior aspect of the frontal lobe are stable in appearance. Vascular: Atherosclerosis noted. Skull: Nondisplaced left parietal bone fracture is again seen. 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 Sinuses/Orbits: Minimal mucosal thickening right maxillary sinus is seen. Otherwise negative. Other: None. IMPRESSION: No new abnormality since the prior head CT. Nondisplaced left parietal bone fracture, trace amount of subarachnoid hemorrhage on the right and small volume of subdural or epidural hemorrhage subjacent to the patient's fracture all appear unchanged. Postoperative change on the right with right frontal encephalomalacia. Small amount intraparenchymal and or extra-axial hemorrhage overlying seen encephalomalacia appear unchanged. Electronically Signed   By: Inge Rise M.D.   On: 01/21/2018 11:06   Ct Head Wo Contrast  Result Date:  01/21/2018 CLINICAL DATA:  Witnessed fall. Struck posterior head. History of seizures and resected meningioma. EXAM: CT HEAD WITHOUT CONTRAST TECHNIQUE: Contiguous axial images were obtained from the base of the skull through the vertex without intravenous contrast. COMPARISON:  MRI head March 01, 2017 and CT HEAD March 01, 2017. FINDINGS: BRAIN: Small volume RIGHT frontotemporal subarachnoid hemorrhage. RIGHT mesial frontal lobe encephalomalacia with acute hemorrhage which may be intraparenchymal or extra-axial. Trace LEFT parietal extra-axial hemorrhage. No midline shift, mass effect or acute large vascular territory infarcts. Stable low-density convexity fluid collection with trace acute blood products. Basal cisterns are patent. VASCULAR: Moderate calcific atherosclerosis of the carotid siphons. SKULL: Acute nondisplaced LEFT parietal skull fracture. 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 Moderate LEFT parietal scalp hematoma. Moderate temporomandibular osteoarthrosis. SINUSES/ORBITS: Mild paranasal sinus mucosal thickening. Mastoid air cells are well aerated.The included ocular globes and orbital contents are non-suspicious. OTHER: None. IMPRESSION: 1. Acute nondisplaced LEFT parietal skull fracture. Trace subjacent epidural versus subdural hematoma. Small volume RIGHT frontotemporal subarachnoid hemorrhage. 2. RIGHT frontal intraparenchymal  versus extra-axial blood products superimposed on RIGHT frontal encephalomalacia. 3. 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 4. Critical Value/emergent results were called by telephone at the time of interpretation on 01/21/2018 at 0:16 am to [place] , who verbally acknowledged these results. Electronically Signed   By: Elon Alas M.D.   On: 01/21/2018 01:29   Ct Abdomen Pelvis W Contrast  Result Date:  01/21/2018 CLINICAL DATA:  Metastatic pancreatic cancer, status post pylorus sparing pancreaticoduodenectomy, radiation, and chemotherapy, with weakness and vomiting. EXAM: CT ABDOMEN AND PELVIS WITH CONTRAST TECHNIQUE: Multidetector CT imaging of the abdomen and pelvis was performed using the standard protocol following bolus administration of intravenous contrast. CONTRAST:  38m OMNIPAQUE IOHEXOL 300 MG/ML  SOLN COMPARISON:  09/29/2016. FINDINGS: Lower chest: Linear scarring in the right lower lobe. Hepatobiliary: No suspicious hepatic lesions. Status post cholecystectomy with suspected hepaticojejunostomy. Mild central intrahepatic ductal prominence. Pancreas: Status post resection of the pancreatic head/uncinate process. Mild prominence of the main pancreatic duct, measuring 4 mm (series 3/image 32). Spleen: Within normal limits. Adrenals/Urinary Tract: Adrenal glands within normal limits. Small bilateral renal cysts, measuring up to 12 mm in the left lower pole (series 3/image 46). No hydronephrosis. Bladder is within normal limits. Stomach/Bowel: Distended stomach, suggesting some degree of gastric outlet obstruction. The patient is status post pylorus sparing Whipple with metallic stent extending from the gastric antrum to the jejunum (across the anastomosis). However, there is masslike thickening in this region, likely reflecting tumor (series 3/image 34). No evidence of bowel obstruction. Appendix is not discretely visualized. Moderate left colonic stool burden, suggesting constipation. Vascular/Lymphatic: No evidence of abdominal aortic aneurysm. Atherosclerotic calcifications of the abdominal aorta and branch vessels. No suspicious abdominopelvic lymphadenopathy. Reproductive: Prostate is unremarkable. Other: Loculated ascites along the hepatoduodenal ligament, measuring 10.5 x 7.6 cm, with mass effect on adjacent structures (series 3/image 19), malignant. Additional abdominopelvic ascites. Peritoneal  disease/omental caking, for example beneath the midline anterior abdominal wall (series 3/image 36). Musculoskeletal: Mild degenerative changes of the lumbar spine. IMPRESSION: Status post pylorus sparing Whipple. Metallic stent extending from the gastric antrum to the jejunum. Distended stomach, suggesting some degree of gastric outlet obstruction. Associated masslike thickening in the region of the gastric antrum/residual duodenum, reflecting tumor. Overlying peritoneal disease/omental caking beneath the anterior abdominal wall. Loculated malignant ascites with mass effect in the right upper abdomen. No suspicious hepatic lesions. Mild central intrahepatic ductal prominence. These results were called by telephone at the time of interpretation on 01/21/2018 at 2:10 am to Dr. PCheri Rous, who verbally acknowledged these results. Electronically Signed   By: SJulian HyM.D.   On: 01/21/2018 02:19   Dg Chest Port 1 View  Result Date: 01/21/2018 CLINICAL DATA:  Hypoxemia today. EXAM: PORTABLE CHEST 1 VIEW COMPARISON:  PA and lateral chest 12/29/2017 and 01/19/2016. FINDINGS: The patient is rotated to the left on the examination. The lungs are clear. Heart size is normal. No pneumothorax or pleural effusion. NG tube is in place with the tip in good position in the fundus of the stomach. IMPRESSION: No acute disease. NG tube in good position. Electronically Signed   By: TInge RiseM.D.   On: 01/21/2018 13:49        Scheduled Meds: . sodium chloride   Intravenous Once  . lactulose  300 mL Rectal BID  . levothyroxine  75 mcg Intravenous Daily  . pantoprazole (PROTONIX) IV  40 mg Intravenous Q12H  .  sodium chloride flush  3 mL Intravenous Q12H   Continuous Infusions: . cefTRIAXone (ROCEPHIN)  IV 2 g (01/21/18 1015)  . dextrose 5 % and 0.9% NaCl       LOS: 2 days    Time spent: 35 minutes.    Elmarie Shiley, MD [facility] Pager [phone]  If 7PM-7AM, please  contact night-coverage [email or web address] Password [email or web address] 01/22/2018, 8:59 AM

## 2018-01-22 NOTE — Progress Notes (Signed)
Nutrition Brief Note  Chart reviewed. Pt now transitioning to comfort care.  No further nutrition interventions warranted at this time.  Please re-consult as needed.   Sebastion Jun A. Ramonte Mena, RD, LDN, CDE Pager: [phone] After hours Pager: [phone]  

## 2018-01-22 NOTE — Consult Note (Signed)
Consultation Note Date: 01/22/2018   Patient Name: Geoffrey Neal  DOB: July 05, 1945  MRN: [id number]  Age / Sex: 71 y.o., male  PCP: Medicine, [facility] Referring Physician: Elmarie Shiley, MD  Reason for Consultation: Establishing goals of care and Terminal Care  HPI/Patient Profile: 72 y.o. 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 Workup revealed gastric outlet obstruction with progressing pancreatic cancer, elevated transaminases for which he has received lactulose enema. Additionally, he fell in the ED after becoming syncopal and was found to have small SDH and parietal bone fracture. He has been unresponsive- but this was not felt to be related to the SDH. After consulting with Oncology and GI- family has made decision to transition to comfort care measures. Palliative medicine consulted for [place] and symptom management.      Clinical Assessment and Goals of Care:  I have reviewed medical records including EPIC notes, labs and imaging, assessed the patient and then met separately with the patient's spouse, daughter, son and grandson to discuss diagnosis prognosis, GOC, EOL wishes, disposition and options.  I introduced Palliative Medicine as specialized medical care for people living with serious illness. It focuses on providing relief from the symptoms and stress of a serious illness. The goal is to improve quality of life for both the patient and the family.  We discussed a brief life review of the patient. His wife notes that he had previously been very healthy and strong. She states the last several months have been very  hard watching him decline.   As far as functional and nutritional status- he has stopped eating and drinking- but does ask for his favorite peach drink. He doesn't complain too much of pain, just nausea- but they feel this is relieved with the NG tube and medications.     We discussed their current illness and what it means in the larger context of their on-going co-morbidities.  Natural disease trajectory and expectations at EOL were discussed. Their wish is for patient to experience comfort, no suffering and to find healing in death.   I attempted to elicit values and goals of care important to the patient.  While they know the patient would prefer to die at home, they feel he would be better cared for here in the hospital, or at a hospice house.   The difference between aggressive medical intervention and comfort care was considered in light of the patient's goals of care. We discussed IV fluids, antibiotics that patient is still receiving, vs comfort sips and medications only for comfort. Family does not wish to prolong patient's overall trajectory. They wish to transition to full comfort measures only. They request NG stay in place as they feel this is providing a great deal of comfort.   Advanced directives, concepts specific to code status, artifical  feeding and hydration, and rehospitalization were considered and discussed. Patient is DNR. No artificial feeding. Will discontinue artificial hydration. We discussed other ways to manage symptoms of dehydration at EOL.   Hospice and Palliative Care services outpatient were explained and offered.  Questions and concerns were addressed.  The family was encouraged to call with questions or concerns.    Primary Decision Maker NEXT OF KIN- patient's spouse    SUMMARY OF RECOMMENDATIONS -Transition to full comfort care -D/C labs, IV fluids, antibiotics -Comfort medications as ordered -Continue NG tube -Allow for sips of liquids for comfort  per patient/family request -Will re-eval patient status in the morning to see if he would survive transfer to hospice facility    Code Status/Advance Care Planning:  DNR  Palliative Prophylaxis:   Frequent Pain Assessment  Additional Recommendations (Limitations, Scope, Preferences):  Full Comfort Care  Prognosis:    < 2 weeks possibly less  Discharge Planning: To Be Determined hospital death vs hospice house  Primary Diagnoses: Present on Admission: . Hypothyroidism . 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 The following aspects are pertinent.  Past Medical History:  Diagnosis Date  . Blurred vision    "fluid in head"-   . Complication of anesthesia    " had difficulty waking up."  . Diabetes mellitus without complication ([place])    ?- patient states prediabetes  . DJD (degenerative joint disease)   . Elevated PSA   . Frequency of urination   . GERD (gastroesophageal reflux disease)    occ  . History of kidney stones    yrs ago- patient denies- "it was an Infection".  . Hypothyroidism   . Mixed hyperlipidemia    MILD PER PT  . Nocturia   . Seizures ([place])    x 2 2016- "none since taking Gabapentin"   Social History   Socioeconomic History  . Marital status: Married    Spouse name: Not on file  . Number of children: Not on file  . Years of education: Not on file  . Highest education level: Not on file  Occupational History  . Not on file  Social Needs  . Financial resource strain: Not on file  . Food insecurity:    Worry: Not on file    Inability: Not on file  . Transportation needs:    Medical: Not on file    Non-medical: Not on file  Tobacco Use  . Smoking status: Former Smoker    Years: 15.00    Types: Cigarettes    Last attempt to quit: 05/05/1981    Years since quitting: 36.7  . Smokeless tobacco: Never Used  . Tobacco comment: QUIT SMOKING 34 YRS  AGO--  APPROX. 1980'S  Substance and Sexual Activity  . Alcohol use: No    Comment: quit 34 yrs ago  . Drug use: No  . Sexual activity: Not on file  Lifestyle  . Physical activity:    Days per week: Not on file    Minutes per session: Not on file  . Stress: Not on file  Relationships  . Social connections:    Talks on phone: Not on file    Gets together: Not on file    Attends religious service: Not on file    Active member of club or organization: Not on file    Attends meetings of clubs or organizations: Not on file  Relationship status: Not on file  Other Topics Concern  . Not on file  Social History Narrative  . Not on file   Family History  Problem Relation Age of Onset  . Diabetes Other    Scheduled Meds: . pantoprazole (PROTONIX) IV  40 mg Intravenous Q12H  . sodium chloride flush  3 mL Intravenous Q12H   Continuous Infusions: PRN Meds:.haloperidol lactate, LORazepam, morphine injection, [DISCONTINUED] morphine CONCENTRATE **OR** morphine CONCENTRATE Medications Prior to Admission:  Prior to Admission medications   Medication Sig Start Date End Date Taking? Authorizing Provider  ferrous sulfate 325 (65 FE) MG tablet Take 325 mg by mouth daily. 07/17/16 04/21/18 Yes [provider]  Multiple Vitamin (MULTIVITAMIN WITH MINERALS) TABS tablet Take 1 tablet daily by mouth.   Yes [provider]  omeprazole (PRILOSEC OTC) 20 MG tablet Take 40 mg by mouth 2 (two) times daily.   Yes [provider]  PRESCRIPTION MEDICATION Take 100 mcg by mouth daily before breakfast. Levothyroxine 100 mcg tablets from [place]   Yes [provider]  promethazine (PHENERGAN) 12.5 MG tablet Take 12.5 mg by mouth every 6 (six) hours as needed for nausea or vomiting.    Yes [provider]  acetaminophen (TYLENOL) 325 MG tablet Take 1-2 tablets (325-650 mg total) by mouth every 4 (four) hours as needed for mild pain. Patient not taking: Reported on  12/30/2017 06/05/14   Angiulli, Lavon Paganini, PA-C  aspirin 325 MG tablet Take 1 tablet (325 mg total) daily by mouth. Patient not taking: Reported on 12/30/2017 03/02/17   Velvet Bathe, MD  atorvastatin (LIPITOR) 20 MG tablet Take 1 tablet (20 mg total) daily at 6 PM by mouth. Patient not taking: Reported on 01/05/2018 03/01/17   Velvet Bathe, MD  levothyroxine (SYNTHROID, LEVOTHROID) 175 MCG tablet Take 1 tablet (175 mcg total) by mouth daily before breakfast. Patient not taking: Reported on 01/07/2018 06/05/14   Angiulli, Lavon Paganini, PA-C  ondansetron (ZOFRAN) 4 MG tablet Take 1 tablet (4 mg total) by mouth every 8 (eight) hours as needed for nausea or vomiting. Patient not taking: Reported on 01/18/2018 09/29/16   Clayton Bibles, PA-C  oxyCODONE-acetaminophen (PERCOCET/ROXICET) 5-325 MG tablet Take 1-2 tablets by mouth every 4 (four) hours as needed for severe pain. Patient not taking: Reported on 12/29/2017 09/29/16   Clayton Bibles, PA-C   Allergies  Allergen Reactions  . Orange Fruit [Citrus] Rash   Review of Systems  Physical Exam  Constitutional:  cachetic  Neurological:  lethargic  Skin: There is pallor.  Nursing note and vitals reviewed.   Vital Signs: BP 122/81 (BP Location: Right Arm)   Pulse 73   Temp 98.2 F (36.8 C) (Oral)   Resp 16   Ht '5\' 6"'$  (1.676 m)   Wt 66 kg   SpO2 100%   BMI 23.48 kg/m  Pain Scale: 0-10   Pain Score: 0-No pain   SpO2: SpO2: 100 % O2 Device:SpO2: 100 % O2 Flow Rate: .O2 Flow Rate (L/min): 15 L/min  IO: Intake/output summary:   Intake/Output Summary (Last 24 hours) at 01/22/2018 1513 Last data filed at 01/22/2018 1107 Gross per 24 hour  Intake 467.48 ml  Output 2000 ml  Net -1532.52 ml    LBM: Last BM Date: 01/22/18 Baseline Weight: Weight: 66 kg Most recent weight: Weight: 66 kg     Palliative Assessment/Data: PPS: 10%     Thank you for this consult. Palliative medicine will continue to follow and assist as needed.  Time In: 1400 Time  Out: 1530 Time Total: 90 minutes Prolonged services billed: yes Greater than 50%  of this time was spent counseling and coordinating care related to the above assessment and plan.  Signed by: Mariana Kaufman, AGNP-C Palliative Medicine    Please contact Palliative Medicine Team phone at [phone] for questions and concerns.  For individual provider: See Shea Evans

## 2018-01-22 DEATH — deceased

## 2018-01-23 DIAGNOSIS — C240 Malignant neoplasm of extrahepatic bile duct: Secondary | ICD-10-CM

## 2018-01-23 DIAGNOSIS — E039 Hypothyroidism, unspecified: Secondary | ICD-10-CM

## 2018-01-23 DIAGNOSIS — K311 Adult hypertrophic pyloric stenosis: Secondary | ICD-10-CM

## 2018-01-23 MED ORDER — LEVOTHYROXINE SODIUM 100 MCG IV SOLR
75.0000 ug | Freq: Every day | INTRAVENOUS | Status: DC
  Administered 2018-01-23 – 2018-01-27 (×4): 75 ug via INTRAVENOUS
  Filled 2018-01-23 (×4): qty 5

## 2018-01-23 MED ORDER — DEXAMETHASONE SODIUM PHOSPHATE 10 MG/ML IJ SOLN
10.0000 mg | Freq: Once | INTRAMUSCULAR | Status: AC
  Administered 2018-01-23: 10 mg via INTRAVENOUS
  Filled 2018-01-23: qty 1

## 2018-01-23 MED ORDER — DEXAMETHASONE SODIUM PHOSPHATE 4 MG/ML IJ SOLN
4.0000 mg | INTRAMUSCULAR | Status: DC
  Administered 2018-01-24 – 2018-01-27 (×4): 4 mg via INTRAVENOUS
  Filled 2018-01-23 (×4): qty 1

## 2018-01-23 MED ORDER — SODIUM CHLORIDE 0.9 % IV SOLN
50.0000 ug/h | INTRAVENOUS | Status: DC
  Administered 2018-01-23 (×2): 50 ug/h via INTRAVENOUS
  Filled 2018-01-23 (×4): qty 1

## 2018-01-23 MED ORDER — FAMOTIDINE IN NACL 20-0.9 MG/50ML-% IV SOLN
20.0000 mg | Freq: Two times a day (BID) | INTRAVENOUS | Status: DC
  Administered 2018-01-23 – 2018-01-27 (×8): 20 mg via INTRAVENOUS
  Filled 2018-01-23 (×10): qty 50

## 2018-01-23 MED ORDER — MORPHINE SULFATE (PF) 2 MG/ML IV SOLN
2.0000 mg | INTRAVENOUS | Status: DC | PRN
  Administered 2018-01-26: 1 mg via INTRAVENOUS
  Filled 2018-01-23: qty 1

## 2018-01-23 NOTE — Care Management Important Message (Signed)
Important Message  Patient Details  Name: Geoffrey Neal MRN: [id number] Date of Birth: 07-14-1945   Medicare Important Message Given:  No  Patient is at end of life out of respect for the patient No IM given. Couper Juncaj 01/23/2018, 1:28 PM

## 2018-01-23 NOTE — Progress Notes (Signed)
PROGRESS NOTE    Geoffrey Neal  YHC:[id number] DOB: 04-Aug-1945 DOA: 01/11/2018 PCP: [facility], [facility]    Brief Narrative: This is a 72 year old man originally from [place] with pancreaticobiliary cancer treated with a Whipple in September 2018, followed by capecitabine + radiation, he declined adjuvant systemic therapy due to concerns about toxicity and went to his home country for alternative treatment.  He presents after just coming back to the [place] yesterday with a constellation of symptoms including fatigue, weakness, nausea, vomiting, coffee-ground emesis, declining functional status.   The goal for now is comfort directed care.  Palliative care input is highly appreciated.  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  Repeated CT head stable. Neurosurgery recommend further work up for encephalopathy if CT stable. Exam out of proportion for CT head finding. Patient didn't received sedatives. Ammonia level is elevated at 80. Start lactulose enema.  -EEG negative for seizure.  -continue with antibiotics to cover for SBP prophylaxis.  -CO 2 on B-met at 27, not elevated. For hypoxemia, repeated  chest x ray.  -Patient is DNR, DNI.  -IV synthroid.  -01/23/2018: Patient is awake, alert and oriented to time, place and person.  No encephalopathy.     2-GI bleed, coffee ground emesis;  On IV protonix  BID Has NG tube.  CT abdomen with gastric outlet obstruction.  GI consulted.  Received 2 units PRBC. 01/23/2018: Pursue comfort directed  care.  3-Acute blood loss anemia;  Related to GI bleed, related to malignancy.  Received 2 units PRBC>  Hb stable at 10/  01/23/2018: No further H/H check.   4-Pancreaticobiliary cancer;  treated with a Whipple in September 2018, followed by capecitabine + radiation, CT ; Metallic stent extending from the gastric antrum to the jejunum. Distended stomach, suggesting some degree of gastric outlet obstruction. Associated masslike thickening in the region of the gastric antrum/residual duodenum, reflecting tumor. Overlying peritoneal disease/omental caking beneath the anterior abdominal wall. Loculated malignant ascites with mass effect in the right upper abdomen. -Oncology consulted. Recommended comfort care, hospice  Transaminases, hyperbilirubinemia, related to malignancy  Awaiting palliative, care meeting.  Discussed with Dr Burr Medico today 10-01 , patient is not candidate for chemo, at time of discharge could remove NGT if not significant drainage and patient should be on clear diet. 01/23/2018: Comfort directed care.    5-Acute hypoxic respiratory failure;  On NB. Taper off oxygen as possible.  Repeated  chest x ray, clear.  Support.  DNR, DNI>     DVT prophylaxis: SCD. No anticoagulation given head bleed.  Code Status: DNR Family Communication: Son, daughter and wife.   Disposition Plan: Will depend on hospital course.  Possible hospice home.  Consultants:   Oncology  Neurosurgery.    Gastroenterology  Palliative care team.   Procedures:   EEG; negative for seizure.    Antimicrobials:  Ceftriaxone discontinued.    Subjective: Alert, asking for something to drink.  Denies headaches. He was able to tell me he is in the hospital, recognized his son.    Objective: Vitals:   01/22/18 0536 01/22/18 1437 01/23/18  0600 01/23/18 1513  BP: 114/73 122/81 (!) 119/94 119/83  Pulse: 74 73 73 85  Resp: '16 16 18 16  '$ Temp: 97.7 F (36.5 C) 98.2 F (36.8 C) 97.6 F (36.4 C)  98.1 F (36.7 C)  TempSrc: Oral Oral Axillary Oral  SpO2: 100% 100% 100% 100%  Weight:      Height:  '5\' 6"'$  (1.676 m)      Intake/Output Summary (Last 24 hours) at 01/23/2018 1908 Last data filed at 01/23/2018 1800 Gross per 24 hour  Intake 286.75 ml  Output 1300 ml  Net -1013.25 ml   Filed Weights   01/21/18 0344  Weight: 66 kg    Examination:  General exam: Alert.  Respiratory system: normal respiratory effort, no crackles.  Cardiovascular system: S 1, S 2 RRR Gastrointestinal system: BS present, soft, nt Central nervous system: alert, answering questions.  Extremities: trace edema     Data Reviewed: I have personally reviewed following labs and imaging studies  CBC: Recent Labs  Lab 12/29/2017 1848 01/21/18 1132 01/21/18 1916  WBC 7.8 7.4 7.6  NEUTROABS  --  7.2  --   HGB 7.7* 10.2* 10.2*  HCT 26.6* 30.5* 29.9*  MCV 124.3* 97.1 96.1  PLT 126* 117* 98*   Basic Metabolic Panel: Recent Labs  Lab 12/23/2017 1848 01/21/18 1132  NA 137 138  K 3.5 3.2*  CL 100 97*  CO2 27 27  GLUCOSE 110* 96  BUN 7* 10  CREATININE 0.73 0.74  CALCIUM 7.8* 7.3*   GFR: Estimated Creatinine Clearance: 73.1 mL/min (by C-G formula based on SCr of 0.74 mg/dL). Liver Function Tests: Recent Labs  Lab 01/02/2018 1848 01/21/18 1132  AST 204* 160*  ALT 110* 96*  ALKPHOS 1,953* 1,583*  BILITOT 4.2* 4.4*  PROT 6.3* 5.8*  ALBUMIN 2.4* 2.1*   No results for input(s): LIPASE, AMYLASE in the last 168 hours. Recent Labs  Lab 12/31/2017 2121 01/21/18 1132  AMMONIA 87* 80*   Coagulation Profile: Recent Labs  Lab 01/01/2018 2121  INR 1.12   Cardiac Enzymes: No results for input(s): CKTOTAL, CKMB, CKMBINDEX, TROPONINI in the last 168 hours. BNP (last 3 results) No results for input(s): PROBNP in the last 8760 hours. HbA1C: No results for input(s): HGBA1C in the last 72 hours. CBG: Recent Labs  Lab 01/22/18 0526 01/22/18 0547 01/22/18 0808 01/22/18 1223 01/22/18 1604   GLUCAP 69* 167* 89 85 100*   Lipid Profile: No results for input(s): CHOL, HDL, LDLCALC, TRIG, CHOLHDL, LDLDIRECT in the last 72 hours. Thyroid Function Tests: Recent Labs    01/21/2018 2121  TSH 22.806*   Anemia Panel: Recent Labs    01/21/18 1132  VITAMINB12 1,136*  FERRITIN 4,933*  TIBC 195*  IRON 68   Sepsis Labs: No results for input(s): PROCALCITON, LATICACIDVEN in the last 168 hours.  No results found for this or any previous visit (from the past 240 hour(s)).   Radiology Studies: No results found.  Scheduled Meds: . [START ON 01/24/2018] dexamethasone  4 mg Intravenous Q24H  . levothyroxine  75 mcg Intravenous Daily  . pantoprazole (PROTONIX) IV  40 mg Intravenous Q12H  . sodium chloride flush  3 mL Intravenous Q12H   Continuous Infusions: . famotidine (PEPCID) IV 20 mg (01/23/18 1200)  . octreotide  (SANDOSTATIN)    IV infusion 50 mcg/hr (01/23/18 1207)     LOS: 3 days    Time spent: 25 minutes.    Bonnell Public, MD [facility] Pager [phone]  If  7PM-7AM, please contact night-coverage [email or web address] Password [email or web address] 01/23/2018, 7:08 PM

## 2018-01-23 NOTE — Progress Notes (Addendum)
Daily Progress Note   Patient Name: Geoffrey Neal       Date: 01/23/2018 DOB: 17-Apr-1946  Age: 72 y.o. MRN#: [id number] Attending Physician: Bonnell Public, MD Primary Care Physician: Medicine, [facility] Admit Date: 01/19/2018  Reason for Consultation/Follow-up: Establishing goals of care, Non pain symptom management and Terminal Care  Subjective: Met with patient's family again today. Patient awake today, but confused. Continues to have copious output through NG tube- 951m in last 24 hours. Some nausea last night. No complaints of pain. He was able to take a few sips of his favorite drink yesterday. Answered family's questions related to comfort care. Although patient is more awake today, they continue to desire comfort as patient's GOC. Family requests lotion applied to patient's dry skin.   Review of Systems  Unable to perform ROS: Mental acuity    Length of Stay: 3  Current Medications: Scheduled Meds:  . dexamethasone  10 mg Intravenous Once  . [START ON 01/24/2018] dexamethasone  4 mg Intravenous Q24H  . pantoprazole (PROTONIX) IV  40 mg Intravenous Q12H  . sodium chloride flush  3 mL Intravenous Q12H    Continuous Infusions: . famotidine (PEPCID) IV    . octreotide  (SANDOSTATIN)    IV infusion      PRN Meds: haloperidol lactate, LORazepam, morphine injection, [DISCONTINUED] morphine CONCENTRATE **OR** morphine CONCENTRATE, ondansetron  Physical Exam  Constitutional:  cachetic  Cardiovascular:  tachycardic  Neurological:  lethargic  Skin: There is pallor.  Nursing note and vitals reviewed.           Vital Signs: BP (!) 119/94 (BP Location: Right Arm)   Pulse 73   Temp 97.6 F (36.4 C) (Axillary)   Resp 18   Ht '5\' 6"'$  (1.676 m)   Wt 66  kg   SpO2 100%   BMI 23.48 kg/m  SpO2: SpO2: 100 % O2 Device: O2 Device: NRB O2 Flow Rate: O2 Flow Rate (L/min): 15 L/min  Intake/output summary:   Intake/Output Summary (Last 24 hours) at 01/23/2018 1103 Last data filed at 01/23/2018 0900 Gross per 24 hour  Intake 1153.53 ml  Output 1350 ml  Net -196.47 ml   LBM: Last BM Date: 01/22/18 Baseline Weight: Weight: 66 kg Most recent weight: Weight: 66 kg       Palliative  Assessment/Data: PPS: 20%      Patient Active Problem List   Diagnosis Date Noted  . Palliative care by specialist   . Advanced care planning/counseling discussion   . Goals of care, counseling/discussion   . Malignant neoplasm of pancreas ([place]) 01/21/2018  . Acute hepatic encephalopathy 01/21/2018  . Gastrointestinal hemorrhage 12/24/2017  . 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 TIA (transient ischemic attack) 03/01/2017  . History of seizure   . S/P resection of meningioma   . 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 Paraparesis ([place]) 05/30/2014  . Meningioma ([place])   . Brain tumor ([place]) 05/07/2014  . CONSTIPATION 11/16/2009  . 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 HYPERGLYCEMIA 09/29/2008  . 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 GERD 02/18/2007  . VITILIGO 02/11/2007  . Hypothyroidism 02/04/2007  . DEGENERATIVE JOINT DISEASE 02/04/2007  . ARTHRALGIA 02/04/2007    Palliative Care Assessment & Plan   Patient Profile:  72 y.o. 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 Workup revealed gastric outlet obstruction with progressing pancreatic cancer, elevated transaminases for which he has received lactulose enema. Additionally, he fell in the ED after becoming syncopal and was found to have small SDH  and parietal bone fracture. He has been unresponsive- but this was not felt to be related to the SDH. After consulting with Oncology and GI- family has made decision to transition to comfort care measures. Palliative medicine consulted for [name] and symptom management.      Assessment/Recommendations/Plan   Symptom management: patient continues to have copious GI output from NG tube and nausea- will attempt to manage this to provide comfort:   Somatostatin 54mg/hr continuous infusion  Famotodine '20mg'$  bid  Dexamethasone '10mg'$  today then '4mg'$  daily   Family also requesting that patient's synthroid be restarted- will restart per their request    Due to the complexity of his symptom management I would not recommend that he be discharged to outpatient Hospice at this point  Goals of Care and Additional Recommendations:  Limitations on Scope of Treatment: Full Comfort Care  Code Status:  DNR  Prognosis:   < 2 weeks  Discharge Planning:  To Be Determined  Care plan was discussed with patient's family and Dr. GHilma Favors  Thank you for allowing the Palliative Medicine Team to assist in the care of this patient.   Time In: 1000 Time Out: 1115 Total Time 75 minutes Prolonged Time Billed Yes      Greater than 50%  of this time was spent counseling and coordinating care related to the above assessment and plan.  KMariana Kaufman AGNP-C Palliative Medicine   Please contact Palliative Medicine Team phone at [phone] questions and concerns.

## 2018-01-23 NOTE — Progress Notes (Signed)
Geoffrey Neal   DOB:1945/08/21   GY#:[id number]   [phone]  Oncology f/u   Subjective: Patient was awake, alert, and playing chess with his son in his bed, when I saw him today.  His acute encephalopathy has much improved, and he asked and answered questions appropriately. His wife and daughter-in-law were also at bedside.  He still has NG tube with significant output.   Objective:  Vitals:   01/23/18 0600 01/23/18 1513  BP: (!) 119/94 119/83  Pulse: 73 85  Resp: 18 16  Temp: 97.6 F (36.4 C) 98.1 F (36.7 C)  SpO2: 100% 100%    Body mass index is 23.48 kg/m.  Intake/Output Summary (Last 24 hours) at 01/23/2018 2015 Last data filed at 01/23/2018 1830 Gross per 24 hour  Intake 586.75 ml  Output 1500 ml  Net -913.25 ml     (+) mild jaundice   Oropharynx clear  No peripheral adenopathy  Lungs clear -- no rales or rhonchi  Heart regular rate and rhythm  Abdomen benign  MSK no focal spinal tenderness, no peripheral edema  Neuro nonfocal   CBG (last 3)  Recent Labs    01/22/18 0808 01/22/18 1223 01/22/18 1604  GLUCAP 89 85 100*     Labs:  Lab Results  Component Value Date   WBC 7.6 01/21/2018   HGB 10.2 (L) 01/21/2018   HCT 29.9 (L) 01/21/2018   MCV 96.1 01/21/2018   PLT 98 (L) 01/21/2018   NEUTROABS 7.2 01/21/2018   CMP Latest Ref Rng & Units 01/21/2018 12/29/2017 03/01/2017  Glucose 70 - 99 mg/dL 96 110(H) 115(H)  BUN 8 - 23 mg/dL 10 7(L) 8  Creatinine 0.61 - 1.24 mg/dL 0.74 0.73 0.80  Sodium 135 - 145 mmol/L 138 137 142  Potassium 3.5 - 5.1 mmol/L 3.2(L) 3.5 4.0  Chloride 98 - 111 mmol/L 97(L) 100 103  CO2 22 - 32 mmol/L 27 27 -  Calcium 8.9 - 10.3 mg/dL 7.3(L) 7.8(L) -  Total Protein 6.5 - 8.1 g/dL 5.8(L) 6.3(L) -  Total Bilirubin 0.3 - 1.2 mg/dL 4.4(H) 4.2(H) -  Alkaline Phos 38 - 126 U/L 1,583(H) 1,953(H) -  AST 15 - 41 U/L 160(H) 204(H) -  ALT 0 - 44 U/L 96(H) 110(H) -     Urine Studies No results for input(s): UHGB, CRYS in the last 72  hours.  Invalid input(s): UACOL, UAPR, USPG, UPH, UTP, UGL, UKET, UBIL, UNIT, UROB, ULEU, UEPI, UWBC, URBC, UBAC, CAST, UCOM, BILUA  Basic Metabolic Panel: Recent Labs  Lab 12/29/2017 1848 01/21/18 1132  NA 137 138  K 3.5 3.2*  CL 100 97*  CO2 27 27  GLUCOSE 110* 96  BUN 7* 10  CREATININE 0.73 0.74  CALCIUM 7.8* 7.3*   GFR Estimated Creatinine Clearance: 73.1 mL/min (by C-G formula based on SCr of 0.74 mg/dL). Liver Function Tests: Recent Labs  Lab 12/31/2017 1848 01/21/18 1132  AST 204* 160*  ALT 110* 96*  ALKPHOS 1,953* 1,583*  BILITOT 4.2* 4.4*  PROT 6.3* 5.8*  ALBUMIN 2.4* 2.1*   No results for input(s): LIPASE, AMYLASE in the last 168 hours. Recent Labs  Lab 01/19/2018 2121 01/21/18 1132  AMMONIA 87* 80*   Coagulation profile Recent Labs  Lab 01/05/2018 2121  INR 1.12    CBC: Recent Labs  Lab 01/18/2018 1848 01/21/18 1132 01/21/18 1916  WBC 7.8 7.4 7.6  NEUTROABS  --  7.2  --   HGB 7.7* 10.2* 10.2*  HCT 26.6* 30.5* 29.9*  MCV  124.3* 97.1 96.1  PLT 126* 117* 98*   Cardiac Enzymes: No results for input(s): CKTOTAL, CKMB, CKMBINDEX, TROPONINI in the last 168 hours. BNP: Invalid input(s): POCBNP CBG: Recent Labs  Lab 01/22/18 0526 01/22/18 0547 01/22/18 0808 01/22/18 1223 01/22/18 1604  GLUCAP 69* 167* 89 85 100*   D-Dimer No results for input(s): DDIMER in the last 72 hours. Hgb A1c No results for input(s): HGBA1C in the last 72 hours. Lipid Profile No results for input(s): CHOL, HDL, LDLCALC, TRIG, CHOLHDL, LDLDIRECT in the last 72 hours. Thyroid function studies Recent Labs    01/17/2018 2121  TSH 22.806*   Anemia work up Recent Labs    01/21/18 1132  VITAMINB12 1,136*  FERRITIN 4,933*  TIBC 195*  IRON 68   Microbiology No results found for this or any previous visit (from the past 240 hour(s)).    Studies:  No results found.  Assessment: 72 y.o.  male from [place], history of pancreatic cancer, status post Whipple surgery in  September 2018, and adjuvant chemoradiation, but patient declined adjuvant chemotherapy.  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  He has had worsening intermittent nausea, vomiting, weight loss.  1.  Acute encephalopathy, secondary to multiorgan failure, and subdural and intracranial hemorrhage, much improved now  2.  Metastatic pancreatic cancer 3.  Hyperbilirubinemia and liver failure, secondary to malignant obstruction  4.  Upper GI bleeding, secondary to local recurrence 5.  Acute blood loss anemia, status post 2 units blood transfusion 6.  Acute hypoxic respiratory failure, unsure about etiology, much improved  7.  Intermittent nausea, vomiting, weight loss, and deconditioning, secondary to underlying malignancy and gastric outlet obstruction.   Plan:  -Pt's mental status has much improved, he is awake, alert, asked and answered questions appropriately.  I reviewed his CT scan and lab findings, we discussed his cancer has metastasized and is not curable at this stage.  Due to his significant liver failure, and overall poor health, he is not a candidate for chemotherapy.  And pt previously declined chemotherapy multiple times. -I think hospice is the best option for him now. If he is stable like this for a few days, I think he maybe able to be discharged with home hospice or residential hospice.  -His gastric obstruction and nutrition will be a problem if he is going to live more than a few weeks. We discussed the option of venting G-tube for obstruction and J-tube placement for tube feeds if he desires. Certainly these procedures will not be necessary if he deteriorates quickly again. I explained the above to pt and his family, and they will think about it.  -Appreciate palliative care input. I will touch base with then tomorrow.  -will f/u as needed.   Truitt Merle  01/23/2018    Truitt Merle, MD 01/23/2018  8:15 PM

## 2018-01-24 ENCOUNTER — Inpatient Hospital Stay: Payer: Self-pay

## 2018-01-24 ENCOUNTER — Encounter (HOSPITAL_COMMUNITY): Payer: Self-pay | Admitting: General Practice

## 2018-01-24 MED ORDER — LORAZEPAM 0.5 MG PO TABS: 0.5000 mg | ORAL_TABLET | ORAL | Status: DC | PRN

## 2018-01-24 MED ORDER — LORAZEPAM 2 MG/ML IJ SOLN: 0.5000 mg | Freq: Four times a day (QID) | INTRAMUSCULAR | Status: DC | PRN

## 2018-01-24 MED ORDER — OCTREOTIDE ACETATE 500 MCG/ML IJ SOLN
300.0000 ug | Freq: Three times a day (TID) | INTRAMUSCULAR | Status: DC
  Administered 2018-01-24: 300 ug via SUBCUTANEOUS
  Filled 2018-01-24: qty 0.6

## 2018-01-24 MED ORDER — SODIUM CHLORIDE 0.9 % IV SOLN
50.0000 ug/h | INTRAVENOUS | Status: DC
  Administered 2018-01-24 – 2018-01-27 (×8): 50 ug/h via INTRAVENOUS
  Filled 2018-01-24 (×14): qty 1

## 2018-01-24 MED ORDER — SODIUM CHLORIDE 0.9 % IV SOLN
INTRAVENOUS | Status: AC
  Administered 2018-01-24: 21:00:00 via INTRAVENOUS

## 2018-01-24 MED ORDER — DEXAMETHASONE SODIUM PHOSPHATE 4 MG/ML IJ SOLN
4.0000 mg | Freq: Once | INTRAMUSCULAR | Status: DC
  Administered 2018-01-24: 4 mg via INTRAVENOUS
  Filled 2018-01-24: qty 1

## 2018-01-24 MED ORDER — SODIUM CHLORIDE 0.9% FLUSH: 10.0000 mL | INTRAVENOUS | Status: DC | PRN

## 2018-01-24 MED ORDER — MORPHINE SULFATE (CONCENTRATE) 10 MG/0.5ML PO SOLN
10.0000 mg | ORAL | Status: DC | PRN
  Administered 2018-01-27: 10 mg via SUBLINGUAL
  Filled 2018-01-24: qty 0.5

## 2018-01-24 MED ORDER — GLYCOPYRROLATE 0.2 MG/ML IJ SOLN
0.2000 mg | INTRAMUSCULAR | Status: DC | PRN
  Administered 2018-01-25 – 2018-01-27 (×6): 0.2 mg via INTRAVENOUS
  Filled 2018-01-24 (×6): qty 1

## 2018-01-24 NOTE — Progress Notes (Signed)
Patient's IV got infiltrated, several attempts made to reinsert IV by different IV team. MD made aware.

## 2018-01-24 NOTE — Progress Notes (Addendum)
Daily Progress Note   Patient Name: Geoffrey Neal       Date: 01/24/2018 DOB: [birth year]  Age: 72 y.o. MRN#: [id number] Attending Physician: Bonnell Public, MD Primary Care Physician: Medicine, [facility] Admit Date: 01/02/2018  Reason for Consultation/Follow-up: Establishing goals of care and Non pain symptom management  Subjective: Patient in bed, does not wake to my voice, moves a little when family members talk to him. Appears comfortable. Abdomen appears slightly distended. Per family he had good day yesterday day, was awake, playing chess, singing with his wife, drinking his favorite drink, requesting ice cream and soup. Per nursing he pulled out his NG tube after becoming agitated last night. He received IV lorazepam with agitation. Today IV access became infiltrated, family is considering PICC line placement.   ROS  Length of Stay: 4  Current Medications: Scheduled Meds:  . dexamethasone  4 mg Intravenous Q24H  . levothyroxine  75 mcg Intravenous Daily  . octreotide  300 mcg Subcutaneous Q8H  . pantoprazole (PROTONIX) IV  40 mg Intravenous Q12H  . sodium chloride flush  3 mL Intravenous Q12H    Continuous Infusions: . famotidine (PEPCID) IV 20 mg (01/23/18 2130)    PRN Meds: haloperidol lactate, LORazepam, morphine injection, [DISCONTINUED] morphine CONCENTRATE **OR** morphine CONCENTRATE, ondansetron  Physical Exam  Constitutional:  cachetic  Cardiovascular: Normal rate.  Peripheral pulses weak  Pulmonary/Chest: Effort normal.  Abdominal: He exhibits distension and mass.  Neurological:  Does not respond to voice  Skin:  Feet cool  Nursing note and vitals reviewed.           Vital Signs: BP 115/81 (BP Location: Left Arm)   Pulse 82    Temp (!) 97.4 F (36.3 C) (Axillary)   Resp 16   Ht 5\' 6"  (1.676 m)   Wt 66 kg   SpO2 98%   BMI 23.48 kg/m  SpO2: SpO2: 98 % O2 Device: O2 Device: Room Air O2 Flow Rate: O2 Flow Rate (L/min): 15 L/min  Intake/output summary:   Intake/Output Summary (Last 24 hours) at 01/24/2018 1425 Last data filed at 01/24/2018 1150 Gross per 24 hour  Intake 266.75 ml  Output 1050 ml  Net -783.25 ml   LBM: Last BM Date: 01/24/18 Baseline Weight: Weight: 66 kg Most recent weight: Weight: 66 kg  Palliative Assessment/Data:      Patient Active Problem List   Diagnosis Date Noted  . Gastric outlet obstruction   . Palliative care by specialist   . Advanced care planning/counseling discussion   . Goals of care, counseling/discussion   . Malignant neoplasm of pancreas ([place]) 01/21/2018  . Acute hepatic encephalopathy 01/21/2018  . Gastrointestinal hemorrhage 01/19/2018  . 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 TIA (transient ischemic attack) 03/01/2017  . History of seizure   . S/P resection of meningioma   . 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 Paraparesis ([place]) 05/30/2014  . Meningioma ([place])   . Brain tumor ([place]) 05/07/2014  . CONSTIPATION 11/16/2009  . 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 HYPERGLYCEMIA 09/29/2008  . 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 GERD 02/18/2007  . VITILIGO 02/11/2007  . Hypothyroidism 02/04/2007  . DEGENERATIVE JOINT DISEASE 02/04/2007  . ARTHRALGIA 02/04/2007    Palliative Care Assessment & Plan   Patient Profile: 72 y.o. male  with past medical history of pancreaticobiliary cancer s/p whipple 12/2016, chemo and radiation, declined adjuvant chemo, recently returned from [place] where he received alternative treatment with mistletoe infusion, GERD, DM, hypothyroid, seizures admitted on 01/02/2018 with nausea, vomiting and hematemesis. Workup revealed gastric outlet obstruction with progressing pancreatic cancer, elevated transaminases for which he has received  lactulose enema. Additionally, he fell in the ED after becoming syncopal and was found to have small SDH and parietal bone fracture. He has been unresponsive- but this was not felt to be related to the SDH. After consulting with Oncology and GI- family has made decision to transition to comfort care measures. Palliative medicine consulted for [place] and symptom management.      Assessment/Recommendations/Plan   Discussed with family that good moment yesterday could have been rally/golden moment before final decline and patient may be transitioning into actively dying  Patient may also be experiencing severe after effects of lorazepam due to his known liver injury  Regardless, patient is at end of life and the [place] is comfort  Main concern is how best to ensure he is adequately provided comfort measures- I am concerned that without IV access we will not be able to manage symptoms of gastric outlet obstruction- somatastatin can be given subq (but this is very painful and requires multiple sticks which increases discomfort and is not comfort oriented), lorazepam and morphine can be given sublingually (but are not as quick acting as IV), however, as his gastric secretions increase and the aggressive symptom management provided over the last 24 hours clears his system he is going to again become very uncomfortable and I anticipate he will need much more aggressive symptom managment.  I am unable to clearly prognosticate at this time if his prognosis is hours or days- if his change in mental status is due to transition to actively dying then I would prognosticate hours to a day and would not recommend transfer to residential hospice  If his mental status change is from the lorazepam I would prognosticate up to a week or two, and would recommend PICC line placement so that aggressive symptom management can be maintained, would also consider IR consult for venting PEG  Due to unclear factors- my best  recommendation is for patient to remain inpatient overnight until he declares himself- this was discussed with family and they were in agreement with this plan- and they wished to proceed now with PICC line placement for symptom management and I agree  Addendum- spoke later with Dr. Marthenia Rolling- he  notes he spoke with family and they are requesting residential Hospice- he is going to contact social worker  Second addendum- after further discussion with family and Dr. Hilma Favors- I will order PICC line and resume IV medications and this is best way to manage patients symptoms even if he discharges to residential Hospice  Goals of Care and Additional Recommendations:  Limitations on Scope of Treatment: Full Comfort Care  Code Status:  DNR  Prognosis:   < 2 weeks  Discharge Planning:  To Be Determined  Care plan was discussed with Dr. Rowe Pavy and Dr. Hilma Favors.  Thank you for allowing the Palliative Medicine Team to assist in the care of this patient.   Time In: 1145 1500 Time Out: 1230 1530 Total Time 45 mins 30 mins Prolonged Time Billed yes      Greater than 50%  of this time was spent counseling and coordinating care related to the above assessment and plan.  Mariana Kaufman, AGNP-C Palliative Medicine   Please contact Palliative Medicine Team phone at [phone] for questions and concerns.

## 2018-01-24 NOTE — Progress Notes (Signed)
Patient pulled out NG tube. Patient agitated and restless. PRN Ativan given as needed. Family agreed to leave out for now. Will follow up in am.

## 2018-01-24 NOTE — Progress Notes (Signed)
PROGRESS NOTE    Geoffrey Neal  BJS:[id number] DOB: [birth year] DOA: 01/16/2018 PCP: Medicine, [facility]    Brief Narrative: This is a 72 year old man originally from [place] with pancreaticobiliary cancer treated with a Whipple in September 2018, followed by capecitabine + radiation, he declined adjuvant systemic therapy due to concerns about toxicity and went to his home country for alternative treatment.  He presents after just coming back to the [place] yesterday with a constellation of symptoms including fatigue, weakness, nausea, vomiting, coffee-ground emesis, declining functional status.   The goal for now is comfort directed care.  Palliative care input is highly appreciated.  01/24/2018: Currently pursue comfort directed care.  Pursue discharge to hospice house whenever a bed is available.  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  Repeated CT head stable. Neurosurgery recommend further work up for encephalopathy if CT stable. Exam out of proportion for CT head finding. Patient didn't received sedatives. Ammonia level is elevated at 80. Start lactulose enema.  -EEG negative for seizure.  -continue with antibiotics to cover for SBP prophylaxis.  -CO 2 on B-met at 27, not elevated. For hypoxemia, repeated  chest x ray.  -Patient is DNR, DNI.  -IV synthroid.  -01/24/2018: Pursue disposition.  Patient is quite encephalopathic today.  2-GI bleed, coffee ground emesis;  On IV protonix  BID Has NG tube.  CT abdomen with gastric outlet  obstruction.  GI consulted.  Received 2 units PRBC. 01/23/2018: Pursue comfort directed care.  3-Acute blood loss anemia;  Related to GI bleed, related to malignancy.  Received 2 units PRBC>  Hb stable at 10/  01/23/2018: No further H/H check.   4-Pancreaticobiliary cancer;  treated with a Whipple in September 2018, followed by capecitabine + radiation, CT ; Metallic stent extending from the gastric antrum to the jejunum. Distended stomach, suggesting some degree of gastric outlet obstruction. Associated masslike thickening in the region of the gastric antrum/residual duodenum, reflecting tumor. Overlying peritoneal disease/omental caking beneath the anterior abdominal wall. Loculated malignant ascites with mass effect in the right upper abdomen. -Oncology consulted. Recommended comfort care, hospice  Transaminases, hyperbilirubinemia, related to malignancy  Awaiting palliative, care meeting.  Discussed with Dr Burr Medico today 10-01 , patient is not candidate for chemo, at time of discharge could remove NGT if not significant drainage and patient should be on clear diet. 01/23/2018: Comfort directed care.    5-Acute hypoxic respiratory failure;  On NB. Taper off oxygen as possible.  Repeated  chest x ray, clear.  Support.  DNR, DNI>     DVT prophylaxis: SCD. No anticoagulation given head bleed.  Code Status: DNR Family Communication: Son, daughter and wife.   Disposition Plan: Will depend on hospital course.  Possible hospice home.  Consultants:   Oncology  Neurosurgery.    Gastroenterology  Palliative care team.   Procedures:   EEG; negative for seizure.    Antimicrobials:  Ceftriaxone discontinued.    Subjective: Patient is encephalopathic.   No history feasible from the patient today.    Objective: Vitals:   01/22/18 1437 01/23/18 0600 01/23/18 1513 01/24/18  0544  BP: 122/81 (!) 119/94 119/83 115/81  Pulse: 73 73 85 82  Resp: '16 18 16 16  '$ Temp: 98.2 F (36.8  C) 97.6 F (36.4 C) 98.1 F (36.7 C) (!) 97.4 F (36.3 C)  TempSrc: Oral Axillary Oral Axillary  SpO2: 100% 100% 100% 98%  Weight:      Height: '5\' 6"'$  (1.676 m)       Intake/Output Summary (Last 24 hours) at 01/24/2018 1935 Last data filed at 01/24/2018 1800 Gross per 24 hour  Intake 0 ml  Output 850 ml  Net -850 ml   Filed Weights   01/21/18 0344  Weight: 66 kg    Examination:  General exam: Encephalopathic.    Respiratory system: normal respiratory effort, no crackles.  Cardiovascular system: S 1, S 2 RRR Gastrointestinal system: BS present, soft, nt Central nervous system: Encephalopathic. Extremities: trace edema     Data Reviewed: I have personally reviewed following labs and imaging studies  CBC: Recent Labs  Lab 01/14/2018 1848 01/21/18 1132 01/21/18 1916  WBC 7.8 7.4 7.6  NEUTROABS  --  7.2  --   HGB 7.7* 10.2* 10.2*  HCT 26.6* 30.5* 29.9*  MCV 124.3* 97.1 96.1  PLT 126* 117* 98*   Basic Metabolic Panel: Recent Labs  Lab 01/03/2018 1848 01/21/18 1132  NA 137 138  K 3.5 3.2*  CL 100 97*  CO2 27 27  GLUCOSE 110* 96  BUN 7* 10  CREATININE 0.73 0.74  CALCIUM 7.8* 7.3*   GFR: Estimated Creatinine Clearance: 73.1 mL/min (by C-G formula based on SCr of 0.74 mg/dL). Liver Function Tests: Recent Labs  Lab 12/23/2017 1848 01/21/18 1132  AST 204* 160*  ALT 110* 96*  ALKPHOS 1,953* 1,583*  BILITOT 4.2* 4.4*  PROT 6.3* 5.8*  ALBUMIN 2.4* 2.1*   No results for input(s): LIPASE, AMYLASE in the last 168 hours. Recent Labs  Lab 01/07/2018 2121 01/21/18 1132  AMMONIA 87* 80*   Coagulation Profile: Recent Labs  Lab 12/31/2017 2121  INR 1.12   Cardiac Enzymes: No results for input(s): CKTOTAL, CKMB, CKMBINDEX, TROPONINI in the last 168 hours. BNP (last 3 results) No results for input(s): PROBNP in the last 8760 hours. HbA1C: No results for input(s): HGBA1C in the last 72 hours. CBG: Recent Labs  Lab 01/22/18 0526 01/22/18 0547 01/22/18 0808  01/22/18 1223 01/22/18 1604  GLUCAP 69* 167* 89 85 100*   Lipid Profile: No results for input(s): CHOL, HDL, LDLCALC, TRIG, CHOLHDL, LDLDIRECT in the last 72 hours. Thyroid Function Tests: No results for input(s): TSH, T4TOTAL, FREET4, T3FREE, THYROIDAB in the last 72 hours. Anemia Panel: No results for input(s): VITAMINB12, FOLATE, FERRITIN, TIBC, IRON, RETICCTPCT in the last 72 hours. Sepsis Labs: No results for input(s): PROCALCITON, LATICACIDVEN in the last 168 hours.  No results found for this or any previous visit (from the past 240 hour(s)).   Radiology Studies: [place] Ekg Site Rite  Result Date: 01/24/2018 If [facility] image not attached, placement could not be confirmed due to current cardiac rhythm.   Scheduled Meds: . dexamethasone  4 mg Intravenous Q24H  . levothyroxine  75 mcg Intravenous Daily  . pantoprazole (PROTONIX) IV  40 mg Intravenous Q12H  . sodium chloride flush  3 mL Intravenous Q12H   Continuous Infusions: . famotidine (PEPCID) IV 20 mg (01/23/18 2130)  . octreotide  (SANDOSTATIN)    IV infusion 50 mcg/hr (01/24/18 1850)     LOS: 4 days    Time spent: 25 minutes.  Bonnell Public, MD [facility] Pager [phone]  If 7PM-7AM, please contact night-coverage [email or web address] Password [facility] 01/24/2018, 7:35 PM

## 2018-01-24 NOTE — Social Work (Signed)
CSW acknowledging consult for residential hospice placement.  Will make referral when comfort has been maximized and managed with PMT support.   [place], [place] Work [phone]

## 2018-01-24 NOTE — Progress Notes (Signed)
Peripherally Inserted Central Catheter/Midline Placement  The IV Nurse has discussed with the patient and/or persons authorized to consent for the patient, the purpose of this procedure and the potential benefits and risks involved with this procedure.  The benefits include less needle sticks, lab draws from the catheter, and the patient may be discharged home with the catheter. Risks include, but not limited to, infection, bleeding, blood clot (thrombus formation), and puncture of an artery; nerve damage and irregular heartbeat and possibility to perform a PICC exchange if needed/ordered by physician.  Alternatives to this procedure were also discussed.  [facility] Power PICC patient education guide, fact sheet on infection prevention and patient information card has been provided to patient /or left at bedside.    PICC/Midline Placement Documentation      Consent obtained via telephone with daughter Darlyn Read 01/24/2018, 6:35 PM

## 2018-01-25 DIAGNOSIS — Z515 Encounter for palliative care: Secondary | ICD-10-CM

## 2018-01-25 DIAGNOSIS — I609 Nontraumatic subarachnoid hemorrhage, unspecified: Secondary | ICD-10-CM

## 2018-01-25 DIAGNOSIS — S020XXA Fracture of vault of skull, initial encounter for closed fracture: Secondary | ICD-10-CM

## 2018-01-25 NOTE — Progress Notes (Signed)
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  He presents after just coming back to the [place] yesterday with a constellation of symptoms including fatigue, weakness, nausea, vomiting, coffee-ground emesis, declining functional status.   The goal for now is comfort directed care.  Palliative care input is highly appreciated.  01/25/2018: Comfort directed care.     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  Repeated CT head stable. Neurosurgery recommend further work up for encephalopathy if CT stable. Exam out of proportion for CT head finding. Patient didn't received sedatives. Ammonia level is elevated at 80. Start lactulose enema.  -EEG negative for seizure.  -continue with antibiotics to cover for SBP prophylaxis.  -CO 2 on B-met at 27, not elevated. For hypoxemia, repeated  chest x ray.  -Patient is DNR, DNI.  -IV synthroid.  -01/25/2018: Comfort directed care.    2-GI bleed, coffee ground emesis;  On IV protonix  BID Has NG tube.  CT abdomen with gastric outlet obstruction.    GI consulted.  Received 2 units PRBC. 01/25/2018: Comfort directed care.  3-Acute blood loss anemia;  Related to GI bleed, related to malignancy.  Received 2 units PRBC>  Hb stable at 10/  01/25/2018: No further H/H check.   4-Pancreaticobiliary cancer;  treated with a Whipple in September 2018, followed by capecitabine + radiation, CT ; Metallic stent extending from the gastric antrum to the jejunum. Distended stomach, suggesting some degree of gastric outlet obstruction. Associated masslike thickening in the region of the gastric antrum/residual duodenum, reflecting tumor. Overlying peritoneal disease/omental caking beneath the anterior abdominal wall. Loculated malignant ascites with mass effect in the right upper abdomen. -Oncology consulted. Recommended comfort care, hospice  Transaminases, hyperbilirubinemia, related to malignancy  Awaiting palliative, care meeting.  Discussed with Dr Burr Medico today 10-01 , patient is not candidate for chemo, at time of discharge could remove NGT if not significant drainage and patient should be on clear diet. 01/25/2018: Comfort directed care.    5-Acute hypoxic respiratory failure;  On NB. Taper off oxygen as possible.  Repeated  chest x ray, clear.  Support.  DNR, DNI>     DVT prophylaxis: SCD. No anticoagulation given head bleed.  Code Status: DNR Family Communication: Son, daughter and wife.   Disposition Plan: Will depend on hospital course.    Consultants:   Oncology  Neurosurgery.    Gastroenterology  Palliative care team.   Procedures:   EEG; negative for seizure.    Antimicrobials:  Ceftriaxone discontinued.    Subjective: Patient is encephalopathic.   No history feasible from the patient today.    Objective: Vitals:   01/23/18 0600  01/23/18 1513 01/24/18 0544 01/25/18 0302  BP: (!) 119/94 119/83 115/81 (!) 105/94  Pulse: 73 85 82 96  Resp: _0 Temp: 97.6 F (36.4 C) 98.1 F (36.7 C) (!) 97.4 F  (36.3 C) 98.1 F (36.7 C)  TempSrc: Axillary Oral Axillary Axillary  SpO2: 100% 100% 98% 98%  Weight:      Height:        Intake/Output Summary (Last 24 hours) at 01/25/2018 1439 Last data filed at 01/25/2018 1057 Gross per 24 hour  Intake 825.04 ml  Output 1850 ml  Net -1024.96 ml   Filed Weights   01/21/18 0344  Weight: 66 kg    Examination:  General exam: Encephalopathic.    Respiratory system: normal respiratory effort, no crackles.  Cardiovascular system: S 1, S 2 RRR Gastrointestinal system: BS present, soft, nt Central nervous system: Encephalopathic. Extremities: trace edema     Data Reviewed: I have personally reviewed following labs and imaging studies  CBC: Recent Labs  Lab 12/24/2017 1848 01/21/18 1132 01/21/18 1916  WBC 7.8 7.4 7.6  NEUTROABS  --  7.2  --   HGB 7.7* 10.2* 10.2*  HCT 26.6* 30.5* 29.9*  MCV 124.3* 97.1 96.1  PLT 126* 117* 98*   Basic Metabolic Panel: Recent Labs  Lab 01/16/2018 1848 01/21/18 1132  NA 137 138  K 3.5 3.2*  CL 100 97*  CO2 27 27  GLUCOSE 110* 96  BUN 7* 10  CREATININE 0.73 0.74  CALCIUM 7.8* 7.3*   GFR: Estimated Creatinine Clearance: 73.1 mL/min (by C-G formula based on SCr of 0.74 mg/dL). Liver Function Tests: Recent Labs  Lab 12/26/2017 1848 01/21/18 1132  AST 204* 160*  ALT 110* 96*  ALKPHOS 1,953* 1,583*  BILITOT 4.2* 4.4*  PROT 6.3* 5.8*  ALBUMIN 2.4* 2.1*   No results for input(s): LIPASE, AMYLASE in the last 168 hours. Recent Labs  Lab 12/27/2017 2121 01/21/18 1132  AMMONIA 87* 80*   Coagulation Profile: Recent Labs  Lab 12/26/2017 2121  INR 1.12   Cardiac Enzymes: No results for input(s): CKTOTAL, CKMB, CKMBINDEX, TROPONINI in the last 168 hours. BNP (last 3 results) No results for input(s): PROBNP in the last 8760 hours. HbA1C: No results for input(s): HGBA1C in the last 72 hours. CBG: Recent Labs  Lab 01/22/18 0526 01/22/18 0547 01/22/18 0808 01/22/18 1223 01/22/18 1604    GLUCAP 69* 167* 89 85 100*   Lipid Profile: No results for input(s): CHOL, HDL, LDLCALC, TRIG, CHOLHDL, LDLDIRECT in the last 72 hours. Thyroid Function Tests: No results for input(s): TSH, T4TOTAL, FREET4, T3FREE, THYROIDAB in the last 72 hours. Anemia Panel: No results for input(s): VITAMINB12, FOLATE, FERRITIN, TIBC, IRON, RETICCTPCT in the last 72 hours. Sepsis Labs: No results for input(s): PROCALCITON, LATICACIDVEN in the last 168 hours.  No results found for this or any previous visit (from the past 240 hour(s)).   Radiology Studies: [place] Ekg Site Rite  Result Date: 01/24/2018 If [facility] image not attached, placement could not be confirmed due to current cardiac rhythm.   Scheduled Meds: . dexamethasone  4 mg Intravenous Q24H  . levothyroxine  75 mcg Intravenous Daily  . pantoprazole (PROTONIX) IV  40 mg Intravenous Q12H  . sodium chloride flush  3 mL Intravenous Q12H   Continuous Infusions: . famotidine (PEPCID) IV 20 mg (01/25/18 1056)  . octreotide  (SANDOSTATIN)    IV infusion 50 mcg/hr (01/25/18 0449)     LOS: 5 days  Time spent: 25 minutes.    Bonnell Public, MD [facility] Pager [phone]  If 7PM-7AM, please contact night-coverage [email or web address] Password [email or web address] 01/25/2018, 2:39 PM

## 2018-01-25 NOTE — Progress Notes (Signed)
Daily Progress Note   Patient Name: Geoffrey Neal       Date: 01/25/2018 DOB: [birth year]  Age: 72 y.o. MRN#: [id number] Attending Physician: Bonnell Public, MD Primary Care Physician: Medicine, [facility] Admit Date: 01/15/2018  Reason for Consultation/Follow-up: Establishing goals of care and Non pain symptom management  Subjective: Patient has declined this morning. Periods of apnea noted. Not responding to family. Feet are cool, some mottling. Peripheral pulses are weak. Stomach with some decrease in distension. Patient does appear more comfortable than yesterday. Family asked about adding potassium and calcium to patient's IV fluids- Discussed with them the role of electrolytes in end of life care- would be considered life prolonging- as focus is comfort- and previously stated goal is to look at patient holistically, treat what we see as discomfort, checking labs and treating electrolytes have not been shown to add to comfort at this stage in patient's life and may prolong suffering.  Discussed disposition with family. They prefer to keep patient here for symptom management and final days. They do not feel that transferring patient for only a few days of care at [facility] would meet their [place].  Family reports they have family coming from [place], the last of which will be arriving on Sunday. They believe patient is hanging on for his arrival.  Family again had questions about patient's mental status improvement and decline. Discussed EOL phenomenon of rally/golden moments with rapid decline that follows.  ROS  Length of Stay: 5  Current Medications: Scheduled Meds:  . dexamethasone  4 mg Intravenous Q24H  . levothyroxine  75 mcg Intravenous Daily  . pantoprazole  (PROTONIX) IV  40 mg Intravenous Q12H  . sodium chloride flush  3 mL Intravenous Q12H    Continuous Infusions: . famotidine (PEPCID) IV 20 mg (01/24/18 2311)  . octreotide  (SANDOSTATIN)    IV infusion 50 mcg/hr (01/25/18 0449)    PRN Meds: glycopyrrolate, haloperidol lactate, LORazepam, morphine injection, morphine CONCENTRATE, ondansetron, sodium chloride flush  Physical Exam  Constitutional:  cachetic  Cardiovascular:  Weak peripheral pulses  Pulmonary/Chest:  Periods of apnea  Abdominal: He exhibits distension and mass.  Musculoskeletal: He exhibits edema.  Neurological:  Not responding to voice  Skin: There is pallor.  Nursing note and vitals reviewed.  Vital Signs: BP (!) 105/94 (BP Location: Left Arm)   Pulse 96   Temp 98.1 F (36.7 C) (Axillary)   Resp 18   Ht 5\' 6"  (1.676 m)   Wt 66 kg   SpO2 98%   BMI 23.48 kg/m  SpO2: SpO2: 98 % O2 Device: O2 Device: Nasal Cannula O2 Flow Rate: O2 Flow Rate (L/min): 2 L/min  Intake/output summary:   Intake/Output Summary (Last 24 hours) at 01/25/2018 1036 Last data filed at 01/25/2018 0901 Gross per 24 hour  Intake 867.04 ml  Output 2700 ml  Net -1832.96 ml   LBM: Last BM Date: 01/24/18 Baseline Weight: Weight: 66 kg Most recent weight: Weight: 66 kg       Palliative Assessment/Data: PPS: 10%      Patient Active Problem List   Diagnosis Date Noted  . Gastric outlet obstruction   . Palliative care by specialist   . Advanced care planning/counseling discussion   . Goals of care, counseling/discussion   . Malignant neoplasm of pancreas ([place]) 01/21/2018  . Acute hepatic encephalopathy 01/21/2018  . Gastrointestinal hemorrhage 12/24/2017  . Cancer of common bile duct ([place]) 03/01/2017  . TIA (transient ischemic attack) 03/01/2017  . History of seizure   . S/P resection of meningioma   . 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 Paraparesis ([place]) 05/30/2014  . Meningioma ([place])   . Brain tumor ([place])  05/07/2014  . CONSTIPATION 11/16/2009  . 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 HYPERGLYCEMIA 09/29/2008  . 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 GERD 02/18/2007  . VITILIGO 02/11/2007  . Hypothyroidism 02/04/2007  . DEGENERATIVE JOINT DISEASE 02/04/2007  . ARTHRALGIA 02/04/2007    Palliative Care Assessment & Plan   Patient Profile: 72 y.o.malewith past medical history of pancreaticobiliary cancer s/p whipple 12/2016, chemo and radiation, declined adjuvant chemo, recently returned from [place] where he received alternative treatment with mistletoe infusion, GERD, DM, hypothyroid, seizuresadmitted on 9/29/2019with nausea, vomiting and hematemesis.Workup revealed gastric outlet obstruction with progressing pancreatic cancer, elevated transaminases for which he has received lactulose enema. Additionally, he fell in the ED after becoming syncopal and was found to have small SDH and parietal bone fracture. He has been unresponsive- but this was not felt to be related to the SDH. After consulting with Oncology and GI- family has made decision to transition to comfort care measures. Palliative medicine consulted for [place] and symptom management.  Assessment/Recommendations/Plan    Patient is transitioning to actively dying  I believe his time is limited to days to less than one week  I discussed disposition with family- they are concerned about him dying during transport which is a valid concern, they also expressed concerns about him transferring only to receive care for a few days  I do not believe patient is stable for transport  D/C nasal cannula  Please offer IV morphine if patient appears to be grimacing or appears uncomfortable- discussed signs of discomfort with family  Will continue current comfort medications now as patient appears much more comfortable today since they have been restarted- will discuss d/c IV synthroid with family over weekend- this was started at their request as they felt  this was important to patient's comfort- I do not believe at this point this is making a difference in his comfort  Goals of Care and Additional Recommendations:  Limitations on Scope of Treatment: Full Comfort Care  Code Status:  DNR  Prognosis:   Hours - Days  Discharge Planning:  Anticipated Hospital Death  Care plan was discussed with  patient's family.  Thank you for allowing the Palliative Medicine Team to assist in the care of this patient.   Time In: 0900 Time Out: 1000 Total Time 60 minutes Prolonged Time Billed yes      Greater than 50%  of this time was spent counseling and coordinating care related to the above assessment and plan.  Mariana Kaufman, AGNP-C Palliative Medicine   Please contact Palliative Medicine Team phone at [phone] for questions and concerns.

## 2018-01-25 NOTE — Progress Notes (Signed)
CSW noting per chart review that plan is for patient to remain in house for end of life care; family does not wish to transition to residential hospice. Please consult CSW if plan changes and family would like to pursue hospice placement.  For now, CSW signing off.  Laveda Abbe, [place] Clinical Social Worker [phone]

## 2018-01-26 NOTE — Progress Notes (Signed)
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  He presents after just coming back to the [place] yesterday with a constellation of symptoms including fatigue, weakness, nausea, vomiting, coffee-ground emesis, declining functional status.   The goal for now is comfort directed care.  Palliative care input is highly appreciated.  01/25/2018: Comfort directed care.    01/26/2018: Patient remains comfortable.  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  Repeated CT head stable. Neurosurgery recommend further work up for encephalopathy if CT stable. Exam out of proportion for CT head finding. Patient didn't received sedatives. Ammonia level is elevated at 80. Start lactulose enema.  -EEG negative for seizure.  -continue with antibiotics to cover for SBP prophylaxis.  -CO 2 on B-met at 27, not elevated. For hypoxemia, repeated  chest x ray.  -Patient is DNR, DNI.  -IV synthroid.  -01/26/2018: Comfort directed care.    2-GI bleed, coffee ground emesis;  On IV protonix  BID Has NG tube.  CT  abdomen with gastric outlet obstruction.  GI consulted.  Received 2 units PRBC. 01/26/2018: Comfort directed care.  3-Acute blood loss anemia;  Related to GI bleed, related to malignancy.  Received 2 units PRBC>  Hb stable at 10/  01/26/2018: No further H/H check.   4-Pancreaticobiliary cancer;  treated with a Whipple in September 2018, followed by capecitabine + radiation, CT ; Metallic stent extending from the gastric antrum to the jejunum. Distended stomach, suggesting some degree of gastric outlet obstruction. Associated masslike thickening in the region of the gastric antrum/residual duodenum, reflecting tumor. Overlying peritoneal disease/omental caking beneath the anterior abdominal wall. Loculated malignant ascites with mass effect in the right upper abdomen. -Oncology consulted. Recommended comfort care, hospice  Transaminases, hyperbilirubinemia, related to malignancy  Awaiting palliative, care meeting.  Discussed with Dr Burr Medico today 10-01 , patient is not candidate for chemo, at time of discharge could remove NGT if not significant drainage and patient should be on clear diet. 01/25/2018: Comfort directed care.    5-Acute hypoxic respiratory failure;  On NB. Taper off oxygen as possible.  Repeated  chest x ray, clear.  Support.  DNR, DNI>     DVT prophylaxis: SCD. No anticoagulation given head bleed.  Code Status: DNR Family Communication: Son, daughter and wife.   Disposition Plan: Will depend on hospital course.    Consultants:   Oncology  Neurosurgery.    Gastroenterology  Palliative care team.   Procedures:   EEG; negative for seizure.    Antimicrobials:  Ceftriaxone discontinued.    Subjective: Patient is encephalopathic.   No history feasible from the patient today.    Objective: Vitals:  01/23/18 1513 01/24/18 0544 01/25/18 0302 01/26/18 0428  BP: 119/83 115/81 (!) 105/94 115/79  Pulse: 85 82 96 82  Resp: '16 16 18 16  '$ Temp: 98.1 F (36.7  C) (!) 97.4 F (36.3 C) 98.1 F (36.7 C) 98.2 F (36.8 C)  TempSrc: Oral Axillary Axillary Oral  SpO2: 100% 98% 98%   Weight:      Height:        Intake/Output Summary (Last 24 hours) at 01/26/2018 1724 Last data filed at 01/26/2018 1421 Gross per 24 hour  Intake 964.59 ml  Output 400 ml  Net 564.59 ml   Filed Weights   01/21/18 0344  Weight: 66 kg    Examination:  General exam: Encephalopathic.    Respiratory system: normal respiratory effort, no crackles.  Cardiovascular system: S 1, S 2 RRR Gastrointestinal system: BS present, soft, nt Central nervous system: Encephalopathic. Extremities: trace edema     Data Reviewed: I have personally reviewed following labs and imaging studies  CBC: Recent Labs  Lab 01/03/2018 1848 01/21/18 1132 01/21/18 1916  WBC 7.8 7.4 7.6  NEUTROABS  --  7.2  --   HGB 7.7* 10.2* 10.2*  HCT 26.6* 30.5* 29.9*  MCV 124.3* 97.1 96.1  PLT 126* 117* 98*   Basic Metabolic Panel: Recent Labs  Lab 01/14/2018 1848 01/21/18 1132  NA 137 138  K 3.5 3.2*  CL 100 97*  CO2 27 27  GLUCOSE 110* 96  BUN 7* 10  CREATININE 0.73 0.74  CALCIUM 7.8* 7.3*   GFR: Estimated Creatinine Clearance: 73.1 mL/min (by C-G formula based on SCr of 0.74 mg/dL). Liver Function Tests: Recent Labs  Lab 01/13/2018 1848 01/21/18 1132  AST 204* 160*  ALT 110* 96*  ALKPHOS 1,953* 1,583*  BILITOT 4.2* 4.4*  PROT 6.3* 5.8*  ALBUMIN 2.4* 2.1*   No results for input(s): LIPASE, AMYLASE in the last 168 hours. Recent Labs  Lab 01/15/2018 2121 01/21/18 1132  AMMONIA 87* 80*   Coagulation Profile: Recent Labs  Lab 01/11/2018 2121  INR 1.12   Cardiac Enzymes: No results for input(s): CKTOTAL, CKMB, CKMBINDEX, TROPONINI in the last 168 hours. BNP (last 3 results) No results for input(s): PROBNP in the last 8760 hours. HbA1C: No results for input(s): HGBA1C in the last 72 hours. CBG: Recent Labs  Lab 01/22/18 0526 01/22/18 0547 01/22/18 0808  01/22/18 1223 01/22/18 1604  GLUCAP 69* 167* 89 85 100*   Lipid Profile: No results for input(s): CHOL, HDL, LDLCALC, TRIG, CHOLHDL, LDLDIRECT in the last 72 hours. Thyroid Function Tests: No results for input(s): TSH, T4TOTAL, FREET4, T3FREE, THYROIDAB in the last 72 hours. Anemia Panel: No results for input(s): VITAMINB12, FOLATE, FERRITIN, TIBC, IRON, RETICCTPCT in the last 72 hours. Sepsis Labs: No results for input(s): PROCALCITON, LATICACIDVEN in the last 168 hours.  No results found for this or any previous visit (from the past 240 hour(s)).   Radiology Studies: No results found.  Scheduled Meds: . dexamethasone  4 mg Intravenous Q24H  . levothyroxine  75 mcg Intravenous Daily  . pantoprazole (PROTONIX) IV  40 mg Intravenous Q12H  . sodium chloride flush  3 mL Intravenous Q12H   Continuous Infusions: . famotidine (PEPCID) IV 20 mg (01/26/18 0944)  . octreotide  (SANDOSTATIN)    IV infusion 50 mcg/hr (01/26/18 1348)     LOS: 6 days    Time spent: 15 minutes.    Bonnell Public, MD [facility] Pager [phone]  If 7PM-7AM, please contact night-coverage [email or web address]  Password [email or web address] 01/26/2018, 5:24 PM

## 2018-01-26 NOTE — Plan of Care (Signed)
  Problem: Pain Managment: Goal: General experience of comfort will improve Outcome: Progressing   

## 2018-01-26 NOTE — Progress Notes (Signed)
Palliative:  I met today at Geoffrey Neal bedside with his son and daughter-in-law. They are very friendly and share that Geoffrey Neal is becoming less responsive today. He appears overall comfortable. Family believes that he has been resting comfortably. Daughter-in-law has been helping to turn and with chest percussion and secretions. Geoffrey Neal would not allow me to oral suction even with family encouragement. No complaints or concerns from family. They say that Geoffrey Neal has spent much time and has explained everything to them. They understand prognosis is poor and that Geoffrey Neal is expected at EOL and prognosis likely within days from pancreatic cancer and associated gastric outlet obstruction. Unfortunately he is not a candidate for hospice facility with sandostatin infusion (high symptom burden prior to initiation of sandostatin and necessary for comfort per my colleague).    Exam: Minimially responsive. Aphasic. No distress. Abd soft and NT.   25 min  Vinie Sill, NP Palliative Medicine Team Pager # [phone] (M-F 8a-5p) Team Phone # [phone] (Nights/Weekends)

## 2018-01-27 MED ORDER — MORPHINE 100MG IN NS 100ML (1MG/ML) PREMIX INFUSION
2.0000 mg/h | INTRAVENOUS | Status: DC
  Administered 2018-01-27: 2 mg/h via INTRAVENOUS
  Administered 2018-01-28: 5 mg/h via INTRAVENOUS
  Filled 2018-01-27 (×2): qty 100

## 2018-01-27 MED ORDER — MORPHINE BOLUS VIA INFUSION
2.0000 mg | INTRAVENOUS | Status: DC | PRN
  Filled 2018-01-27: qty 4

## 2018-01-27 MED ORDER — SODIUM CHLORIDE 0.9 % IV SOLN: INTRAVENOUS | Status: DC

## 2018-01-27 NOTE — Progress Notes (Signed)
Reviewed chart, examined patient, spoke with son and DIL at bedside.  DIL is a gastroenterologist.  Patient is tachycardic and he appears uncomfortable when he periodically refluxes up black foul smelling fluid.  Discussed increasing octreotide which DIL did not want to do as she felt it may hurt his kidneys.    Had frank discussion with son and DIL outside the room that the patient is dying.  He would be made more comfortable but more sleepy with a morphine gtt.  Son and DIL are agreeable to morphine gtt, however son wants to wait until siblings arrive later today to start morphine gtt.  Son requested that I be available to speak with his siblings.  Discussed morphine gtt to be started later today at son's request with bedside RN.  PE  Cachectically thin male, lethargic, will open eyes to voice CV tachycardic and regular Resp no distress Abdomen thin firm  Upper ext 2+ edema.  Recommendation:  Continue current care (Octreotide, protonix).  Initiate morphine gtt with ok with son.  Prognosis:  Hours to days.  Disposition:  Anticipate hospital death.  Florentina Jenny, PA-C Palliative Medicine Pager: [phone]  Total time 35 min.

## 2018-01-27 NOTE — Progress Notes (Addendum)
Son had concerns about father coughing, wanted him to have something to help. Explained Robinul every 4 hours was helping to dry secretions and we would continue to suction as needed.    Wife requested the Decadron be increased but I reminded her that was something the doctor could address with her in the morning, nurses cannot increase dosing.  Offered to reposition patient and recommend swabbing the patient's mouth more instead of giving as much oral fluids and it may help with the congestion.    Son asked why we did not do vitals as often for his father as the other patients, reminded him that comfort care tried to reduce all unpleasant stimulus to patient.  Patient appeared comfortable on assessment.   0656 family concerned about fever. Pt temps have been normal all shift however face is flushed. Patient burping often, malodorous.

## 2018-01-27 NOTE — Progress Notes (Signed)
Morphine gtt started per son`s request.

## 2018-01-27 NOTE — Plan of Care (Signed)
  Problem: Pain Managment: Goal: General experience of comfort will improve Outcome: Progressing   

## 2018-01-27 NOTE — Progress Notes (Signed)
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  He presents after just coming back to the [place] yesterday with a constellation of symptoms including fatigue, weakness, nausea, vomiting, coffee-ground emesis, declining functional status.   The goal for now is comfort directed care.  Palliative care input is highly appreciated.  01/27/2018: Comfort directed care.       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  Repeated CT head stable. Neurosurgery recommend further work up for encephalopathy if CT stable. Exam out of proportion for CT head finding. Patient didn't received sedatives. Ammonia level is elevated at 80. Start lactulose enema.  -EEG negative for seizure.  -continue with antibiotics to cover for SBP prophylaxis.  -CO 2 on B-met at 27, not elevated. For hypoxemia, repeated  chest x ray.  -Patient is DNR, DNI.  -IV synthroid.  -01/27/2018: Comfort directed care.    2-GI bleed, coffee ground emesis;  On IV protonix  BID Has NG tube.  CT abdomen with gastric outlet  obstruction.  GI consulted.  Received 2 units PRBC. 01/27/2018: Comfort directed care.  3-Acute blood loss anemia;  Related to GI bleed, related to malignancy.  Received 2 units PRBC>  Hb stable at 10/  01/27/2018: No further H/H check.   4-Pancreaticobiliary cancer;  treated with a Whipple in September 2018, followed by capecitabine + radiation, CT ; Metallic stent extending from the gastric antrum to the jejunum. Distended stomach, suggesting some degree of gastric outlet obstruction. Associated masslike thickening in the region of the gastric antrum/residual duodenum, reflecting tumor. Overlying peritoneal disease/omental caking beneath the anterior abdominal wall. Loculated malignant ascites with mass effect in the right upper abdomen. -Oncology consulted. Recommended comfort care, hospice  Transaminases, hyperbilirubinemia, related to malignancy  Awaiting palliative, care meeting.  Discussed with Dr Burr Medico today 10-01 , patient is not candidate for chemo, at time of discharge could remove NGT if not significant drainage and patient should be on clear diet. 01/27/2018: Comfort directed care.    5-Acute hypoxic respiratory failure;  On NB. Taper off oxygen as possible.  Repeated  chest x ray, clear.  Support.  DNR, DNI>     DVT prophylaxis: SCD. No anticoagulation given head bleed.  Code Status: DNR Family Communication: Son, daughter and wife.   Disposition Plan: Will depend on hospital course.    Consultants:   Oncology  Neurosurgery.    Gastroenterology  Palliative care team.   Procedures:   EEG; negative for seizure.    Antimicrobials:  Ceftriaxone discontinued.    Subjective: Patient is comfortable, and minimally responsive.    Objective: Vitals:   01/25/18 0302 01/26/18 0428 01/27/18 0534 01/27/18  0647  BP: (!) 105/94 115/79 (!) 135/95   Pulse: 96 82 84   Resp: _0 Temp: 98.1 F (36.7 C) 98.2 F (36.8 C) 98.4 F (36.9 C) 97.6 F (36.4 C)    TempSrc: Axillary Oral Axillary Axillary  SpO2: 98%     Weight:      Height:        Intake/Output Summary (Last 24 hours) at 01/27/2018 1309 Last data filed at 01/27/2018 0600 Gross per 24 hour  Intake 646.15 ml  Output 550 ml  Net 96.15 ml   Filed Weights   01/21/18 0344  Weight: 66 kg    Examination:  General exam: Minimally responsive.    Respiratory system: normal respiratory effort, no crackles.  Cardiovascular system: S 1, S 2 RRR Gastrointestinal system: BS present, soft, nt Central nervous system: Minimally responsive. Extremities: trace edema     Data Reviewed: I have personally reviewed following labs and imaging studies  CBC: Recent Labs  Lab 01/05/2018 1848 01/21/18 1132 01/21/18 1916  WBC 7.8 7.4 7.6  NEUTROABS  --  7.2  --   HGB 7.7* 10.2* 10.2*  HCT 26.6* 30.5* 29.9*  MCV 124.3* 97.1 96.1  PLT 126* 117* 98*   Basic Metabolic Panel: Recent Labs  Lab 01/07/2018 1848 01/21/18 1132  NA 137 138  K 3.5 3.2*  CL 100 97*  CO2 27 27  GLUCOSE 110* 96  BUN 7* 10  CREATININE 0.73 0.74  CALCIUM 7.8* 7.3*   GFR: Estimated Creatinine Clearance: 73.1 mL/min (by C-G formula based on SCr of 0.74 mg/dL). Liver Function Tests: Recent Labs  Lab 01/08/2018 1848 01/21/18 1132  AST 204* 160*  ALT 110* 96*  ALKPHOS 1,953* 1,583*  BILITOT 4.2* 4.4*  PROT 6.3* 5.8*  ALBUMIN 2.4* 2.1*   No results for input(s): LIPASE, AMYLASE in the last 168 hours. Recent Labs  Lab 12/23/2017 2121 01/21/18 1132  AMMONIA 87* 80*   Coagulation Profile: Recent Labs  Lab 01/19/2018 2121  INR 1.12   Cardiac Enzymes: No results for input(s): CKTOTAL, CKMB, CKMBINDEX, TROPONINI in the last 168 hours. BNP (last 3 results) No results for input(s): PROBNP in the last 8760 hours. HbA1C: No results for input(s): HGBA1C in the last 72 hours. CBG: Recent Labs  Lab 01/22/18 0526 01/22/18 0547 01/22/18 0808 01/22/18 1223 01/22/18 1604  GLUCAP 69* 167* 89 85 100*   Lipid  Profile: No results for input(s): CHOL, HDL, LDLCALC, TRIG, CHOLHDL, LDLDIRECT in the last 72 hours. Thyroid Function Tests: No results for input(s): TSH, T4TOTAL, FREET4, T3FREE, THYROIDAB in the last 72 hours. Anemia Panel: No results for input(s): VITAMINB12, FOLATE, FERRITIN, TIBC, IRON, RETICCTPCT in the last 72 hours. Sepsis Labs: No results for input(s): PROCALCITON, LATICACIDVEN in the last 168 hours.  No results found for this or any previous visit (from the past 240 hour(s)).   Radiology Studies: No results found.  Scheduled Meds: . dexamethasone  4 mg Intravenous Q24H  . pantoprazole (PROTONIX) IV  40 mg Intravenous Q12H  . sodium chloride flush  3 mL Intravenous Q12H   Continuous Infusions: . famotidine (PEPCID) IV 20 mg (01/27/18 1036)  . morphine    . octreotide  (SANDOSTATIN)    IV infusion 50 mcg/hr (01/27/18 1118)     LOS: 7 days    Time spent: 15 minutes.    Bonnell Public, MD [facility] Pager [phone]  If 7PM-7AM, please contact night-coverage [email or web address] Password [email or web address] 01/27/2018, 1:09 PM

## 2018-01-28 DIAGNOSIS — Z515 Encounter for palliative care: Secondary | ICD-10-CM

## 2018-02-22 NOTE — Discharge Summary (Signed)
Death Summary  Geoffrey Neal RDE:[id number] DOB: 11/09/1945 DOA: 01-21-18  PCP: [facility], [facility] PCP/Office notified:   Admit date: 2018-01-21 Date of Death: 01/29/2018  Final Diagnoses:  Active Problems:   Hypothyroidism   Cancer of common bile duct ([facility])   History of seizure   Gastrointestinal hemorrhage   Malignant neoplasm of pancreas ([place])   Acute hepatic encephalopathy   Palliative care by specialist   Advanced care planning/counseling discussion   Goals of care, counseling/discussion   Gastric outlet obstruction   SAH (subarachnoid hemorrhage) ([place])   Terminal care   Closed fracture of parietal bone of skull ([place])      72 year old man originally from [place] 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 He presented after just coming back to the [place] yesterday with a constellation of symptoms including fatigue, weakness, nausea, vomiting, coffee-ground emesis, declining functional status.   Patient was seen by oncology, palliative care. The goal was comfort directed care. Patient died on 01-29-2018 at 10.15 AM under comfort care. family at the bedside   Hospital Course:   Acute encephalopathy, multifactorial, hepatic encephalopathy, component of head bleed acute illness.  GI bleed, coffee ground emesis; Acute blood loss anemia;  Pancreaticobiliary cancer;  treated with a Whipple in September 2018, followed bycapecitabine +radiation, CT ; Metallic stent extending from the gastric antrum to the jejunum. Distended stomach, suggesting some degree of gastric outlet obstruction. Associated masslike thickening in the region of the gastric antrum/residual duodenum, reflecting tumor. Overlying peritoneal disease/omental caking beneath the anterior abdominal wall.  Loculated malignant ascites with mass effect in the right upper abdomen. Oncology consulted. Recommended comfort care, hospice  Acute hypoxic respiratory failure;    Time: 10.15AM  SignedKinnie Feil  [facility] 29-Jan-2018, 11:34 AM

## 2018-02-22 NOTE — Progress Notes (Addendum)
I went to [facility] and found the DIL crying in the hallway speaking to the Soil scientist.  DIL asked to speak with me privately.  We spoke for approximately 20 min. Regarding her stress, relationship with husband, desire to return to practicing gastroenterology.  I then went to console the family as Mr. Barberi had just passed away.  Son was at bedside with patient's wife. They were grateful for the care he received.  The patient had a difficult night.  He had continued to regurgitate black fluid until he passed despite our interventions.  Arrangements had been made for cremation.    Florentina Jenny, PA-C Palliative Medicine Pager: [phone]   Time 15 min.

## 2018-02-22 NOTE — Progress Notes (Signed)
   Feb 03, 2018 1200  Clinical Encounter Type  Visited With Health care provider  Referral From Nurse;Other (Comment) (charge RN\)   Spoke w/ care RN in reference to providing support to pt since he is actively dying per care team.  RN said she had asked about calling a chaplain and pt declined and per her said, "I am my own chaplain."  If pt or loved ones change mind, please page chaplain.  Myra Gianotti resident, [phone]

## 2018-02-22 NOTE — Plan of Care (Signed)
  Problem: Education: Goal: Knowledge of General Education information will improve Description Including pain rating scale, medication(s)/side effects and non-pharmacologic comfort measures Outcome: Progressing   Problem: Clinical Measurements: Goal: Will remain free from infection Outcome: Progressing Goal: Cardiovascular complication will be avoided Outcome: Progressing   Problem: Coping: Goal: Level of anxiety will decrease Outcome: Progressing   Problem: Elimination: Goal: Will not experience complications related to bowel motility Outcome: Progressing Goal: Will not experience complications related to urinary retention Outcome: Progressing   Problem: Safety: Goal: Ability to remain free from injury will improve Outcome: Progressing   Problem: Skin Integrity: Goal: Risk for impaired skin integrity will decrease Outcome: Progressing

## 2018-02-22 NOTE — Death Summary Note (Signed)
Death Summary  BRANTLEY WILEY QZR:[id number] DOB: 10/05/1945 DOA: 02-16-18  PCP: [facility], [facility] PCP/Office notified:   Admit date: 02-16-2018 Date of Death: 02-24-18  Final Diagnoses:  Active Problems:   Hypothyroidism   Cancer of common bile duct (HCC)   History of seizure   Gastrointestinal hemorrhage   Malignant neoplasm of pancreas ([place])   Acute hepatic encephalopathy   Palliative care by specialist   Advanced care planning/counseling discussion   Goals of care, counseling/discussion   Gastric outlet obstruction   SAH (subarachnoid hemorrhage) ([place])   Terminal care   Closed fracture of parietal bone of skull ([place])      History of present illness:   72 year old man originally from [place] 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 He presented after just coming back to the [place] yesterday with a constellation of symptoms including fatigue, weakness, nausea, vomiting, coffee-ground emesis, declining functional status.   Patient was seen by oncology, palliative care. The goal was comfort directed care. Patient died on 02-24-18 at 10.15 AM under comfort care. family at the bedside   Hospital Course:   Acute encephalopathy, multifactorial, hepatic encephalopathy, component of head bleed acute illness.  GI bleed, coffee ground emesis; Acute blood loss anemia;  Pancreaticobiliary cancer;  treated with a Whipple in September 2018, followed bycapecitabine +radiation, CT ; Metallic stent extending from the gastric antrum to the jejunum. Distended stomach, suggesting some degree of gastric outlet obstruction. Associated masslike thickening in the region of the gastric antrum/residual duodenum, reflecting tumor. Overlying peritoneal disease/omental caking beneath  the anterior abdominal wall. Loculated malignant ascites with mass effect in the right upper abdomen. Oncology consulted. Recommended comfort care, hospice  Acute hypoxic respiratory failure;    Time: 10.15AM  SignedKinnie Feil  [facility] 02/24/18, 11:31 AM

## 2018-02-22 NOTE — Progress Notes (Signed)
Patient is DNR, no pulse, no respirations, unresponsive, pooling of blood in feet and hands, next of kin at bedside, physician notified, Time of death 76 am, ME notified not qualified for autopsy, [place] donor services notified. Patient belongings given to family.

## 2018-02-22 DEATH — deceased

## 2019-06-16 IMAGING — CT CT HEAD W/O CM
4 series · 15 of 47 positions shown, 17 images · non-contrast
Comparison: Head CT scan 01/21/2018.

CLINICAL DATA: Patient unresponsive. Status post fall with an
intracranial hemorrhage yesterday.

EXAM:
CT HEAD WITHOUT CONTRAST
TECHNIQUE: Contiguous axial images were obtained from the base of the skull
through the vertex without intravenous contrast.

[Series 3: head wo · axial · 0.43mm/px · z∈[-139,+1]mm · 7 of 38 slices shown, 9 images]
[im 5/38  brain]
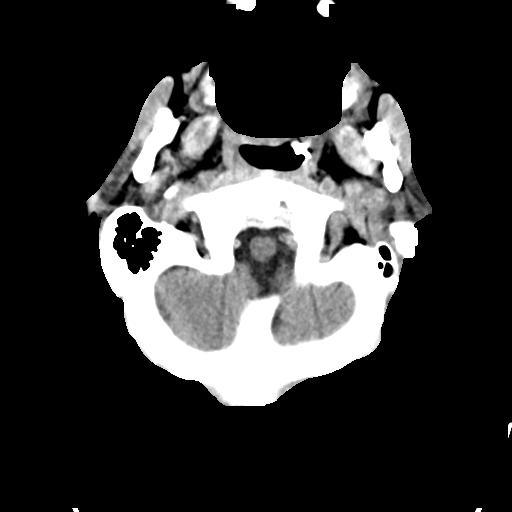
[im 5/38  bone]
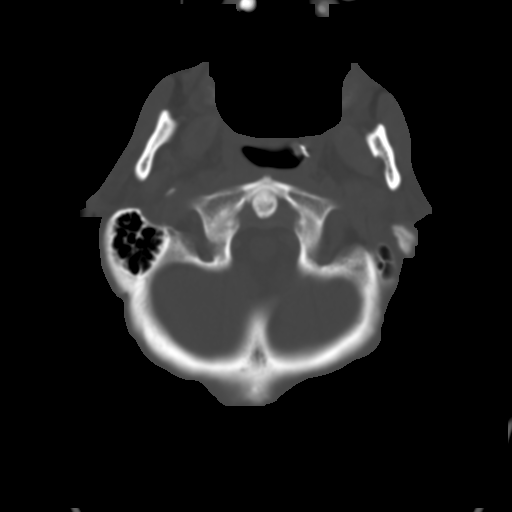
[im 10/38  brain]
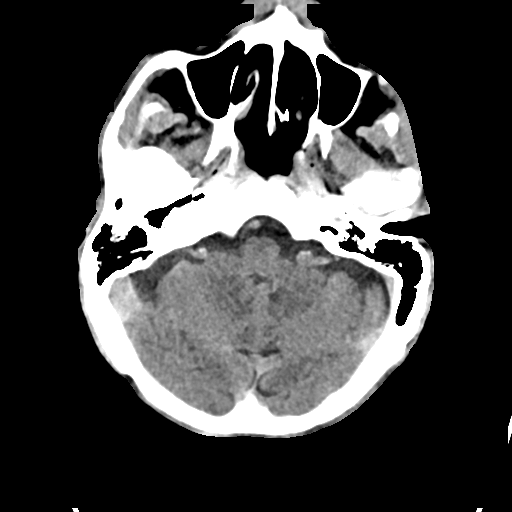
[im 14/38  brain]
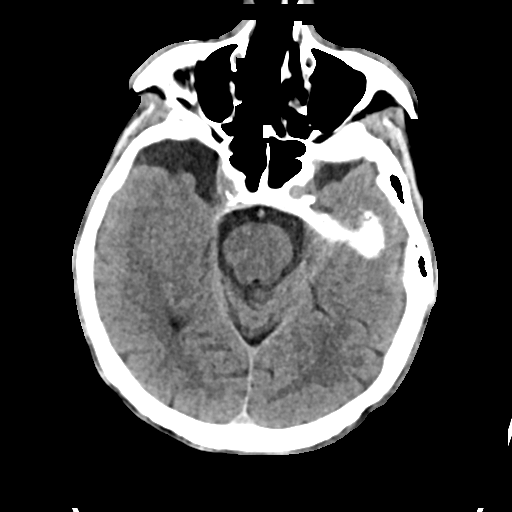
[im 19/38  brain]
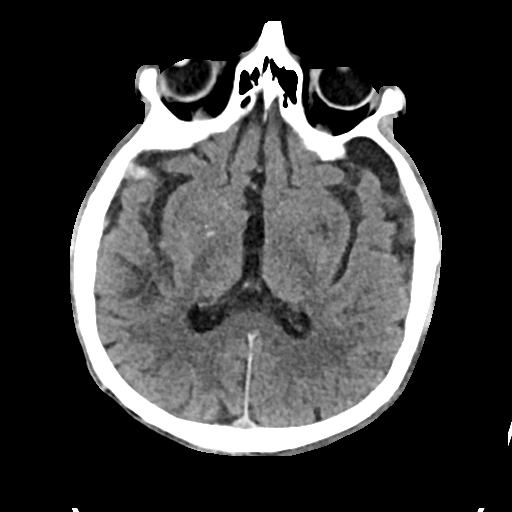
[im 24/38  brain]
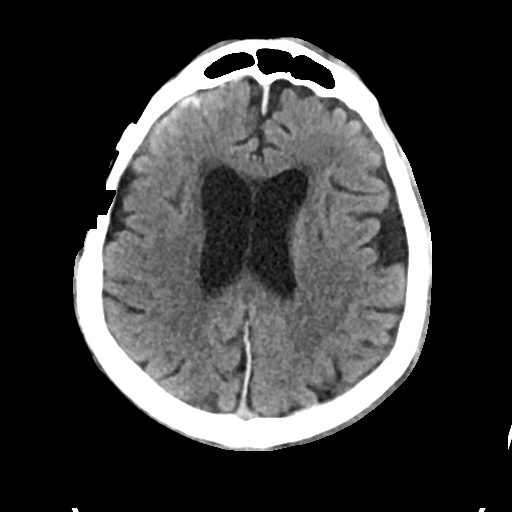
[im 24/38  bone]
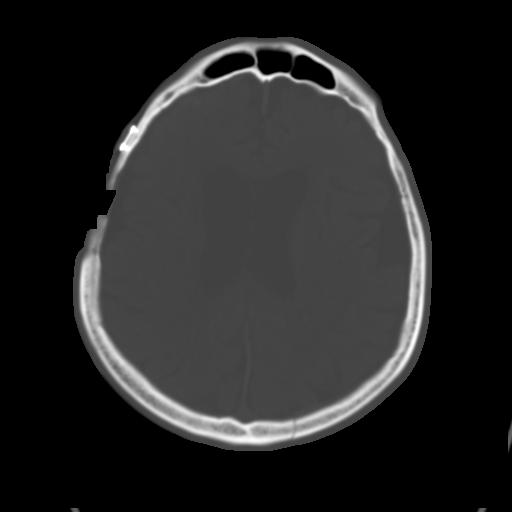
[im 28/38  brain]
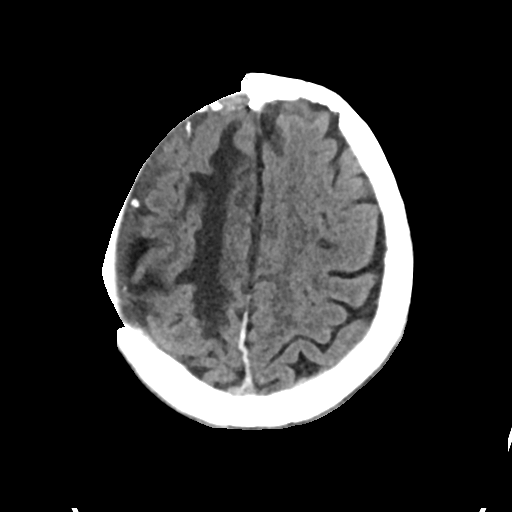
[im 33/38  brain]
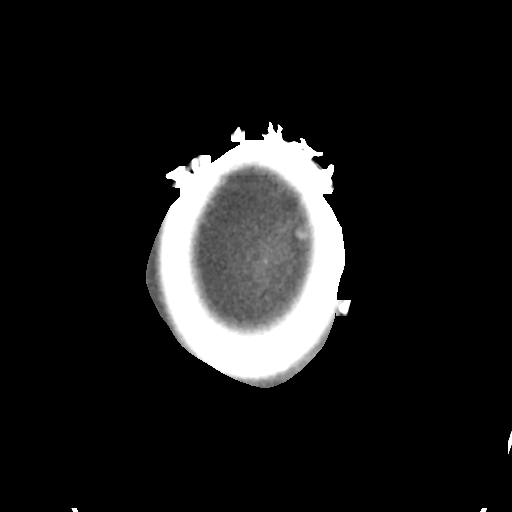

[Series 4: head bone · axial · 0.43mm/px · z∈[-141,-123]mm · 2 of 95 slices shown]
[im 10/95  bone]
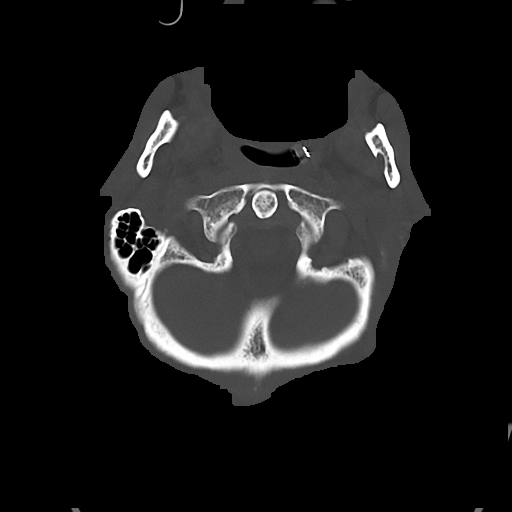
[im 19/95  bone]
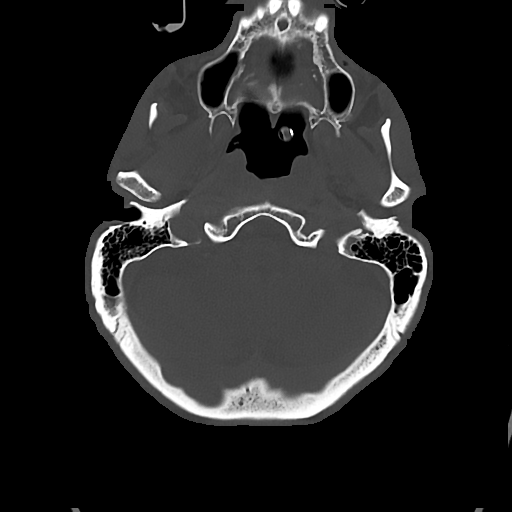

[Series 5: cor soft · coronal · 0.37mm/px · 3 of 74 slices shown]
[im 25/74  brain]
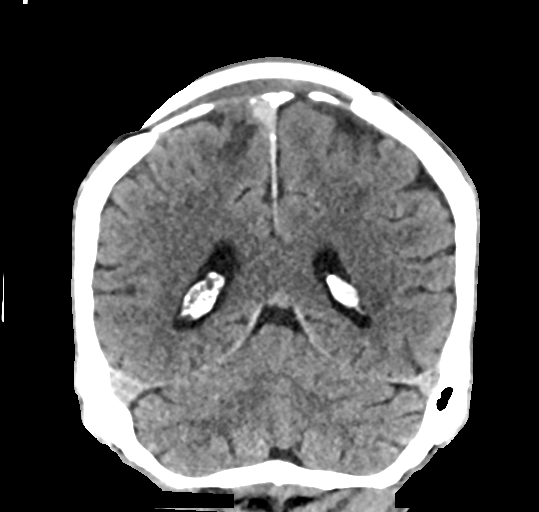
[im 33/74  brain]
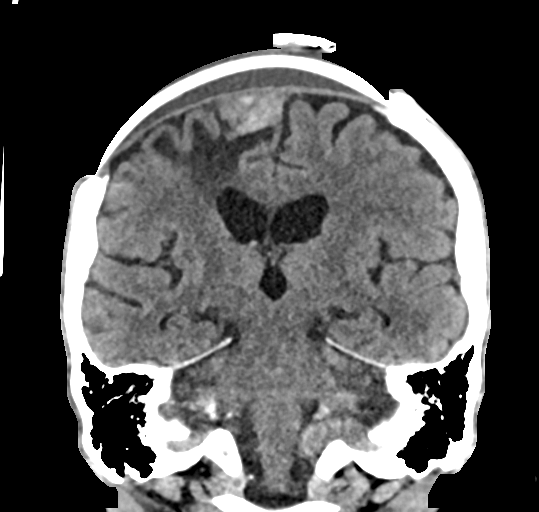
[im 41/74  brain]
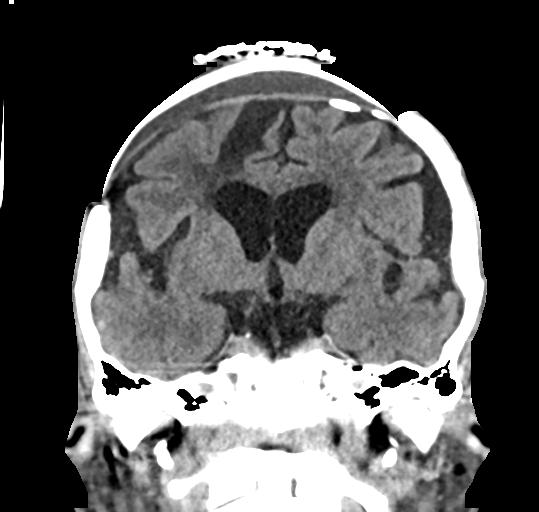

[Series 6: sag soft · sagittal · 0.37mm/px · 3 of 61 slices shown]
[im 21/61  brain]
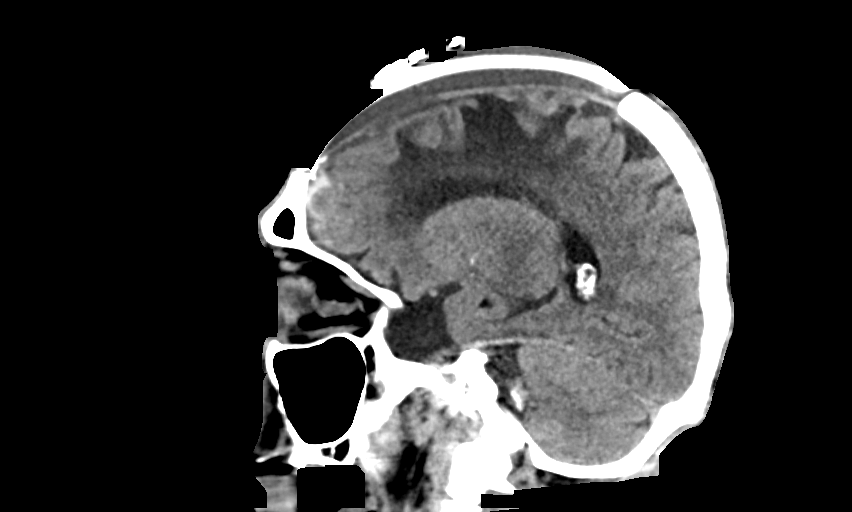
[im 31/61  brain]
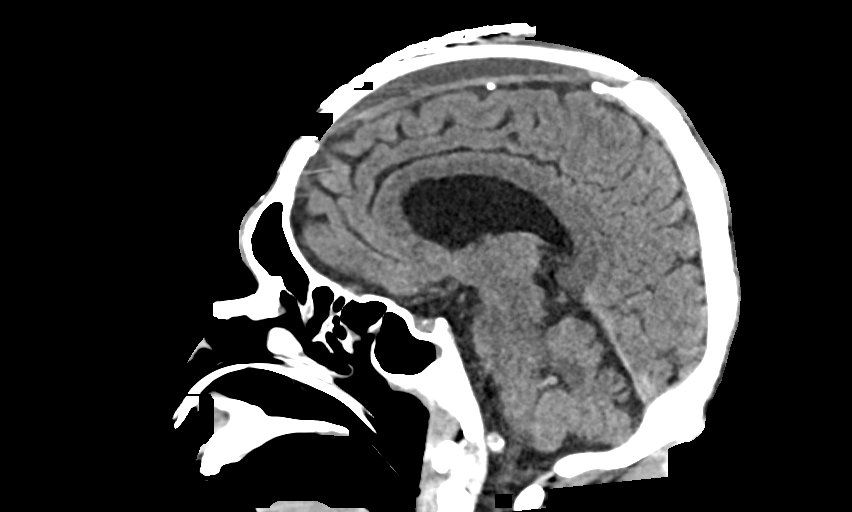
[im 41/61  brain]
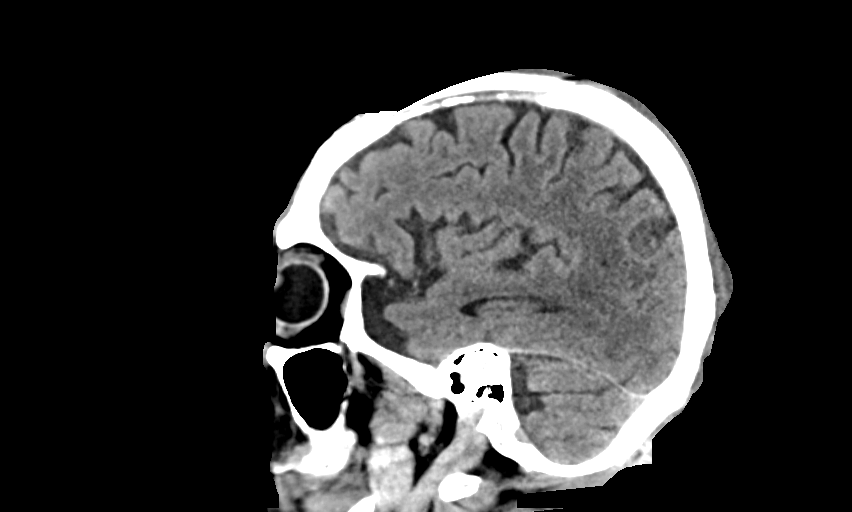

[15 of 47 positions shown; findings below may reference images not displayed]

FINDINGS: Brain: Small volume of subarachnoid hemorrhage in the right frontal
and temporal lobes is again seen and unchanged. Trace amount of
subdural hemorrhage over the high left parietal convexities is also
again seen. Right frontal lobe encephalomalacia with a small amount
of intraparenchymal and/or extra-axial hemorrhage along the superior
aspect of the frontal lobe are stable in appearance.

Vascular: Atherosclerosis noted.

Skull: Nondisplaced left parietal bone fracture is again seen.
Postoperative change cranioplasty on the right is noted with likely
chronic meningocele.

Sinuses/Orbits: Minimal mucosal thickening right maxillary sinus is
seen. Otherwise negative.

Other: None.
IMPRESSION: No new abnormality since the prior head CT. Nondisplaced left
parietal bone fracture, trace amount of subarachnoid hemorrhage on
the right and small volume of subdural or epidural hemorrhage
subjacent to the patient's fracture all appear unchanged.

Postoperative change on the right with right frontal
encephalomalacia. Small amount intraparenchymal and or extra-axial
hemorrhage overlying seen encephalomalacia appear unchanged.
